# Patient Record
Sex: Male | Born: 1940 | Race: Black or African American | Hispanic: No | Marital: Married | Smoking: Never smoker
Health system: Southern US, Community
[De-identification: ages and names within clinical notes are randomized; demographics above are authoritative.]

## PROBLEM LIST (undated history)

## (undated) DIAGNOSIS — H548 Legal blindness, as defined in USA: Secondary | ICD-10-CM

## (undated) DIAGNOSIS — N400 Enlarged prostate without lower urinary tract symptoms: Secondary | ICD-10-CM

## (undated) DIAGNOSIS — I714 Abdominal aortic aneurysm, without rupture, unspecified: Secondary | ICD-10-CM

## (undated) DIAGNOSIS — Z9889 Other specified postprocedural states: Secondary | ICD-10-CM

## (undated) DIAGNOSIS — R195 Other fecal abnormalities: Secondary | ICD-10-CM

## (undated) DIAGNOSIS — Z87442 Personal history of urinary calculi: Secondary | ICD-10-CM

## (undated) HISTORY — DX: Legal blindness, as defined in USA: H54.8

## (undated) HISTORY — DX: Abdominal aortic aneurysm, without rupture, unspecified: I71.40

## (undated) HISTORY — DX: Other specified postprocedural states: Z98.890

---
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
## 2005-03-27 HISTORY — PX: CATARACT EXTRACTION, BILATERAL: SHX1313

## 2015-10-30 ENCOUNTER — Ambulatory Visit (INDEPENDENT_AMBULATORY_CARE_PROVIDER_SITE_OTHER): Payer: Medicare HMO | Admitting: Urgent Care

## 2015-10-30 VITALS — BP 122/68 | HR 60 | Temp 98.1°F | Resp 16 | Ht 71.0 in | Wt 218.0 lb

## 2015-10-30 DIAGNOSIS — L089 Local infection of the skin and subcutaneous tissue, unspecified: Secondary | ICD-10-CM

## 2015-10-30 DIAGNOSIS — L723 Sebaceous cyst: Secondary | ICD-10-CM | POA: Diagnosis not present

## 2015-10-30 MED ORDER — DOXYCYCLINE HYCLATE 100 MG PO CAPS: 100.0000 mg | ORAL_CAPSULE | Freq: Two times a day (BID) | ORAL | 0 refills | Status: DC

## 2015-10-30 NOTE — Patient Instructions (Addendum)
Sebaceous Cyst Removal, Care After Refer to this sheet in the next few weeks. These instructions provide you with information about caring for yourself after your procedure. Your health care provider may also give you more specific instructions. Your treatment has been planned according to current medical practices, but problems sometimes occur. Call your health care provider if you have any problems or questions after your procedure. WHAT TO EXPECT AFTER THE PROCEDURE After your procedure, it is common to have:  Soreness in the area where your cyst was removed.  Tightness or itching from your skin sutures. HOME CARE INSTRUCTIONS  Take medicines only as directed by your health care provider.  If you were prescribed an antibiotic medicine, finish all of it even if you start to feel better.  Use antibiotic ointment as directed by your health care provider. Follow the instructions carefully.  There are many different ways to close and cover an incision, including stitches (sutures), skin glue, and adhesive strips. Follow your health care provider's instructions about:  Incision care.  Bandage (dressing) changes and removal.  Incision closure removal.  Keep the bandage (dressing) dry until your health care provider says that it can be removed. Take sponge baths only. Ask your health care provider when you can start showering or taking a bath.  After your dressing is off, check your incision every day for signs of infection. Watch for:  Redness, swelling, or pain.  Fluid, blood, or pus.  You can return to your normal activities. Do not do anything that stretches or puts pressure on your incision.  You can return to your normal diet.  Keep all follow-up visits as directed by your health care provider. This is important. SEEK MEDICAL CARE IF:  You have a fever.  Your incision bleeds.  You have redness, swelling, or pain in the incision area.  You have fluid, blood, or pus coming  from your incision.  Your cyst comes back after surgery.   This information is not intended to replace advice given to you by your health care provider. Make sure you discuss any questions you have with your health care provider.   Document Released: 04/03/2014 Document Reviewed: 04/03/2014 Elsevier Interactive Patient Education [employer].     IF you received an x-ray today, you will receive an invoice from [facility]. Please contact [facility] at [phone] with questions or concerns regarding your invoice.   IF you received labwork today, you will receive an invoice from [employer]. Please contact [facility] at [phone] with questions or concerns regarding your invoice.   Our billing staff will not be able to assist you with questions regarding bills from these companies.  You will be contacted with the lab results as soon as they are available. The fastest way to get your results is to activate your My Chart account. Instructions are located on the last page of this paperwork. If you have not heard from [place] regarding the results in 2 weeks, please contact this office.

## 2015-10-30 NOTE — Progress Notes (Signed)
    MRN: [id number] DOB: [birth year]  Subjective:   Bryan Morgan is a 75 y.o. male presenting for chief complaint of Abscess (abscess on back about a month ago. Area is draining)  Reports ~2 year history of mass over his left back. He has been seen before for the same at a different practice. He was advised to monitor the mass. It has since steadily increased in size, became red 4 days ago, started draining. Denies fever, pain, bleeding.  Bryan Morgan currently has no medications in their medication list. Also has No Known Allergies.  Bryan Morgan  has no past medical history on file. Also  has a past surgical history that includes Eye surgery (2007).  Objective:   Vitals: BP 122/68   Pulse 60   Temp 98.1 F (36.7 C) (Oral)   Resp 16   Ht 5\' 11"  (1.803 m)   Wt 218 lb (98.9 kg)   SpO2 99%   BMI 30.40 kg/m   Physical Exam  Constitutional: He is oriented to person, place, and time. He appears well-developed and well-nourished.  Cardiovascular: Normal rate.   Pulmonary/Chest: Effort normal.  Neurological: He is alert and oriented to person, place, and time.  Skin:      PROCEDURE NOTE: sebaceous cyst excision Verbal consent obtained. Local anesthesia with 5cc of 1% lidocaine with epinephrine. Sterile prep and drape. An elliptical incision was made extending ~2.5cm using an 11 blade, copious amount of purulent and sebaceous material was expressed, wound explored with curved hemostats with only remnants of the sac left over. The wound depth was between 3-4cm. Wound was packed with 1/2" packing. Cleansed and dressed.   Assessment and Plan :   1. Infected sebaceous cyst - Incision and drainage performed today, start doxycycline. Wound care reviewed, rtc in 2 days for recheck. Discussed prognosis with patient, verbalized understanding.  Jaynee Eagles, PA-C [facility] [phone] 10/30/2015 10:27 AM

## 2015-11-01 ENCOUNTER — Ambulatory Visit (INDEPENDENT_AMBULATORY_CARE_PROVIDER_SITE_OTHER): Payer: Medicare HMO | Admitting: Urgent Care

## 2015-11-01 VITALS — BP 126/80 | HR 88 | Temp 98.6°F | Resp 18 | Ht 73.0 in | Wt 218.0 lb

## 2015-11-01 DIAGNOSIS — Z5189 Encounter for other specified aftercare: Secondary | ICD-10-CM

## 2015-11-01 DIAGNOSIS — L089 Local infection of the skin and subcutaneous tissue, unspecified: Secondary | ICD-10-CM

## 2015-11-01 DIAGNOSIS — L723 Sebaceous cyst: Secondary | ICD-10-CM

## 2015-11-01 NOTE — Progress Notes (Signed)
    MRN: [id number] DOB: 1940-08-13  Subjective:   Bryan Morgan is a 75 y.o. male presenting for follow up on infected sebaceous cyst.   Patient was last seen 10/30/2015, had sebaceous cyst removal, with packing. He was started on doxycycline and has done well with this medication. His pain has improved, denies additional drainage but they have not changed the dressing at home. Denies fever, swelling, back pain.  Bryan Morgan has a current medication list which includes the following prescription(s): doxycycline. Also has No Known Allergies.  Bryan Morgan  has no past medical history on file. Also  has a past surgical history that includes Eye surgery (2007).  Objective:   Vitals: BP 126/80 (BP Location: Right Arm, Patient Position: Sitting, Cuff Size: Normal)   Pulse 88   Temp 98.6 F (37 C)   Resp 18   Ht 6\' 1"  (1.854 m)   Wt 218 lb (98.9 kg)   SpO2 96%   BMI 28.76 kg/m   Physical Exam  Constitutional: He is oriented to person, place, and time. He appears well-developed and well-nourished.  Cardiovascular: Normal rate.   Pulmonary/Chest: Effort normal.  Neurological: He is alert and oriented to person, place, and time.  Skin:       WOUND CARE: Dressing removed with moderate amount of dressing over dressing. Packing had mixture of purulent material and sebaceous cyst remnants. There was slight drainage that was expressed. Wound was irrigated with ~20cc of sterile water. Repacked with 1/2" packing. Cleansed and dressed.  Assessment and Plan :   1. Encounter for wound care 2. Infected sebaceous cyst - Improved, f/u in 2 days. Patient is to have a family member change dressing for him at home as instructed.  Jaynee Eagles, PA-C [facility] [phone] 11/01/2015 5:49 PM

## 2015-11-01 NOTE — Patient Instructions (Addendum)
Dressing Change A dressing is a material placed over wounds. It keeps the wound clean, dry, and protected from further injury. This provides an environment that favors wound healing.  BEFORE YOU BEGIN  Get your supplies together. Things you may need include:  Saline solution.  Flexible gauze dressing.  Medicated cream.  Tape.  Gloves.  Abdominal dressing pads.  Gauze squares.  Plastic bags.  Take pain medicine 30 minutes before the dressing change if you need it.  Take a shower before you do the first dressing change of the day. Use plastic wrap or a plastic bag to prevent the dressing from getting wet. REMOVING YOUR OLD DRESSING   Wash your hands with soap and water. Dry your hands with a clean towel.  Put on your gloves.  Remove any tape.  Carefully remove the old dressing. If the dressing sticks, you may dampen it with warm water to loosen it, or follow your caregiver's specific directions.  Remove any gauze or packing tape that is in your wound.  Take off your gloves.  Put the gloves, tape, gauze, or any packing tape into a plastic bag. CHANGING YOUR DRESSING  Open the supplies.  Take the cap off the saline solution.  Open the gauze package so that the gauze remains on the inside of the package.  Put on your gloves.  Clean your wound as told by your caregiver.  If you have been told to keep your wound dry, follow those instructions.  Your caregiver may tell you to do one or more of the following:  Pick up the gauze. Pour the saline solution over the gauze. Squeeze out the extra saline solution.  Put medicated cream or other medicine on your wound if you have been told to do so.  Put the solution soaked gauze only in your wound, not on the skin around it.  Pack your wound loosely or as told by your caregiver.  Put dry gauze on your wound.  Put abdominal dressing pads over the dry gauze if your wet gauze soaks through.  Tape the abdominal dressing  pads in place so they will not fall off. Do not wrap the tape completely around the affected part (arm, leg, abdomen).  Wrap the dressing pads with a flexible gauze dressing to secure it in place.  Take off your gloves. Put them in the plastic bag with the old dressing. Tie the bag shut and throw it away.  Keep the dressing clean and dry until your next dressing change.  Wash your hands. SEEK MEDICAL CARE IF:  Your skin around the wound looks red.  Your wound feels more tender or sore.  You see pus in the wound.  Your wound smells bad.  You have a fever.  Your skin around the wound has a rash that itches and burns.  You see black or yellow skin in your wound that was not there before.  You feel nauseous, throw up, and feel very tired.   This information is not intended to replace advice given to you by your health care provider. Make sure you discuss any questions you have with your health care provider.   Document Released: 04/20/2004 Document Revised: 06/05/2011 Document Reviewed: 01/23/2011 Elsevier Interactive Patient Education 2016 [employer].     IF you received an x-ray today, you will receive an invoice from [facility]. Please contact [facility] at [phone] with questions or concerns regarding your invoice.   IF you received labwork today, you will receive  an Pharmacologist from [employer]. Please contact [facility] at [phone] with questions or concerns regarding your invoice.   Our billing staff will not be able to assist you with questions regarding bills from these companies.  You will be contacted with the lab results as soon as they are available. The fastest way to get your results is to activate your My Chart account. Instructions are located on the last page of this paperwork. If you have not heard from [place] regarding the results in 2 weeks, please contact this office.

## 2015-11-03 ENCOUNTER — Ambulatory Visit (INDEPENDENT_AMBULATORY_CARE_PROVIDER_SITE_OTHER): Payer: Medicare HMO | Admitting: Physician Assistant

## 2015-11-03 VITALS — BP 126/80 | HR 81 | Temp 98.2°F | Resp 17 | Ht 73.5 in | Wt 220.0 lb

## 2015-11-03 DIAGNOSIS — Z5189 Encounter for other specified aftercare: Secondary | ICD-10-CM

## 2015-11-03 DIAGNOSIS — L089 Local infection of the skin and subcutaneous tissue, unspecified: Secondary | ICD-10-CM

## 2015-11-03 DIAGNOSIS — L723 Sebaceous cyst: Secondary | ICD-10-CM

## 2015-11-03 DIAGNOSIS — Z4801 Encounter for change or removal of surgical wound dressing: Secondary | ICD-10-CM

## 2015-11-03 NOTE — Progress Notes (Signed)
[facility] [address] [place] [address] [phone]  By signing my name below I, Bryan Morgan, attest that this documentation has been prepared under the direction and in the presence of Ivar Drape PA. Electonically Signed. Bryan Morgan, Scribe 11/03/2015 at 5:28 PM  Date:  11/03/2015   Name:  Bryan Morgan   DOB:  [birth year]   MRN:  [id number]  PCP:  No primary care provider on file.    History of Present Illness:  Bryan Morgan is a 75 y.o. male patient who presents to [facility] for follow up of an infected sebaceous cyst. Pt was initially seen 4 days ago, complaining of an abscess at that time. Had sebaceous cyst removal with packing, started on doxycycline. Last follow up was 2 days ago. Pain was improved at that visit. Wound was repacked.     Pt has been taking doxycycline everyday, twice a day. His wife has been helping him change his bandages, last changed last night. No drainage noted. Pt denies fever and chills.  There are no active problems to display for this patient.   No past medical history on file.  Past Surgical History:  Procedure Laterality Date   EYE SURGERY  2007    Social History  Substance Use Topics   Smoking status: Never Smoker   Smokeless tobacco: Never Used   Alcohol use No    No family history on file.  No Known Allergies  Medication list has been reviewed and updated.  Current Outpatient Prescriptions on File Prior to Visit  Medication Sig Dispense Refill   doxycycline (VIBRAMYCIN) 100 MG capsule Take 1 capsule (100 mg total) by mouth 2 (two) times daily. 20 capsule 0   No current facility-administered medications on file prior to visit.     ROS ROS unremarkable unless otherwise specified.  Physical Examination: BP 126/80 (BP Location: Right Arm, Patient Position: Sitting, Cuff Size: Normal)    Pulse 81    Temp 98.2 F (36.8 C) (Oral)    Resp 17    Ht 6' 1.5" (1.867 m)    Wt 220 lb (99.8 kg)     SpO2 96%    BMI 28.63 kg/m  Ideal Body Weight: @FLOWAMB [id number]  Physical Exam  Constitutional: He is oriented to person, place, and time. He appears well-developed and well-nourished. No distress.  HENT:  Head: Normocephalic and atraumatic.  Eyes: Conjunctivae and EOM are normal. Pupils are equal, round, and reactive to light.  Cardiovascular: Normal rate.   Pulmonary/Chest: Effort normal. No respiratory distress.  Neurological: He is alert and oriented to person, place, and time.  Skin: Skin is warm and dry. He is not diaphoretic.  Incision site with mild localized erythema.  Wound without necrosis.  Sanguinous fluid only.  Depth of wound is a little less than 1.5 cm.    Psychiatric: He has a normal mood and affect. His behavior is normal.   Wound irrigated with normal saline.  1/2 packing gently and dressings applied.  Assessment and Plan: Bryan Morgan is a 75 y.o. male who is here today.  He will continue the doxy.  Advised to return in 3 days for follow up.  Encounter for wound care  Infected sebaceous cyst    Ivar Drape, PA-C [facility] 11/03/2015 5:28 PM

## 2015-11-03 NOTE — Patient Instructions (Addendum)
Continue the doxycycline.   Change the dressing daily.  Return in 3 days for wound care.  IF you received an x-ray today, you will receive an invoice from [facility]. Please contact [facility] at [phone] with questions or concerns regarding your invoice.   IF you received labwork today, you will receive an invoice from [employer]. Please contact [facility] at [phone] with questions or concerns regarding your invoice.   Our billing staff will not be able to assist you with questions regarding bills from these companies.  You will be contacted with the lab results as soon as they are available. The fastest way to get your results is to activate your My Chart account. Instructions are located on the last page of this paperwork. If you have not heard from [place] regarding the results in 2 weeks, please contact this office.

## 2015-11-06 ENCOUNTER — Ambulatory Visit (INDEPENDENT_AMBULATORY_CARE_PROVIDER_SITE_OTHER): Payer: Medicare HMO | Admitting: Physician Assistant

## 2015-11-06 VITALS — BP 110/60 | HR 63 | Temp 97.5°F | Resp 18 | Ht 73.0 in | Wt 219.0 lb

## 2015-11-06 DIAGNOSIS — Z5189 Encounter for other specified aftercare: Secondary | ICD-10-CM

## 2015-11-06 DIAGNOSIS — Z4800 Encounter for change or removal of nonsurgical wound dressing: Secondary | ICD-10-CM

## 2015-11-06 NOTE — Progress Notes (Signed)
   11/06/2015 8:24 AM   DOB: 1940-08-26 / MRN: [id number]  SUBJECTIVE:  Mr. Arritt is a well appearing 75 y.o. male here today for wound care. He denies exquisite tenderness at the site of the wound, nausea, emesis, fever and chills.  He has been compliant with medical therapy and recommendations thus far.   He has No Known Allergies.   He  has no past medical history on file.    He  reports that he has never smoked. He has never used smokeless tobacco. He reports that he does not drink alcohol or use drugs. He  reports that he does not engage in sexual activity. The patient  has a past surgical history that includes Eye surgery (2007).  His family history is not on file.  ROS  Per HPI  Problem list and medications reviewed and updated by myself where necessary, and exist elsewhere in the encounter.   OBJECTIVE:  BP 110/60 (BP Location: Left Arm, Patient Position: Sitting, Cuff Size: Large)   Pulse 63   Temp 97.5 F (36.4 C) (Oral)   Resp 18   Ht 6\' 1"  (1.854 m)   Wt 219 lb (99.3 kg)   SpO2 98%   BMI 28.89 kg/m  CrCl cannot be calculated (No order found.).  Physical Exam  Constitutional: Vital signs are normal.      No results found for this or any previous visit (from the past 48 hour(s)).  ASSESSMENT AND PLAN  Gavyn was seen today for wound check.  Diagnoses and all orders for this visit:  Encounter for wound care: Advised he pull packing in two days, finish ABx and RTC PRN.  He is amenable to this plan.       The patient was advised to call or return to clinic if he does not see an improvement in symptoms or to seek the care of the closest emergency department if he worsens with the above plan.   Philis Fendt, MHS, PA-C [facility] 11/06/2015 8:24 AM

## 2015-11-06 NOTE — Patient Instructions (Signed)
     IF you received an x-ray today, you will receive an invoice from [facility]. Please contact  Radiology at [phone] with questions or concerns regarding your invoice.   IF you received labwork today, you will receive an invoice from [facility]. Please contact [facility] at [phone] with questions or concerns regarding your invoice.   Our billing staff will not be able to assist you with questions regarding bills from these companies.  You will be contacted with the lab results as soon as they are available. The fastest way to get your results is to activate your My Chart account. Instructions are located on the last page of this paperwork. If you have not heard from us regarding the results in 2 weeks, please contact this office.      

## 2016-01-01 ENCOUNTER — Ambulatory Visit (INDEPENDENT_AMBULATORY_CARE_PROVIDER_SITE_OTHER): Payer: Medicare HMO | Admitting: Physician Assistant

## 2016-01-01 VITALS — BP 128/80 | HR 74 | Temp 98.3°F | Resp 17 | Ht 73.0 in | Wt 218.0 lb

## 2016-01-01 DIAGNOSIS — S21202D Unspecified open wound of left back wall of thorax without penetration into thoracic cavity, subsequent encounter: Secondary | ICD-10-CM

## 2016-01-01 DIAGNOSIS — L929 Granulomatous disorder of the skin and subcutaneous tissue, unspecified: Secondary | ICD-10-CM

## 2016-01-01 MED ORDER — DOXYCYCLINE HYCLATE 100 MG PO CAPS: 100.0000 mg | ORAL_CAPSULE | Freq: Two times a day (BID) | ORAL | 0 refills | Status: DC

## 2016-01-01 NOTE — Patient Instructions (Addendum)
  Follow up in one week.  Take antibiotics as prescribed.  If you develop fever, chills, or any concerning symptoms, follow up sooner.    IF you received an x-ray today, you will receive an invoice from [facility]. Please contact [facility] at [phone] with questions or concerns regarding your invoice.   IF you received labwork today, you will receive an invoice from [employer]. Please contact [facility] at [phone] with questions or concerns regarding your invoice.   Our billing staff will not be able to assist you with questions regarding bills from these companies.  You will be contacted with the lab results as soon as they are available. The fastest way to get your results is to activate your My Chart account. Instructions are located on the last page of this paperwork. If you have not heard from [place] regarding the results in 2 weeks, please contact this office.

## 2016-01-01 NOTE — Progress Notes (Signed)
    MRN: [id number] DOB: May 07, 1940  Subjective:   Bryan Morgan is a 75 y.o. male presenting for follow up on wound. Initially seen on 10/30/15 for infected sebaceous cyst I&D. He then followed up for three visits for wound repacking. Notes that two days after his last visit for packing removal on 11/06/15, the wound cavity started bleeding and having pus drainage and has slowly kept draining since this visit.  He denies fever, chills, diaphoresis, or pain.   Bryan Morgan currently has no medications in their medication list. Also has No Known Allergies.  Bryan Morgan  has no past medical history on file. Also  has a past surgical history that includes Eye surgery (2007).  Objective:   Vitals: BP 128/80 (BP Location: Right Arm, Patient Position: Sitting, Cuff Size: Normal)   Pulse 74   Temp 98.3 F (36.8 C) (Oral)   Resp 17   Ht 6\' 1"  (1.854 m)   Wt 218 lb (98.9 kg)   SpO2 98%   BMI 28.76 kg/m   Physical Exam  Constitutional: He is oriented to person, place, and time. He appears well-developed and well-nourished.  HENT:  Head: Normocephalic and atraumatic.  Eyes: Conjunctivae are normal.  Neck: Normal range of motion.  Pulmonary/Chest: Effort normal.  Neurological: He is alert and oriented to person, place, and time.  Skin: Skin is warm and dry.     Psychiatric: He has a normal mood and affect.  Vitals reviewed.   Wound Care:  Consent obtained from patient. Area cleansed with alcohol pad. 1.5cc of 1% lidocaine without epinephrine used for local anesthesia. Granulation tissue cauterized. Curette used to debride remaining tissue above epidermis.Area cauterized once more. Wound edges were disrupted using curette to allow for proper healing. Area cleansed and dressed. Patient tolerated well.    No results found for this or any previous visit (from the past 24 hour(s)).  Assessment and Plan :  1. Granulation tissue -Debrided and cauterized  2. Wound of left side of back, subsequent  encounter - doxycycline (VIBRAMYCIN) 100 MG capsule; Take 1 capsule (100 mg total) by mouth 2 (two) times daily.  Dispense: 20 capsule; Refill: 0 - WOUND CULTURE -Pt to return in one week for reevaluation -Wound care instructions given  A total of 25 minutes was spent in the room with the patient, greater than 50% of which was spent in wound care.    Tenna Delaine, PA-C  [facility] 01/01/2016 8:07 AM

## 2016-01-03 LAB — WOUND CULTURE
Gram Stain: NONE SEEN
ORGANISM ID, BACTERIA: NO GROWTH

## 2016-01-05 NOTE — Progress Notes (Signed)
Message left

## 2016-01-08 ENCOUNTER — Ambulatory Visit (INDEPENDENT_AMBULATORY_CARE_PROVIDER_SITE_OTHER): Payer: Medicare HMO | Admitting: Physician Assistant

## 2016-01-08 DIAGNOSIS — Z5189 Encounter for other specified aftercare: Secondary | ICD-10-CM

## 2016-01-08 NOTE — Progress Notes (Signed)
Patient ID: Bryan Morgan, male   DOB: 1940-06-19, 75 y.o.   MRN: [id number] [facility] [address], [place] [address] [phone]  By signing my name below, I, Bryan Morgan, attest that this documentation has been prepared under the direction and in the presence of Bryan Drape, PA-C Electronically Signed: Ladene Artist, ED Scribe 01/08/2016 at 8:10 AM.   Date:  01/08/2016   Name:  BREYDON Morgan   DOB:  08-19-1940   MRN:  [id number]  PCP:  No primary care provider on file.   History of Present Illness:  Bryan Morgan is a 75 y.o. male patient who [facility] to [facility] for a wound check. Pt was seen on 10/30/15 for an infected sebaceous cyst on the left back. It was repacked for 7 days. At the last visit 1 week ago, pt stated that for 1.5 month he encountered  beleding and pus-drainage. Wound was found to have granulation tissue and it was debrided and cauterized. He returns for f/u. Today, pt states that he has been cleaning the area with soap and water. He denies pain at this time.   There are no active problems to display for this patient.   No past medical history on file.  Past Surgical History:  Procedure Laterality Date   EYE SURGERY  2007    Social History  Substance Use Topics   Smoking status: Never Smoker   Smokeless tobacco: Never Used   Alcohol use No    No family history on file.  No Known Allergies  Medication list has been reviewed and updated.  Current Outpatient Prescriptions on File Prior to Visit  Medication Sig Dispense Refill   doxycycline (VIBRAMYCIN) 100 MG capsule Take 1 capsule (100 mg total) by mouth 2 (two) times daily. 20 capsule 0   No current facility-administered medications on file prior to visit.     Review of Systems  Skin:       +wound to L back    Physical Examination: There were no vitals taken for this visit. Ideal Body Weight: @FLOWAMB [id number]  Physical Exam   Constitutional: He is oriented to person, place, and time. He appears well-developed and well-nourished. No distress.  HENT:  Head: Normocephalic and atraumatic.  Eyes: Conjunctivae and EOM are normal. Pupils are equal, round, and reactive to light.  Cardiovascular: Normal rate.   Pulmonary/Chest: Effort normal. No respiratory distress.  Neurological: He is alert and oriented to person, place, and time.  Skin: Skin is warm and dry. He is not diaphoretic.  Dried non-erythematous area without exudate. No tenderness.   Psychiatric: He has a normal mood and affect. His behavior is normal.    Assessment and Plan: [name] is a 75 y.o. male who is here today for wound care.  This is healing well.  Advised to clean daily and he can remove bandaging once it fully scabs over. rtc as needed.  Encounter for wound care  Bryan Drape, PA-C [facility] 10/18/20174:37 PM I personally performed the services described in this documentation, which was scribed in my presence. The recorded information has been reviewed and is accurate.

## 2016-01-12 ENCOUNTER — Encounter: Payer: Self-pay | Admitting: Physician Assistant

## 2018-07-31 DIAGNOSIS — E663 Overweight: Secondary | ICD-10-CM | POA: Insufficient documentation

## 2018-08-01 NOTE — Progress Notes (Deleted)
ENCOUNTER TO ESTABLISH CARE AND MEDICARE ANNUAL WELLNESS VISIT  Assessment:   Diagnoses and all orders for this visit:  Encounter to establish care with new doctor  Encounter for Medicare annual wellness exam  Overweight (BMI 25.0-29.9)     Over 30 minutes of exam, counseling, chart review, and critical decision making was performed  Future Appointments  Date Time Provider [place]  08/05/2018  9:30 AM Liane Comber, NP [facility] None     Plan:   During the course of the visit the patient was educated and counseled about appropriate screening and preventive services including:    Pneumococcal vaccine   Influenza vaccine  Prevnar 13  Td vaccine  Screening electrocardiogram  Colorectal cancer screening  Diabetes screening  Glaucoma screening  Nutrition counseling    Subjective:  Bryan Morgan is a 78 y.o. male who presents for to establish care and for Medicare Annual Wellness Visit.  CPE with Dr. Jerilynn Mages 6 months? ***  BMI is There is no height or weight on file to calculate BMI., he {HAS HAS GLO:75643} been working on diet and exercise. Wt Readings from Last 3 Encounters:  01/01/16 218 lb (98.9 kg)  11/06/15 219 lb (99.3 kg)  11/03/15 220 lb (99.8 kg)   His blood pressure {HAS HAS NOT:18834} been controlled at home, today their BP is   He {DOES_DOES PIR:51884} workout. He denies chest pain, shortness of breath, dizziness.   He {ACTION; IS/IS ZYS:06301601} on cholesterol medication and denies myalgias. His cholesterol {ACTION; IS/IS NOT:21021397} at goal. The cholesterol last visit was:  No results found for: CHOL, HDL, LDLCALC, LDLDIRECT, TRIG, CHOLHDL He {Has/has not:18111} been working on diet and exercise for ***prediabetes, and denies {Symptoms; diabetes w/o none:19199}. Last A1C in the office was: No results found for: HGBA1C Last GFR No results found for: GFRNONAA  No results found for: GFRAA Patient is on Vitamin D supplement.   No  results found for: VD25OH    Medication Review:       Current Outpatient Medications (Other):  .  doxycycline (VIBRAMYCIN) 100 MG capsule, Take 1 capsule (100 mg total) by mouth 2 (two) times daily.  Allergies: No Known Allergies  Current Problems (verified) has Overweight (BMI 25.0-29.9) on their problem list.  Screening Tests  There is no immunization history on file for this patient.  Preventative care: Last colonoscopy: ***  Prior vaccinations: TD or Tdap: ***  Influenza: ***  Pneumococcal: *** Prevnar13:  Shingles/Zostavax: ***  Names of Other Physician/Practitioners you currently use: 1. [facility] here for primary care 2. ***, eye doctor, last visit  3. ***, dentist, last visit  Patient Care Team: Unk Pinto, MD as PCP - General (Internal Medicine)  Surgical: He  has a past surgical history that includes Eye surgery (2007). Family His family history is not on file. Social history  He reports that he has never smoked. He has never used smokeless tobacco. He reports that he does not drink alcohol or use drugs.  MEDICARE WELLNESS OBJECTIVES: Physical activity:   Cardiac risk factors:   Depression/mood screen:   Depression screen [place] 2/9 01/01/2016  Decreased Interest 0  Down, Depressed, Hopeless 0  PHQ - 2 Score 0    ADLs:  No flowsheet data found.   Cognitive Testing  Alert? Yes  Normal Appearance?Yes  Oriented to person? Yes  Place? Yes   Time? Yes  Recall of three objects?  Yes  Can perform simple calculations? Yes  Displays appropriate  judgment?Yes  Can read the correct time from a watch face?Yes  EOL planning:     Objective:   There were no vitals filed for this visit. There is no height or weight on file to calculate BMI.  General appearance: alert, no distress, WD/WN, male HEENT: normocephalic, sclerae anicteric, TMs pearly, nares patent, no discharge or erythema, pharynx normal Oral  cavity: MMM, no lesions Neck: supple, no lymphadenopathy, no thyromegaly, no masses Heart: RRR, normal S1, S2, no murmurs Lungs: CTA bilaterally, no wheezes, rhonchi, or rales Abdomen: +bs, soft, non tender, non distended, no masses, no hepatomegaly, no splenomegaly Musculoskeletal: nontender, no swelling, no obvious deformity Extremities: no edema, no cyanosis, no clubbing Pulses: 2+ symmetric, upper and lower extremities, normal cap refill Neurological: alert, oriented x 3, CN2-12 intact, strength normal upper extremities and lower extremities, sensation normal throughout, DTRs 2+ throughout, no cerebellar signs, gait normal Psychiatric: normal affect, behavior normal, pleasant   Medicare Attestation I have personally reviewed: The patient's medical and social history Their use of alcohol, tobacco or illicit drugs Their current medications and supplements The patient's functional ability including ADLs,fall risks, home safety risks, cognitive, and hearing and visual impairment Diet and physical activities Evidence for depression or mood disorders  The patient's weight, height, BMI, and visual acuity have been recorded in the chart.  I have made referrals, counseling, and provided education to the patient based on review of the above and I have provided the patient with a written personalized care plan for preventive services.     Izora Ribas, NP   08/01/2018

## 2018-08-05 ENCOUNTER — Ambulatory Visit: Payer: Medicare HMO | Admitting: Adult Health

## 2018-08-05 ENCOUNTER — Other Ambulatory Visit: Payer: Self-pay

## 2018-08-05 ENCOUNTER — Encounter: Payer: Self-pay | Admitting: Adult Health

## 2018-08-05 VITALS — BP 118/76 | HR 80 | Temp 97.7°F | Ht 73.0 in | Wt 224.8 lb

## 2018-08-05 DIAGNOSIS — R7309 Other abnormal glucose: Secondary | ICD-10-CM | POA: Diagnosis not present

## 2018-08-05 DIAGNOSIS — E559 Vitamin D deficiency, unspecified: Secondary | ICD-10-CM

## 2018-08-05 DIAGNOSIS — R972 Elevated prostate specific antigen [PSA]: Secondary | ICD-10-CM | POA: Insufficient documentation

## 2018-08-05 DIAGNOSIS — E785 Hyperlipidemia, unspecified: Secondary | ICD-10-CM | POA: Diagnosis not present

## 2018-08-05 DIAGNOSIS — Z1211 Encounter for screening for malignant neoplasm of colon: Secondary | ICD-10-CM

## 2018-08-05 DIAGNOSIS — R6889 Other general symptoms and signs: Secondary | ICD-10-CM | POA: Diagnosis not present

## 2018-08-05 DIAGNOSIS — Z79899 Other long term (current) drug therapy: Secondary | ICD-10-CM

## 2018-08-05 DIAGNOSIS — L989 Disorder of the skin and subcutaneous tissue, unspecified: Secondary | ICD-10-CM

## 2018-08-05 DIAGNOSIS — Z0001 Encounter for general adult medical examination with abnormal findings: Secondary | ICD-10-CM

## 2018-08-05 DIAGNOSIS — E663 Overweight: Secondary | ICD-10-CM

## 2018-08-05 DIAGNOSIS — Z7689 Persons encountering health services in other specified circumstances: Secondary | ICD-10-CM

## 2018-08-05 DIAGNOSIS — N400 Enlarged prostate without lower urinary tract symptoms: Secondary | ICD-10-CM | POA: Diagnosis not present

## 2018-08-05 DIAGNOSIS — K219 Gastro-esophageal reflux disease without esophagitis: Secondary | ICD-10-CM

## 2018-08-05 DIAGNOSIS — Z0000 Encounter for general adult medical examination without abnormal findings: Secondary | ICD-10-CM

## 2018-08-05 DIAGNOSIS — Z87898 Personal history of other specified conditions: Secondary | ICD-10-CM

## 2018-08-05 NOTE — Progress Notes (Addendum)
ENCOUNTER TO ESTABLISH CARE AND MEDICARE ANNUAL WELLNESS VISIT  Assessment:   [name] was seen today for establish care and medicare wellness.  Diagnoses and all orders for this visit:  Encounter for Medicare annual wellness exam Due annually   Encounter to establish care with new doctor  Benign prostatic hyperplasia without lower urinary tract symptoms Mild intermittent sx; monitor   History of elevated PSA Biopsy neg; monitored by urology x 8 years and released Monitor PSA at CPE with DRE  Medication management -     CBC with Differential/Platelet -     COMPLETE METABOLIC PANEL WITH GFR -     Magnesium -     Urinalysis, Routine w reflex microscopic  Hyperlipidemia, unspecified hyperlipidemia type -     Lipid panel -     TSH  Other abnormal glucose -     Hemoglobin A1c  Vitamin D deficiency -     VITAMIN D 25 Hydroxy (Vit-D Deficiency, Fractures)  Screening for colon cancer -     Cologuard  Overweight (BMI 25.0-29.9) Long discussion about weight loss, diet, and exercise Recommended diet heavy in fruits and veggies and low in animal meats, cheeses, and dairy products, appropriate calorie intake Patient will work on portions  Discussed appropriate weight for height and initial goal (<210lb) Follow up at next visit  Skin lesion of hand Concerning for squamous cell; Dr. Melford Aase reviewed and will schedule for removal in the next few weeks  GERD Well managed on current medications; PRN tums after problematic meals, 2-3 weekly Discussed diet, avoiding triggers and other lifestyle changes   Over 30 minutes of exam, counseling, chart review, and critical decision making was performed  No future appointments.   Plan:   During the course of the visit the patient was educated and counseled about appropriate screening and preventive services including:    Pneumococcal vaccine   Influenza vaccine  Prevnar 13  Td vaccine  Screening  electrocardiogram  Colorectal cancer screening  Diabetes screening  Glaucoma screening  Nutrition counseling    Subjective:  Bryan Morgan is a 78 y.o. male who presents for to establish care and for Medicare Annual Wellness Visit.   His wife comes to our office Bryan Morgan) - he is preveiously not well established with PCP, has gone to urgent care as needed.   He is married with 4 grown children, 4th is adopted. 3 grandchildren, 1 great grand.   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   He does have hx of PSA elevations and was followed by urology, underwent biopsy which was negative, was followed for 8 years without changes and has been released.   He has skin lesion to L hand, approx 1 cm, unresolved for 2-3 months. He denies any derm hx; he has albino coloring; per wife he has had some spots "burned."   he has a diagnosis of GERD which is currently managed by PRN tums with spicy foods.  he reports symptoms is currently well controlled, and denies breakthrough reflux, burning in chest, hoarseness or cough.    BMI is Body mass index is 29.66 kg/m., he bowls once a week, he reports he walks a lot in his neighborhood. He rides the bus and walks a lot for transportation due to vision, generally very active at work.   Wt  Readings from Last 3 Encounters:  08/05/18 224 lb 12.8 oz (102 kg)  01/01/16 218 lb (98.9 kg)  11/06/15 219 lb (99.3 kg)   He denies hx of hypertension, today their BP is BP: 118/76 He does workout. He denies chest pain, shortness of breath, dizziness.   He has never had cholesterol checked.   He reports has been screened for diabetes; reported.   No results found for: VD25OH    Medication Review:       Current Outpatient Medications (Other):  .  doxycycline  (VIBRAMYCIN) 100 MG capsule, Take 1 capsule (100 mg total) by mouth 2 (two) times daily.  Allergies: No Known Allergies  Current Problems (verified) has Overweight (BMI 25.0-29.9); Benign prostatic hyperplasia without lower urinary tract symptoms; History of elevated PSA; and Skin lesion of hand on their problem list.  Screening Tests  There is no immunization history on file for this patient.  Preventative care: Last colonoscopy: never,   Prior vaccinations: TD or Tdap: never, declines today  Influenza: declines  Pneumococcal: declines Prevnar13: declines Shingles/Zostavax: declines  Names of Other Physician/Practitioners you currently use: 1. [facility] here for primary care 2. Dr. Marland Kitchen [place] at [facility], eye doctor, last visit 2017, has glasses, overdue for follow up 3. [facility], dentist, has partials and bridges, last visit 2019, goes q23m  Patient Care Team: Unk Pinto, MD as PCP - General (Internal Medicine)  Surgical: He  has a past surgical history that includes Cataract extraction, bilateral (2007). Family His family history includes COPD in his brother; Diabetes in his sister and sister. Social history  He reports that he has never smoked. He has never used smokeless tobacco. He reports that he does not drink alcohol or use drugs.  MEDICARE WELLNESS OBJECTIVES: Physical activity: Current Exercise Habits: The patient has a physically strenuous job, but has no regular exercise apart from work.(walks everywhere), Exercise limited by: None identified Cardiac risk factors: Cardiac Risk Factors include: advanced age (>23men, >42 women);male gender Depression/mood screen:   Depression screen [facility] 2/9 08/05/2018  Decreased Interest 0  Down, Depressed, Hopeless 0  PHQ - 2 Score 0    ADLs:  In your present state of health, do you have any difficulty performing the following activities: 08/05/2018  Hearing? Y   Comment has bilateral hearing aids, forgot to wear today, hearing is fair without  Vision? Y  Comment legally blind, very poor vision but can track activity, good mobility  Difficulty concentrating or making decisions? N  Walking or climbing stairs? N  Dressing or bathing? N  Doing errands, shopping? N  Comment can utilize public transportation, wife can drive if needed  Conservation officer, nature and eating ? N  Using the Toilet? N  In the past six months, have you accidently leaked urine? N  Do you have problems with loss of bowel control? N  Managing your Medications? N  Managing your Finances? N  Housekeeping or managing your Housekeeping? N  Some recent data might be hidden     Cognitive Testing  Alert? Yes  Normal Appearance?Yes  Oriented to person? Yes  Place? Yes   Time? Yes  Recall of three objects?  Yes  Can perform simple calculations? Yes  Displays appropriate judgment?Yes  Can read the correct time from a watch face?Yes  EOL planning: Does Patient Have a Medical Advance Directive?: Yes Type of Advance Directive: Healthcare Power of Attorney, Living will Does patient want to make changes to medical  advance directive?: No - Patient declined Copy of [place] in Chart?: No - copy requested   Objective:   Today's Vitals   08/05/18 0922  BP: 118/76  Pulse: 80  Temp: 97.7 F (36.5 C)  SpO2: 97%  Weight: 224 lb 12.8 oz (102 kg)  Height: 6\' 1"  (1.854 m)   Body mass index is 29.66 kg/m.  General appearance: alert, no distress, WD/WN, male HEENT: normocephalic, sclerae anicteric, TMs pearly, nares patent, no discharge or erythema, pharynx normal, severely poor vision, R pupil is non-reactive/torn, horizontal nystagmus with attempts to focus Oral cavity: MMM, no lesions Neck: supple, no lymphadenopathy, no thyromegaly, no masses Heart: RRR, normal S1, S2, no murmurs Lungs: CTA bilaterally, no wheezes, rhonchi, or rales Abdomen: +bs, soft, non tender,  non distended, no masses, no hepatomegaly, no splenomegaly Musculoskeletal: nontender, no swelling, no obvious deformity Extremities: no edema, no cyanosis, no clubbing Pulses: 2+ symmetric, upper and lower extremities, normal cap refill Neurological: alert, oriented x 3, CN2-12 intact (excepting vision testing deferred), strength normal upper extremities and lower extremities, sensation normal throughout, DTRs 2+ throughout, no cerebellar signs, gait normal (slow steady) Psychiatric: normal affect, behavior normal, pleasant  Skin: ~1cm lesion to left hand, raised,   Medicare Attestation I have personally reviewed: The patient's medical and social history Their use of alcohol, tobacco or illicit drugs Their current medications and supplements The patient's functional ability including ADLs,fall risks, home safety risks, cognitive, and hearing and visual impairment Diet and physical activities Evidence for depression or mood disorders  The patient's weight, height, BMI, and visual acuity have been recorded in the chart.  I have made referrals, counseling, and provided education to the patient based on review of the above and I have provided the patient with a written personalized care plan for preventive services.     Izora Ribas, NP   08/05/2018

## 2018-08-05 NOTE — Patient Instructions (Signed)
  [name] , Thank you for taking time to come for your Medicare Wellness Visit. I appreciate your ongoing commitment to your health goals. Please review the following plan we discussed and let me know if I can assist you in the future.   These are the goals we discussed: Goals    . Weight (lb) < 210 lb (95.3 kg)       This is a list of the screening recommended for you and due dates:  Health Maintenance  Topic Date Due  . Tetanus Vaccine  08/05/2019*  . Pneumonia vaccines (1 of 2 - PCV13) 08/05/2019*  . Flu Shot  10/26/2018  *Topic was postponed. The date shown is not the original due date.    Drink 1/2 your body weight in fluid ounces of water daily; drink a tall glass of water 30 min before meals  Don't eat until you're stuffed- listen to your stomach and eat until you are 80% full   Try eating off of a salad plate; wait 10 min after finishing before going back for seconds  Start by eating the vegetables on your plate; aim for 50% of your meals to be fruits or vegetables  Then eat your protein - lean meats (grass fed if possible), fish, beans, nuts in moderation  Eat your carbs/starch last ONLY if you still are hungry. If you can, stop before finishing it all  Avoid sugar and flour - the closer it looks to it's original form in nature, typically the better it is for you  Splurge in moderation - "assign" days when you get to splurge and have the "bad stuff" - I like to follow a 80% - 20% plan- "good" choices 80 % of the time, "bad" choices in moderation 20% of the time  Simple equation is: Calories out > calories in = weight loss - even if you eat the bad stuff, if you limit portions, you will still lose weight        When it comes to diets, agreement about the perfect plan isn't easy to find, even among the experts. Experts at the [employer] developed an idea known as the Healthy Eating Plate. Just imagine a plate divided into logical, healthy  portions.  The emphasis is on diet quality:  Load up on vegetables and fruits - one-half of your plate: Aim for color and variety, and remember that potatoes don't count.  Go for whole grains - one-quarter of your plate: Whole wheat, barley, wheat berries, quinoa, oats, brown rice, and foods made with them. If you want pasta, go with whole wheat pasta.  Protein power - one-quarter of your plate: Fish, chicken, beans, and nuts are all healthy, versatile protein sources. Limit red meat.  The diet, however, does go beyond the plate, offering a few other suggestions.  Use healthy plant oils, such as olive, canola, soy, corn, sunflower and peanut. Check the labels, and avoid partially hydrogenated oil, which have unhealthy trans fats.  If you're thirsty, drink water. Coffee and tea are good in moderation, but skip sugary drinks and limit milk and dairy products to one or two daily servings.  The type of carbohydrate in the diet is more important than the amount. Some sources of carbohydrates, such as vegetables, fruits, whole grains, and beans-are healthier than others.  Finally, stay active.

## 2018-08-06 ENCOUNTER — Encounter: Payer: Self-pay | Admitting: Adult Health

## 2018-08-06 DIAGNOSIS — E119 Type 2 diabetes mellitus without complications: Secondary | ICD-10-CM | POA: Insufficient documentation

## 2018-08-06 DIAGNOSIS — E785 Hyperlipidemia, unspecified: Secondary | ICD-10-CM | POA: Insufficient documentation

## 2018-08-06 DIAGNOSIS — N183 Chronic kidney disease, stage 3 (moderate): Secondary | ICD-10-CM

## 2018-08-06 DIAGNOSIS — E559 Vitamin D deficiency, unspecified: Secondary | ICD-10-CM | POA: Insufficient documentation

## 2018-08-06 DIAGNOSIS — E1169 Type 2 diabetes mellitus with other specified complication: Secondary | ICD-10-CM | POA: Insufficient documentation

## 2018-08-06 DIAGNOSIS — R7309 Other abnormal glucose: Secondary | ICD-10-CM

## 2018-08-06 LAB — TSH: TSH: 4.65 mIU/L — ABNORMAL HIGH (ref 0.40–4.50)

## 2018-08-06 LAB — COMPLETE METABOLIC PANEL WITH GFR
AG Ratio: 1.6 (calc) (ref 1.0–2.5)
ALT: 16 U/L (ref 9–46)
AST: 17 U/L (ref 10–35)
Albumin: 4.5 g/dL (ref 3.6–5.1)
Alkaline phosphatase (APISO): 50 U/L (ref 35–144)
BUN/Creatinine Ratio: 15 (calc) (ref 6–22)
BUN: 18 mg/dL (ref 7–25)
CO2: 32 mmol/L (ref 20–32)
Calcium: 10.2 mg/dL (ref 8.6–10.3)
Chloride: 101 mmol/L (ref 98–110)
Creat: 1.19 mg/dL — ABNORMAL HIGH (ref 0.70–1.18)
GFR, Est African American: 67 mL/min/{1.73_m2} (ref >=60)
GFR, Est Non African American: 58 mL/min/{1.73_m2} — ABNORMAL LOW (ref >=60)
Globulin: 2.8 g/dL (calc) (ref 1.9–3.7)
Glucose, Bld: 87 mg/dL (ref 65–99)
Potassium: 4.4 mmol/L (ref 3.5–5.3)
Sodium: 141 mmol/L (ref 135–146)
Total Bilirubin: 0.5 mg/dL (ref 0.2–1.2)
Total Protein: 7.3 g/dL (ref 6.1–8.1)

## 2018-08-06 LAB — HEMOGLOBIN A1C
Hgb A1c MFr Bld: 6.6 % of total Hgb — ABNORMAL HIGH (ref <5.7)
Mean Plasma Glucose: 143 (calc)
eAG (mmol/L): 7.9 (calc)

## 2018-08-06 LAB — CBC WITH DIFFERENTIAL/PLATELET
Absolute Monocytes: 456 cells/uL (ref 200–950)
Basophils Absolute: 19 cells/uL (ref 0–200)
Basophils Relative: 0.4 %
Eosinophils Absolute: 53 cells/uL (ref 15–500)
Eosinophils Relative: 1.1 %
HCT: 42.1 % (ref 38.5–50.0)
Hemoglobin: 14.2 g/dL (ref 13.2–17.1)
Lymphs Abs: 1526 cells/uL (ref 850–3900)
MCH: 29.6 pg (ref 27.0–33.0)
MCHC: 33.7 g/dL (ref 32.0–36.0)
MCV: 87.9 fL (ref 80.0–100.0)
MPV: 11.2 fL (ref 7.5–12.5)
Monocytes Relative: 9.5 %
Neutro Abs: 2746 cells/uL (ref 1500–7800)
Neutrophils Relative %: 57.2 %
Platelets: 233 10*3/uL (ref 140–400)
RBC: 4.79 10*6/uL (ref 4.20–5.80)
RDW: 13.7 % (ref 11.0–15.0)
Total Lymphocyte: 31.8 %
WBC: 4.8 10*3/uL (ref 3.8–10.8)

## 2018-08-06 LAB — LIPID PANEL
Cholesterol: 189 mg/dL (ref <200)
HDL: 33 mg/dL — ABNORMAL LOW (ref >=40)
LDL Cholesterol (Calc): 121 mg/dL (calc) — ABNORMAL HIGH
Non-HDL Cholesterol (Calc): 156 mg/dL (calc) — ABNORMAL HIGH (ref <130)
Total CHOL/HDL Ratio: 5.7 (calc) — ABNORMAL HIGH (ref <5.0)
Triglycerides: 231 mg/dL — ABNORMAL HIGH (ref <150)

## 2018-08-06 LAB — URINALYSIS, ROUTINE W REFLEX MICROSCOPIC
Bilirubin Urine: NEGATIVE
Glucose, UA: NEGATIVE
Hgb urine dipstick: NEGATIVE
Ketones, ur: NEGATIVE
Leukocytes,Ua: NEGATIVE
Nitrite: NEGATIVE
Protein, ur: NEGATIVE
Specific Gravity, Urine: 1.019 (ref 1.001–1.03)
pH: 6.5 (ref 5.0–8.0)

## 2018-08-06 LAB — MAGNESIUM: Magnesium: 1.9 mg/dL (ref 1.5–2.5)

## 2018-08-06 LAB — VITAMIN D 25 HYDROXY (VIT D DEFICIENCY, FRACTURES): Vit D, 25-Hydroxy: 13 ng/mL — ABNORMAL LOW (ref 30–100)

## 2018-08-08 DIAGNOSIS — Z1211 Encounter for screening for malignant neoplasm of colon: Secondary | ICD-10-CM | POA: Diagnosis not present

## 2018-08-10 LAB — COLOGUARD: Cologuard: POSITIVE — AB

## 2018-08-11 NOTE — Patient Instructions (Signed)

## 2018-08-11 NOTE — Progress Notes (Signed)
   Subjective:    Patient ID: Bryan Morgan, male    DOB: [birth year], 78 y.o.   MRN: [id number]  HPI     Patient is a very nice 78 yo  MBM recently established with this practice 1 week ago and who is presenting today  for evaluation of a skin lesion  on the dorsum of the left hand. 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 Above diagnoses discussed with patient and importance of better diet & weight loss.   Medication Sig  . doxycycline  100 MG capsule Take 1 capsule  2 times daily.   No Known Allergies   Past Medical History:  Diagnosis Date  . History of needle biopsy of prostate with negative result   . Legal blindness    Past Surgical History:  Procedure Laterality Date  . CATARACT EXTRACTION, BILATERAL  2007   Review of Systems    10 point systems review negative except as above.    Objective:   Physical Exam  BP 134/76   Pulse 86   Temp 97.6 F (36.4 C)   Ht 6\' 1"  (1.854 m)   Wt 226 lb (102.5 kg)   SpO2 96%   BMI 29.82 kg/m   HEENT - WNL. Neck - supple.  Chest - Clear equal BS. Cor - Nl HS. RRR w/o sig MGR.  MS- FROM w/o deformities.  Gait Nl. Neuro -  Nl w/o focal abnormalities. Skin - there is a 12 mm x 12 mm raised crusty lesion with slightly umbilicate margins dorsum Lt hand proximal to the thenar web space.   Procedure (CPT [phone])         After informed consent and aseptic prep with alcohol, the area was anesthetized with 2 ml of Marcaine 0.5%. Then with a #10 scalpel, the lesion was excised full thickness in an elliptical fashion. Then the wound edges were approximated with # 3 vertical sutures with Proline 3-0 and further aligned and everted with # 4 sutures of Nylon 4-0. Antibiotic ung and 2" x 3" Tegaderm applied . Post op wound care advised.    Assessment &  Plan:   1. 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 Hyperlipidemia, [id number]  3. Hypothyroidism  4. Vitamin D deficiency  5. Neoplasm of uncertain behavior of skin  - Dermatology pathology

## 2018-08-12 ENCOUNTER — Other Ambulatory Visit: Payer: Self-pay

## 2018-08-12 ENCOUNTER — Ambulatory Visit (INDEPENDENT_AMBULATORY_CARE_PROVIDER_SITE_OTHER): Payer: Medicare HMO | Admitting: Internal Medicine

## 2018-08-12 ENCOUNTER — Encounter: Payer: Self-pay | Admitting: Internal Medicine

## 2018-08-12 VITALS — BP 134/76 | HR 86 | Temp 97.6°F | Ht 73.0 in | Wt 226.0 lb

## 2018-08-12 DIAGNOSIS — N183 Chronic kidney disease, stage 3 unspecified: Secondary | ICD-10-CM

## 2018-08-12 DIAGNOSIS — E782 Mixed hyperlipidemia: Secondary | ICD-10-CM | POA: Diagnosis not present

## 2018-08-12 DIAGNOSIS — E0822 Diabetes mellitus due to underlying condition with diabetic chronic kidney disease: Secondary | ICD-10-CM | POA: Diagnosis not present

## 2018-08-12 DIAGNOSIS — L570 Actinic keratosis: Secondary | ICD-10-CM | POA: Diagnosis not present

## 2018-08-12 DIAGNOSIS — E1122 Type 2 diabetes mellitus with diabetic chronic kidney disease: Secondary | ICD-10-CM | POA: Insufficient documentation

## 2018-08-12 DIAGNOSIS — E559 Vitamin D deficiency, unspecified: Secondary | ICD-10-CM

## 2018-08-12 DIAGNOSIS — E039 Hypothyroidism, unspecified: Secondary | ICD-10-CM

## 2018-08-12 DIAGNOSIS — D485 Neoplasm of uncertain behavior of skin: Secondary | ICD-10-CM

## 2018-08-12 DIAGNOSIS — N182 Chronic kidney disease, stage 2 (mild): Secondary | ICD-10-CM | POA: Insufficient documentation

## 2018-08-12 DIAGNOSIS — L989 Disorder of the skin and subcutaneous tissue, unspecified: Secondary | ICD-10-CM

## 2018-08-18 LAB — COLOGUARD: Cologuard: POSITIVE — AB

## 2018-08-20 ENCOUNTER — Encounter: Payer: Self-pay | Admitting: Adult Health

## 2018-08-20 ENCOUNTER — Other Ambulatory Visit: Payer: Self-pay | Admitting: Adult Health

## 2018-08-20 DIAGNOSIS — R195 Other fecal abnormalities: Secondary | ICD-10-CM

## 2018-08-20 HISTORY — DX: Other fecal abnormalities: R19.5

## 2018-08-21 NOTE — Progress Notes (Signed)
    Patient returns s/p excision of s lesion of his dorsal Left hand. 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    All sutures removed and wound reinforced with steri-strips and 2" x 3" tegaderm  Patient instructed to monitor site for reoccurance.

## 2018-08-22 ENCOUNTER — Ambulatory Visit: Payer: Medicare HMO | Admitting: Internal Medicine

## 2018-08-22 ENCOUNTER — Other Ambulatory Visit: Payer: Self-pay

## 2018-08-22 VITALS — BP 124/76 | HR 76 | Temp 97.9°F | Resp 16 | Ht 73.0 in | Wt 224.0 lb

## 2018-08-22 DIAGNOSIS — L570 Actinic keratosis: Secondary | ICD-10-CM

## 2018-08-23 ENCOUNTER — Encounter: Payer: Self-pay | Admitting: Internal Medicine

## 2018-09-06 ENCOUNTER — Encounter: Payer: Self-pay | Admitting: *Deleted

## 2018-09-09 ENCOUNTER — Telehealth: Payer: Self-pay | Admitting: *Deleted

## 2018-09-09 ENCOUNTER — Ambulatory Visit (INDEPENDENT_AMBULATORY_CARE_PROVIDER_SITE_OTHER): Payer: Medicare HMO | Admitting: Internal Medicine

## 2018-09-09 ENCOUNTER — Encounter: Payer: Self-pay | Admitting: Internal Medicine

## 2018-09-09 ENCOUNTER — Other Ambulatory Visit: Payer: Self-pay

## 2018-09-09 VITALS — Ht 74.0 in | Wt 224.0 lb

## 2018-09-09 DIAGNOSIS — R195 Other fecal abnormalities: Secondary | ICD-10-CM

## 2018-09-09 MED ORDER — SUPREP BOWEL PREP KIT 17.5-3.13-1.6 GM/177ML PO SOLN: 1.0000 | ORAL | 0 refills | Status: DC

## 2018-09-09 MED ORDER — NA SULFATE-K SULFATE-MG SULF 17.5-3.13-1.6 GM/177ML PO SOLN: 1.0000 | Freq: Once | ORAL | 0 refills | Status: AC

## 2018-09-09 NOTE — Progress Notes (Signed)
Patient ID: Bryan Morgan, male   DOB: 1940/07/10, 78 y.o.   MRN: [id number]  This service was provided via telemedicine.  Telephone encounter The patient was located at home The provider was located in provider's GI office. The patient did consent to this telephone visit and is aware of possible charges through their insurance for this visit.   The persons participating in this telemedicine service were the patient and I. Time spent on call: 12 minutes   HPI: Bryan Morgan is a 78 year old male with little past medical history who is seen in consult at the request of Liane Comber, NP to discuss a positive Cologuard.  He is seen virtually today in the setting of COVID-19.  This is his first visit with me.  He reports that he had his annual physical performed in May and Cologuard was recommended.  Cologuard was performed and positive in late May 2020.  He has never had a colonoscopy.  He denies family history of colon cancer  He reports no specific GI complaint.  He has regular bowel movements twice a day.  No change in bowel habits.  No diarrhea or constipation.  No visible blood in his stool or melena.  No abdominal pain.  No unexpected weight loss.  No upper GI or hepatobiliary complaint.    Past Medical History:  Diagnosis Date  . History of needle biopsy of prostate with negative result   . Legal blindness     Past Surgical History:  Procedure Laterality Date  . CATARACT EXTRACTION, BILATERAL  2007    Outpatient Medications Prior to Visit  Medication Sig Dispense Refill  . cholecalciferol (VITAMIN D3) 25 MCG (1000 UT) tablet Take 1,000 Units by mouth daily.     No facility-administered medications prior to visit.     No Known Allergies  Family History  Problem Relation Age of Onset  . Diabetes Sister   . COPD Brother   . Diabetes Sister   . Colon cancer Neg Hx   . Esophageal cancer Neg Hx   . Stomach cancer Neg Hx   . Pancreatic cancer Neg Hx   . Liver  disease Neg Hx     Social History   Tobacco Use  . Smoking status: Never Smoker  . Smokeless tobacco: Never Used  Substance Use Topics  . Alcohol use: No    Comment: rare, 1 drink twice a year  . Drug use: No    ROS: As per history of present illness, otherwise negative  Ht 6\' 2"  (1.88 m)   Wt 224 lb (101.6 kg)   BMI 28.76 kg/m  No physical exam, virtual visit  RELEVANT LABS AND IMAGING: CBC    Component Value Date/Time   WBC 4.8 08/05/2018 1033   RBC 4.79 08/05/2018 1033   HGB 14.2 08/05/2018 1033   HCT 42.1 08/05/2018 1033   PLT 233 08/05/2018 1033   MCV 87.9 08/05/2018 1033   MCH 29.6 08/05/2018 1033   MCHC 33.7 08/05/2018 1033   RDW 13.7 08/05/2018 1033   LYMPHSABS 1,526 08/05/2018 1033   EOSABS 53 08/05/2018 1033   BASOSABS 19 08/05/2018 1033    CMP     Component Value Date/Time   NA 141 08/05/2018 1033   K 4.4 08/05/2018 1033   CL 101 08/05/2018 1033   CO2 32 08/05/2018 1033   GLUCOSE 87 08/05/2018 1033   BUN 18 08/05/2018 1033   CREATININE 1.19 (H) 08/05/2018 1033   CALCIUM 10.2 08/05/2018 1033   PROT  7.3 08/05/2018 1033   AST 17 08/05/2018 1033   ALT 16 08/05/2018 1033   BILITOT 0.5 08/05/2018 1033   GFRNONAA 58 (L) 08/05/2018 1033   GFRAA 67 08/05/2018 1033    ASSESSMENT/PLAN: 78 year old male with little past medical history who is seen in consult at the request of Liane Comber, NP to discuss a positive Cologuard.   1.  Positive Cologuard --colonoscopy is recommended to evaluate his positive Cologuard test.  We discussed how Cologuard works and we also discussed colonoscopy including preparation, risks, benefits and alternatives.  After discussion he is agreeable and wishes to proceed.  He would like to proceed as soon as possible and so he was offered a procedure for this Thursday, 09/12/2018 at 2 PM. --Proceed to outpatient colonoscopy in the [place] on 09/12/2018 at 2 PM --Patient will work on transportation but remind him that he will need a  care partner with him to drive him home    KD:PTELMRA, Caryl Pina, [address] [place] [address],  [place] [phone]

## 2018-09-09 NOTE — Telephone Encounter (Signed)
I have spoken to patient regarding his upcoming colonoscopy appointment on 09/12/18. I have advised him of time/date/location and prep for procedure as well as his need for carepartner 18 or older that can bring him, stay around for procedure and take him home. I have mailed instructions to his home address and offered to place instructions at front desk since instructions may not make it to his home in time for actual procedure due to closeness in date. Patient indicates that he will not be able to come her to pick up instructions but has written the very detailed instructions that I have given him down. He verbalizes understanding of all instructions given.

## 2018-09-09 NOTE — Progress Notes (Signed)
Patient ID: Bryan Morgan, male   DOB: [birth year], 78 y.o.   MRN: [id number]

## 2018-09-09 NOTE — Telephone Encounter (Signed)
Error

## 2018-09-09 NOTE — Patient Instructions (Signed)
-  You have been scheduled for a colonoscopy. Please follow written instructions given to you at your visit today.  Please pick up your prep supplies at the pharmacy within the next 1-3 days. If you use inhalers (even only as needed), please bring them with you on the day of your procedure.   If you are age 79 or older, your body mass index should be between 23-30. Your Body mass index is 28.76 kg/m. If this is out of the aforementioned range listed, please consider follow up with your Primary Care Provider.  If you are age 66 or younger, your body mass index should be between 19-25. Your Body mass index is 28.76 kg/m. If this is out of the aformentioned range listed, please consider follow up with your Primary Care Provider.

## 2018-09-09 NOTE — Addendum Note (Signed)
Addended by: Larina Bras on: 09/09/2018 06:01 PM   Modules accepted: Orders

## 2018-09-09 NOTE — Addendum Note (Signed)
Addended by: Lanny Hurst A on: 09/09/2018 10:19 AM   Modules accepted: Orders

## 2018-09-11 ENCOUNTER — Telehealth: Payer: Self-pay | Admitting: Internal Medicine

## 2018-09-11 NOTE — Telephone Encounter (Signed)

## 2018-09-12 ENCOUNTER — Ambulatory Visit (AMBULATORY_SURGERY_CENTER): Payer: Medicare HMO | Admitting: Internal Medicine

## 2018-09-12 ENCOUNTER — Other Ambulatory Visit: Payer: Self-pay

## 2018-09-12 ENCOUNTER — Encounter: Payer: Self-pay | Admitting: Internal Medicine

## 2018-09-12 VITALS — BP 152/97 | HR 78 | Temp 97.8°F | Resp 13 | Ht 72.0 in | Wt 224.0 lb

## 2018-09-12 DIAGNOSIS — K5730 Diverticulosis of large intestine without perforation or abscess without bleeding: Secondary | ICD-10-CM | POA: Diagnosis not present

## 2018-09-12 DIAGNOSIS — K635 Polyp of colon: Secondary | ICD-10-CM

## 2018-09-12 DIAGNOSIS — K648 Other hemorrhoids: Secondary | ICD-10-CM

## 2018-09-12 DIAGNOSIS — D123 Benign neoplasm of transverse colon: Secondary | ICD-10-CM | POA: Diagnosis not present

## 2018-09-12 DIAGNOSIS — D125 Benign neoplasm of sigmoid colon: Secondary | ICD-10-CM | POA: Diagnosis not present

## 2018-09-12 DIAGNOSIS — R195 Other fecal abnormalities: Secondary | ICD-10-CM | POA: Diagnosis not present

## 2018-09-12 MED ORDER — SODIUM CHLORIDE 0.9 % IV SOLN: 500.0000 mL | Freq: Once | INTRAVENOUS | Status: DC

## 2018-09-12 NOTE — Progress Notes (Signed)
Called to room to assist during endoscopic procedure.  Patient ID and intended procedure confirmed with present staff. Received instructions for my participation in the procedure from the performing physician.  

## 2018-09-12 NOTE — Op Note (Signed)
[facility] Patient Name: Bryan Morgan Procedure Date: 09/12/2018 2:24 PM MRN: [id number] Endoscopist: Jerene Bears , MD Age: 78 Referring MD:  Date of Birth: 02-Apr-1940 Gender: Male Account #: [id number] Procedure:                Colonoscopy Indications:              Positive Cologuard test Medicines:                Monitored Anesthesia Care Procedure:                Pre-Anesthesia Assessment:                           - Prior to the procedure, a History and Physical                            was performed, and patient medications and                            allergies were reviewed. The patient's tolerance of                            previous anesthesia was also reviewed. The risks                            and benefits of the procedure and the sedation                            options and risks were discussed with the patient.                            All questions were answered, and informed consent                            was obtained. Prior Anticoagulants: The patient has                            taken no previous anticoagulant or antiplatelet                            agents. ASA Grade Assessment: II - A patient with                            mild systemic disease. After reviewing the risks                            and benefits, the patient was deemed in                            satisfactory condition to undergo the procedure.                           After obtaining informed consent, the colonoscope  was passed under direct vision. Throughout the                            procedure, the patient's blood pressure, pulse, and                            oxygen saturations were monitored continuously. The                            Colonoscope was introduced through the anus and                            advanced to the the cecum, identified by                            appendiceal orifice and ileocecal valve. The                            colonoscopy was performed without difficulty. The                            patient tolerated the procedure well. The quality                            of the bowel preparation was good. The ileocecal                            valve, appendiceal orifice, and rectum were                            photographed. Scope In: 2:35:02 PM Scope Out: 2:55:57 PM Scope Withdrawal Time: 0 hours 16 minutes 36 seconds  Total Procedure Duration: 0 hours 20 minutes 55 seconds  Findings:                 The digital rectal exam was normal.                           A single small angioectasia with typical                            arborization was found in the cecum.                           Three pedunculated and sessile polyps were found in                            the transverse colon. The polyps were 4 to 8 mm in                            size. These polyps were removed with a cold snare.                            Resection and retrieval were complete.  A 25 mm polyp was found in the proximal sigmoid                            colon. The polyp was pedunculated. The polyp was                            removed with a hot snare. Resection and retrieval                            were complete.                           A 5 mm polyp was found in the sigmoid colon. The                            polyp was sessile. The polyp was removed with a                            cold snare. Resection and retrieval were complete.                           Multiple small and large-mouthed diverticula were                            found in the sigmoid colon and descending colon.                           Internal hemorrhoids were found during                            retroflexion. The hemorrhoids were medium-sized. Complications:            No immediate complications. Estimated Blood Loss:     Estimated blood loss was minimal. Impression:               - A  single colonic angioectasia.                           - Three 4 to 8 mm polyps in the transverse colon,                            removed with a cold snare. Resected and retrieved.                           - One 25 mm polyp in the proximal sigmoid colon,                            removed with a hot snare. Resected and retrieved.                           - One 5 mm polyp in the sigmoid colon, removed with  a cold snare. Resected and retrieved.                           - Severe diverticulosis in the sigmoid colon and in                            the descending colon.                           - Internal hemorrhoids. Recommendation:           - Patient has a contact number available for                            emergencies. The signs and symptoms of potential                            delayed complications were discussed with the                            patient. Return to normal activities tomorrow.                            Written discharge instructions were provided to the                            patient.                           - Resume previous diet.                           - No aspirin, ibuprofen, naproxen, or other                            non-steroidal anti-inflammatory drugs for 2 weeks                            after polyp removal.                           - Continue present medications.                           - Await pathology results.                           - Repeat colonoscopy is recommended for                            surveillance. The colonoscopy date will be                            determined after pathology results from today's                            exam become available for review. Lajuan Lines Draylon Mercadel,  MD 09/12/2018 3:06:18 PM This report has been signed electronically.

## 2018-09-12 NOTE — Progress Notes (Signed)
PT taken to PACU. Monitors in place. VSS. Report given to RN. 

## 2018-09-12 NOTE — Patient Instructions (Signed)
Impression/Recommendations:  Polyp handout given to patient. Diverticulosis handout given to patient. Hemorrhoid handout given to patient.  Resume previous diet. No aspirin, ibuprofen, naproxen, or other NSAID drugs for 2 weeks.  Tylenol only until September 26, 2018.  Await pathology results.  Repeat colonoscopy recommended for surveillance. Date to be determined after pathology results reviewed.  YOU HAD AN ENDOSCOPIC PROCEDURE TODAY AT [facility]:   Refer to the procedure report that was given to you for any specific questions about what was found during the examination.  If the procedure report does not answer your questions, please call your gastroenterologist to clarify.  If you requested that your care partner not be given the details of your procedure findings, then the procedure report has been included in a sealed envelope for you to review at your convenience later.  YOU SHOULD EXPECT: Some feelings of bloating in the abdomen. Passage of more gas than usual.  Walking can help get rid of the air that was put into your GI tract during the procedure and reduce the bloating. If you had a lower endoscopy (such as a colonoscopy or flexible sigmoidoscopy) you may notice spotting of blood in your stool or on the toilet paper. If you underwent a bowel prep for your procedure, you may not have a normal bowel movement for a few days.  Please Note:  You might notice some irritation and congestion in your nose or some drainage.  This is from the oxygen used during your procedure.  There is no need for concern and it should clear up in a day or so.  SYMPTOMS TO REPORT IMMEDIATELY:   Following lower endoscopy (colonoscopy or flexible sigmoidoscopy):  Excessive amounts of blood in the stool  Significant tenderness or worsening of abdominal pains  Swelling of the abdomen that is new, acute  Fever of 100F or higher  For urgent or emergent issues, a gastroenterologist can be reached  at any hour by calling [phone].   DIET:  We do recommend a small meal at first, but then you may proceed to your regular diet.  Drink plenty of fluids but you should avoid alcoholic beverages for 24 hours.  ACTIVITY:  You should plan to take it easy for the rest of today and you should NOT DRIVE or use heavy machinery until tomorrow (because of the sedation medicines used during the test).    FOLLOW UP: Our staff will call the number listed on your records 48-72 hours following your procedure to check on you and address any questions or concerns that you may have regarding the information given to you following your procedure. If we do not reach you, we will leave a message.  We will attempt to reach you two times.  During this call, we will ask if you have developed any symptoms of COVID 19. If you develop any symptoms (ie: fever, flu-like symptoms, shortness of breath, cough etc.) before then, please call [phone].  If you test positive for Covid 19 in the 2 weeks post procedure, please call and report this information to [place].    If any biopsies were taken you will be contacted by phone or by letter within the next 1-3 weeks.  Please call us at [phone] if you have not heard about the biopsies in 3 weeks.    SIGNATURES/CONFIDENTIALITY: You and/or your care partner have signed paperwork which will be entered into your electronic medical record.  These signatures attest to the fact that that  the information above on your After Visit Summary has been reviewed and is understood.  Full responsibility of the confidentiality of this discharge information lies with you and/or your care-partner.

## 2018-09-16 ENCOUNTER — Telehealth: Payer: Self-pay

## 2018-09-16 NOTE — Telephone Encounter (Signed)
  Follow up Call-  Call back number 09/12/2018  Post procedure Call Back phone  # [phone]  Permission to leave phone message Yes  Some recent data might be hidden     Patient questions:  Do you have a fever, pain , or abdominal swelling? No. Pain Score  0 *  Have you tolerated food without any problems? Yes.    Have you been able to return to your normal activities? Yes.    Do you have any questions about your discharge instructions: Diet   No. Medications  No. Follow up visit  No.  Do you have questions or concerns about your Care? No.  Actions: * If pain score is 4 or above: No action needed, pain <4.  1. Have you developed a fever since your procedure? no  2.   Have you had an respiratory symptoms (SOB or cough) since your procedure? no  3.   Have you tested positive for COVID 19 since your procedure no  4.   Have you had any family members/close contacts diagnosed with the COVID 19 since your procedure?  no   If yes to any of these questions please route to Joylene John, RN and Alphonsa Gin, Therapist, sports.

## 2018-09-17 ENCOUNTER — Encounter: Payer: Self-pay | Admitting: Internal Medicine

## 2018-11-04 ENCOUNTER — Ambulatory Visit (INDEPENDENT_AMBULATORY_CARE_PROVIDER_SITE_OTHER): Payer: Medicare HMO | Admitting: Adult Health

## 2018-11-04 ENCOUNTER — Other Ambulatory Visit: Payer: Self-pay

## 2018-11-04 ENCOUNTER — Encounter: Payer: Self-pay | Admitting: Adult Health

## 2018-11-04 VITALS — BP 128/72 | HR 89 | Temp 97.5°F | Wt 225.0 lb

## 2018-11-04 DIAGNOSIS — Z860101 Personal history of adenomatous and serrated colon polyps: Secondary | ICD-10-CM | POA: Insufficient documentation

## 2018-11-04 DIAGNOSIS — Z79899 Other long term (current) drug therapy: Secondary | ICD-10-CM | POA: Diagnosis not present

## 2018-11-04 DIAGNOSIS — E785 Hyperlipidemia, unspecified: Secondary | ICD-10-CM

## 2018-11-04 DIAGNOSIS — E663 Overweight: Secondary | ICD-10-CM | POA: Diagnosis not present

## 2018-11-04 DIAGNOSIS — N183 Chronic kidney disease, stage 3 unspecified: Secondary | ICD-10-CM

## 2018-11-04 DIAGNOSIS — K635 Polyp of colon: Secondary | ICD-10-CM | POA: Insufficient documentation

## 2018-11-04 DIAGNOSIS — Z86010 Personal history of colonic polyps: Secondary | ICD-10-CM | POA: Insufficient documentation

## 2018-11-04 DIAGNOSIS — E119 Type 2 diabetes mellitus without complications: Secondary | ICD-10-CM | POA: Diagnosis not present

## 2018-11-04 DIAGNOSIS — E559 Vitamin D deficiency, unspecified: Secondary | ICD-10-CM | POA: Diagnosis not present

## 2018-11-04 NOTE — Progress Notes (Signed)
3 Month follow up for Chronic Conditions Management Assessment:    Hyperlipidemia, unspecified hyperlipidemia type Currently lifestyle manged Discussed LDL goal <70; recommended statin which patient declines firmly at this time; he prefers to work on lifestyle further with goal to reverse diabetes Continue low cholesterol diet and exercise.  Check lipid panel.  -     Lipid panel -     TSH  Diet controlled T2DM Currently diet controlled; he would prefer to avoid medications Weight loss and lifestyle emphasized; carb reduction reviewed; information provided on AVS Discussed disease and risks Encouraged weight loss -     Hemoglobin A1c  Vitamin D deficiency He has initiated supplement; continue monitoring for goal of 60-100 Defer vitamin D to next visit CPE  Overweight (BMI 25.0-29.9) Long discussion about weight loss, diet, and exercise Recommended diet heavy in fruits and veggies and low in animal meats, cheeses, and dairy products, appropriate calorie intake Patient will work on portions  Discussed appropriate weight for height and initial goal (<210lb) Follow up at next visit  GERD Well managed on current medications; PRN tums after problematic meals, 2-3 weekly Discussed diet, avoiding triggers and other lifestyle changes  Medication management -     CBC with Differential/Platelet -     COMPLETE METABOLIC PANEL WITH GFR -     Magnesium  Over 30 minutes of exam, counseling, chart review, and critical decision making was performed  Future Appointments  Date Time Provider [place]  02/06/2019 11:00 AM Unk Pinto, MD [facility] None     Subjective:  Bryan Morgan is a 78 y.o. male who presents for 3 month follow up.   He is new to our office. His wife comes to our office [name]) - he is preveiously not well established with PCP, has gone to urgent care as needed.   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   He has skin lesion to L hand that was excise by Dr. Melford Aase, resulted aktinic keratosis with HPV; has done well since excision and appears fully healed.   he has a diagnosis of GERD which is currently managed by PRN tums with spicy foods.  he reports symptoms is currently well controlled, and denies breakthrough reflux, burning in chest, hoarseness or cough.    BMI is Body mass index is 30.52 kg/m., he bowls once a week, he reports he walks a lot in his neighborhood. He rides the bus and walks a lot for transportation due to vision, generally very active at work.   Wt Readings from Last 3 Encounters:  11/04/18 225 lb (102.1 kg)  09/12/18 224 lb (101.6 kg)  09/09/18 224 lb (101.6 kg)   He denies hx of hypertension, doesn't check BPs at home, today their BP is BP: 128/72 He does workout, walks extensively. He denies chest pain, shortness of breath, dizziness.    He is not on cholesterol medication and denies myalgias. His cholesterol is not at goal. He states today that he would not initiate medication for cholesterol. The cholesterol last visit was:   Lab Results  Component Value Date   CHOL 189 08/05/2018   HDL 33 (L) 08/05/2018   LDLCALC 121 (H) 08/05/2018   TRIG 231 (H) 08/05/2018   CHOLHDL 5.7 (H) 08/05/2018    He has been working on diet and exercise for lifestyle controlled borderline diabetes, and denies increased appetite, nausea, paresthesia of the feet,  polydipsia, polyuria, vomiting and weight loss. Last A1C in the office was:   Lab Results  Component Value Date   HGBA1C 6.6 (H) 08/05/2018   Lab Results  Component Value Date   GFRAA 67 08/05/2018   He was noted to have vitamin D deficiency at last visit and has initiated daily supplement of 1000 IU daily;  Lab Results  Component Value Date   VD25OH 13 (L) 08/05/2018      Medication Review:            Current Outpatient  Medications (Other):  .  cholecalciferol (VITAMIN D3) 25 MCG (1000 UT) tablet, Take 1,000 Units by mouth daily.  Current Facility-Administered Medications (Other):  .  0.9 %  sodium chloride infusion  Allergies: No Known Allergies  Current Problems (verified) has Overweight (BMI 25.0-29.9); Benign prostatic hyperplasia without lower urinary tract symptoms; History of elevated PSA; Skin lesion of hand; Gastroesophageal reflux disease without esophagitis; CKD (chronic kidney disease) stage 3, GFR 30-59 ml/min ([place]); Hyperlipidemia; Diet-controlled diabetes mellitus ([place]); Vitamin D deficiency; Diabetes mellitus due to underlying condition with stage 3 chronic kidney disease, without long-term current use of insulin ([place]); and Colon polyps on their problem list.   Patient Care Team: Unk Pinto, MD as PCP - General (Internal Medicine)  Surgical: He  has a past surgical history that includes Cataract extraction, bilateral (2007). Family His family history includes COPD in his brother; Diabetes in his sister and sister. Social history  He reports that he has never smoked. He has never used smokeless tobacco. He reports that he does not drink alcohol or use drugs.    Objective:   Today's Vitals   11/04/18 1447  BP: 128/72  Pulse: 89  Temp: (!) 97.5 F (36.4 C)  SpO2: 95%  Weight: 225 lb (102.1 kg)   Body mass index is 30.52 kg/m.  General appearance: alert, no distress, WD/WN, male HEENT: normocephalic, sclerae anicteric, TMs pearly, nares patent, no discharge or erythema, pharynx normal, severely poor vision, R pupil is non-reactive/torn, horizontal nystagmus with attempts to focus Oral cavity: MMM, no lesions Neck: supple, no lymphadenopathy, no thyromegaly, no masses Heart: RRR, normal S1, S2, no murmurs Lungs: CTA bilaterally, no wheezes, rhonchi, or rales Abdomen: +bs, soft, non tender, non distended, no masses, no hepatomegaly, no splenomegaly Musculoskeletal:  nontender, no swelling, no obvious deformity Extremities: no edema, no cyanosis, no clubbing Pulses: 2+ symmetric, upper and lower extremities, normal cap refill Neurological: alert, oriented x 3, CN2-12 intact (excepting vision testing deferred), strength normal upper extremities and lower extremities, sensation normal throughout, DTRs 2+ throughout, no cerebellar signs, gait normal (slow steady) Psychiatric: normal affect, behavior normal, pleasant  Skin: left hand lesion excision site fully healed without residual erythema, rash, texture; no other rash, notable lesions; albino coloring   Izora Ribas, NP   11/04/2018

## 2018-11-04 NOTE — Progress Notes (Deleted)
FOLLOW UP  Assessment and Plan:    Cholesterol Currently above goal; *** Continue low cholesterol diet and exercise.  Check lipid panel.   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  Perform daily foot/skin check, notify office of any concerning changes.  Check A1C  BMI *** Long discussion about weight loss, diet, and exercise Recommended diet heavy in fruits and veggies and low in animal meats, cheeses, and dairy products, appropriate calorie intake Discussed ideal weight for height (below ***) and initial weight goal (***) Patient will work on *** Will follow up in 3 months  Vitamin D Def Below goal at last visit; *** continue supplementation to maintain goal of 70-100 Defer Vit D level   Continue diet and meds as discussed. Further disposition pending results of labs. Discussed med's effects and SE's.   Over 30 minutes of exam, counseling, chart review, and critical decision making was performed.   Future Appointments  Date Time Provider [place]  11/06/2018  8:45 AM Liane Comber, NP [facility] None  02/06/2019 11:00 AM Unk Pinto, MD [facility] None    ----------------------------------------------------------------------------------------------------------------------  HPI 78 y.o. male  presents for 3 month follow up on hypertension, cholesterol, diabetes, weight and vitamin D deficiency.   He is new to our office this year. His wife comes to our office Bryan Morgan) - he is preveiously not well established with PCP, has gone to urgent care as needed.   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   He had skin lesion to L hand, approx 1 cm, unresolved for 2-3 months- biopsy by Dr. Melford Aase reviewed hyperplastic actinic keratosis with HPV involvement ***   He underwent   he has a  diagnosis of GERD which is currently managed by PRN tums with spicy foods.  he reports symptoms is currently well controlled, and denies breakthrough reflux, burning in chest, hoarseness or cough.    BMI is There is no height or weight on file to calculate BMI., he {HAS HAS WNU:27253} been working on diet and exercise. Wt Readings from Last 3 Encounters:  09/12/18 224 lb (101.6 kg)  09/09/18 224 lb (101.6 kg)  09/06/18 224 lb (101.6 kg)   His blood pressure {HAS HAS NOT:18834} been controlled at home, today their BP is    He {DOES_DOES GUY:40347} workout. He denies chest pain, shortness of breath, dizziness.   He is not on cholesterol medication. His cholesterol is not at goal. The cholesterol last visit was:   Lab Results  Component Value Date   CHOL 189 08/05/2018   HDL 33 (L) 08/05/2018   LDLCALC 121 (H) 08/05/2018   TRIG 231 (H) 08/05/2018   CHOLHDL 5.7 (H) 08/05/2018    He {Has/has not:18111} been working on diet and exercise for diet controlled T2DM, and denies {Symptoms; diabetes w/o none:19199}. Last A1C in the office was:  Lab Results  Component Value Date   HGBA1C 6.6 (H) 08/05/2018   Patient is *** on Vitamin D supplement.   Lab Results  Component Value Date   VD25OH 13 (L) 08/05/2018        Current Medications:  Current Outpatient Medications on File Prior to Visit  Medication Sig  . cholecalciferol (VITAMIN D3) 25 MCG (1000 UT) tablet Take 1,000 Units by mouth daily.   Current Facility-Administered Medications on File Prior to Visit  Medication  . 0.9 %  sodium chloride  infusion     Allergies: No Known Allergies   Medical History:  Past Medical History:  Diagnosis Date  . History of needle biopsy of prostate with negative result   . Legal blindness    Family history- Reviewed and unchanged Social history- Reviewed and unchanged   Review of Systems:  Review of Systems  Constitutional: Negative for malaise/fatigue and weight loss.  HENT: Negative  for hearing loss and tinnitus.   Eyes: Negative for blurred vision and double vision.  Respiratory: Negative for cough, shortness of breath and wheezing.   Cardiovascular: Negative for chest pain, palpitations, orthopnea, claudication and leg swelling.  Gastrointestinal: Negative for abdominal pain, blood in stool, constipation, diarrhea, heartburn, melena, nausea and vomiting.  Genitourinary: Negative.   Musculoskeletal: Negative for joint pain and myalgias.  Skin: Negative for rash.  Neurological: Negative for dizziness, tingling, sensory change, weakness and headaches.  Endo/Heme/Allergies: Negative for polydipsia.  Psychiatric/Behavioral: Negative.   All other systems reviewed and are negative.     Physical Exam: There were no vitals taken for this visit. Wt Readings from Last 3 Encounters:  09/12/18 224 lb (101.6 kg)  09/09/18 224 lb (101.6 kg)  09/06/18 224 lb (101.6 kg)   General appearance: alert, no distress, WD/WN, male HEENT: normocephalic, sclerae anicteric, TMs pearly, nares patent, no discharge or erythema, pharynx normal, severely poor vision, R pupil is non-reactive/torn, horizontal nystagmus with attempts to focus Oral cavity: MMM, no lesions Neck: supple, no lymphadenopathy, no thyromegaly, no masses Heart: RRR, normal S1, S2, no murmurs Lungs: CTA bilaterally, no wheezes, rhonchi, or rales Abdomen: +bs, soft, non tender, non distended, no masses, no hepatomegaly, no splenomegaly Musculoskeletal: nontender, no swelling, no obvious deformity Extremities: no edema, no cyanosis, no clubbing Pulses: 2+ symmetric, upper and lower extremities, normal cap refill Neurological: alert, oriented x 3, CN2-12 intact (excepting vision testing deferred), strength normal upper extremities and lower extremities, sensation normal throughout, DTRs 2+ throughout, no cerebellar signs, gait normal (slow steady) Psychiatric: normal affect, behavior normal, pleasant  Skin: ~1cm lesion to  left hand, raised, ***   Bryan Ribas, NP 8:23 AM [name]

## 2018-11-04 NOTE — Patient Instructions (Addendum)
Goals    . LDL CALC < 70    . Weight (lb) < 210 lb (95.3 kg)       Limit or no sugar, flour, potatoes, rice, corn (particularly white varieties)   Preventing Type 2 Diabetes Mellitus Type 2 diabetes (type 2 diabetes mellitus) is a long-term (chronic) disease that affects blood sugar (glucose) levels. Normally, a hormone called insulin allows glucose to enter cells in the body. The cells use glucose for energy. In type 2 diabetes, one or both of these problems may be present:  The body does not make enough insulin.  The body does not respond properly to insulin that it makes (insulin resistance). Insulin resistance or lack of insulin causes excess glucose to build up in the blood instead of going into cells. As a result, high blood glucose (hyperglycemia) develops, which can cause many complications. Being overweight or obese and having an inactive (sedentary) lifestyle can increase your risk for diabetes. Type 2 diabetes can be delayed or prevented by making certain nutrition and lifestyle changes. What nutrition changes can be made?   Eat healthy meals and snacks regularly. Keep a healthy snack with you for when you get hungry between meals, such as fruit or a handful of nuts.  Eat lean meats and proteins that are low in saturated fats, such as chicken, fish, egg whites, and beans. Avoid processed meats.  Eat plenty of fruits and vegetables and plenty of grains that have not been processed (whole grains). It is recommended that you eat: ? 1?2 cups of fruit every day. ? 2?3 cups of vegetables every day. ? 6?8 oz of whole grains every day, such as oats, whole wheat, bulgur, brown rice, quinoa, and millet.  Eat low-fat dairy products, such as milk, yogurt, and cheese.  Eat foods that contain healthy fats, such as nuts, avocado, olive oil, and canola oil.  Drink water throughout the day. Avoid drinks that contain added sugar, such as soda or sweet tea.  Follow instructions from your  health care provider about specific eating or drinking restrictions.  Control how much food you eat at a time (portion size). ? Check food labels to find out the serving sizes of foods. ? Use a kitchen scale to weigh amounts of foods.  Saute or steam food instead of frying it. Cook with water or broth instead of oils or butter.  Limit your intake of: ? Salt (sodium). Have no more than 1 tsp (2,400 mg) of sodium a day. If you have heart disease or high blood pressure, have less than ? tsp (1,500 mg) of sodium a day. ? Saturated fat. This is fat that is solid at room temperature, such as butter or fat on meat. What lifestyle changes can be made? Activity   Do moderate-intensity physical activity for at least 30 minutes on at least 5 days of the week, or as much as told by your health care provider.  Ask your health care provider what activities are safe for you. A mix of physical activities may be best, such as walking, swimming, cycling, and strength training.  Try to add physical activity into your day. For example: ? Park in spots that are farther away than usual, so that you walk more. For example, park in a far corner of the parking lot when you go to the office or the grocery store. ? Take a walk during your lunch break. ? Use stairs instead of elevators or escalators. Weight Loss  Lose weight as  directed. Your health care provider can determine how much weight loss is best for you and can help you lose weight safely.  If you are overweight or obese, you may be instructed to lose at least 5?7 % of your body weight. Alcohol and Tobacco   Limit alcohol intake to no more than 1 drink a day for nonpregnant women and 2 drinks a day for men. One drink equals 12 oz of beer, 5 oz of wine, or 1 oz of hard liquor.  Do not use any tobacco products, such as cigarettes, chewing tobacco, and e-cigarettes. If you need help quitting, ask your health care provider. Work With [facility]  Provider  Have your blood glucose tested regularly, as told by your health care provider.  Discuss your risk factors and how you can reduce your risk for diabetes.  Get screening tests as told by your health care provider. You may have screening tests regularly, especially if you have certain risk factors for type 2 diabetes.  Make an appointment with a diet and nutrition specialist (registered dietitian). A registered dietitian can help you make a healthy eating plan and can help you understand portion sizes and food labels. Why are these changes important?  It is possible to prevent or delay type 2 diabetes and related health problems by making lifestyle and nutrition changes.  It can be difficult to recognize signs of type 2 diabetes. The best way to avoid possible damage to your body is to take actions to prevent the disease before you develop symptoms. What can happen if changes are not made?  Your blood glucose levels may keep increasing. Having high blood glucose for a long time is dangerous. Too much glucose in your blood can damage your blood vessels, heart, kidneys, nerves, and eyes.  You may develop prediabetes or type 2 diabetes. Type 2 diabetes can lead to many chronic health problems and complications, such as: ? Heart disease. ? Stroke. ? Blindness. ? Kidney disease. ? Depression. ? Poor circulation in the feet and legs, which could lead to surgical removal (amputation) in severe cases. Where to find support  Ask your health care provider to recommend a registered dietitian, diabetes educator, or weight loss program.  Look for local or online weight loss groups.  Join a gym, fitness club, or outdoor activity group, such as a walking club. Where to find more information To learn more about diabetes and diabetes prevention, visit:  American Diabetes Association (ADA): www.diabetes.[employer] of Diabetes and Digestive and Kidney Diseases:  FindSpin.nl To learn more about healthy eating, visit:  The U.S. Department of Agriculture Scientist, research (physical sciences)), Choose My Plate: http://wiley-williams.com/  Office of Disease Prevention and Health Promotion (ODPHP), Dietary Guidelines: SurferLive.at Summary  You can reduce your risk for type 2 diabetes by increasing your physical activity, eating healthy foods, and losing weight as directed.  Talk with your health care provider about your risk for type 2 diabetes. Ask about any blood tests or screening tests that you need to have. This information is not intended to replace advice given to you by your health care provider. Make sure you discuss any questions you have with your health care provider. Document Released: 07/05/2015 Document Revised: 07/05/2018 Document Reviewed: 05/04/2015 Elsevier Patient Education  2020 [employer].

## 2018-11-05 LAB — CBC WITH DIFFERENTIAL/PLATELET
Absolute Monocytes: 477 cells/uL (ref 200–950)
Basophils Absolute: 21 cells/uL (ref 0–200)
Basophils Relative: 0.4 %
Eosinophils Absolute: 48 cells/uL (ref 15–500)
Eosinophils Relative: 0.9 %
HCT: 40.5 % (ref 38.5–50.0)
Hemoglobin: 13.8 g/dL (ref 13.2–17.1)
Lymphs Abs: 1670 cells/uL (ref 850–3900)
MCH: 30.2 pg (ref 27.0–33.0)
MCHC: 34.1 g/dL (ref 32.0–36.0)
MCV: 88.6 fL (ref 80.0–100.0)
MPV: 11.1 fL (ref 7.5–12.5)
Monocytes Relative: 9 %
Neutro Abs: 3085 cells/uL (ref 1500–7800)
Neutrophils Relative %: 58.2 %
Platelets: 232 10*3/uL (ref 140–400)
RBC: 4.57 10*6/uL (ref 4.20–5.80)
RDW: 14 % (ref 11.0–15.0)
Total Lymphocyte: 31.5 %
WBC: 5.3 10*3/uL (ref 3.8–10.8)

## 2018-11-05 LAB — COMPLETE METABOLIC PANEL WITH GFR
AG Ratio: 1.3 (calc) (ref 1.0–2.5)
ALT: 17 U/L (ref 9–46)
AST: 19 U/L (ref 10–35)
Albumin: 4.2 g/dL (ref 3.6–5.1)
Alkaline phosphatase (APISO): 41 U/L (ref 35–144)
BUN/Creatinine Ratio: 15 (calc) (ref 6–22)
BUN: 20 mg/dL (ref 7–25)
CO2: 30 mmol/L (ref 20–32)
Calcium: 10.4 mg/dL — ABNORMAL HIGH (ref 8.6–10.3)
Chloride: 103 mmol/L (ref 98–110)
Creat: 1.33 mg/dL — ABNORMAL HIGH (ref 0.70–1.18)
GFR, Est African American: 59 mL/min/{1.73_m2} — ABNORMAL LOW (ref >=60)
GFR, Est Non African American: 51 mL/min/{1.73_m2} — ABNORMAL LOW (ref >=60)
Globulin: 3.2 g/dL (calc) (ref 1.9–3.7)
Glucose, Bld: 81 mg/dL (ref 65–99)
Potassium: 4.2 mmol/L (ref 3.5–5.3)
Sodium: 140 mmol/L (ref 135–146)
Total Bilirubin: 0.5 mg/dL (ref 0.2–1.2)
Total Protein: 7.4 g/dL (ref 6.1–8.1)

## 2018-11-05 LAB — MAGNESIUM: Magnesium: 2 mg/dL (ref 1.5–2.5)

## 2018-11-05 LAB — LIPID PANEL
Cholesterol: 192 mg/dL (ref <200)
HDL: 33 mg/dL — ABNORMAL LOW (ref >=40)
LDL Cholesterol (Calc): 126 mg/dL (calc) — ABNORMAL HIGH
Non-HDL Cholesterol (Calc): 159 mg/dL (calc) — ABNORMAL HIGH (ref <130)
Total CHOL/HDL Ratio: 5.8 (calc) — ABNORMAL HIGH (ref <5.0)
Triglycerides: 210 mg/dL — ABNORMAL HIGH (ref <150)

## 2018-11-05 LAB — HEMOGLOBIN A1C
Hgb A1c MFr Bld: 6.6 % of total Hgb — ABNORMAL HIGH (ref <5.7)
Mean Plasma Glucose: 143 (calc)
eAG (mmol/L): 7.9 (calc)

## 2018-11-05 LAB — TSH: TSH: 4.12 mIU/L (ref 0.40–4.50)

## 2018-11-06 ENCOUNTER — Ambulatory Visit: Payer: Medicare HMO | Admitting: Adult Health

## 2019-02-05 ENCOUNTER — Encounter: Payer: Self-pay | Admitting: Internal Medicine

## 2019-02-05 NOTE — Progress Notes (Signed)
Annual  Screening/Preventative Visit  & Comprehensive Evaluation & Examination     This very nice 78 y.o. 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  He has severe near-sightedness and is able toget around independently. Patient presents for a Screening /Preventative Visit & comprehensive evaluation and management of multiple medical co-morbidities.  Patient his being  followed for HTN, HLD, T2_NIDDM,  Hypothyroidism  and Vitamin D Deficiency. Patient has hx/o GERD controlled w/diet.      Patient has labile HTN being monitored  since established in this practice since May 2020.  Patient had labs in May finding CKD3 (GFR 67) attributed to either his labile HTN or T2_DM.  Patient's BP has been controlled and today's BP is at goal - 132/78. Patient denies any cardiac symptoms as chest pain, palpitations, shortness of breath, dizziness or ankle swelling.  BP Readings from Last 3 Encounters:  02/06/19 132/78  11/04/18 128/72  09/12/18 (!) 152/97      Patient's hyperlipidemia is not controlled with diet and medications. Current  lipids are not at goal:  Lab Results  Component Value Date   CHOL 173 02/06/2019   HDL 32 (L) 02/06/2019   LDLCALC 113 (H) 02/06/2019   TRIG 160 (H) 02/06/2019   CHOLHDL 5.4 (H) 02/06/2019      Patient was recently dx'd with T2_NIDDM (A1c 6.6%  / May 2020) and patient desires to control with diet. Patient denies reactive hypoglycemic symptoms, visual blurring, diabetic polys or paresthesias. Last A1c was still not at goal:  Lab Results  Component Value Date   HGBA1C 6.5 (H) 02/06/2019          In May, his labs revealed mild hypothyroidism (sl elevated TSH 4.65 ) and was back to Normal (4.11) in Aug 2020.        Finally, patient has history of Vitamin D Deficiency ("30" / May 2020) and is supplementing low dose .    Current Outpatient Medications on File Prior to Visit  Medication Sig  . cholecalciferol (VITAMIN D3)  25 MCG (1000 UT) tablet Take 1,000 Units by mouth daily.   No current facility-administered medications on file prior to visit.    No Known Allergies   Past Medical History:  Diagnosis Date  . History of needle biopsy of prostate with negative result   . Legal blindness   . Positive colorectal cancer screening using Cologuard test 08/20/2018   08/18/2018 - GI referral placed   Health Maintenance  Topic Date Due  . OPHTHALMOLOGY EXAM  03/01/1951  . INFLUENZA VACCINE  10/26/2018  . TETANUS/TDAP  08/05/2019 (Originally 03/02/1960)  . PNA vac Low Risk Adult (1 of 2 - PCV13) 08/05/2019 (Originally 03/01/2005)  . HEMOGLOBIN A1C  08/06/2019  . FOOT EXAM  02/05/2020  . URINE MICROALBUMIN  02/06/2020  . COLONOSCOPY  09/11/2021   After (+) Cologard in May , patient had a Colonoscopy  - 09/12/2018 - Dr Hilarie Fredrickson - (+) Adenomatous Polyps - Recc 3 yr f/u  - due June 2023  Past Surgical History:  Procedure Laterality Date  . CATARACT EXTRACTION, BILATERAL  2007   Family History  Problem Relation Age of Onset  . Diabetes Sister   . COPD Brother   . Diabetes Sister   . Colon cancer Neg Hx   . Esophageal cancer Neg Hx   . Stomach cancer Neg Hx   . Pancreatic cancer Neg Hx   . Liver disease Neg Hx  Social History   Socioeconomic History  . Marital status: Married    Spouse name: Rodena Piety  . Number of children: 4  Occupational History  . 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 Smoking status: Never Smoker  . Smokeless tobacco: Never Used  Substance and Sexual Activity  . Alcohol use: No    Comment: rare, 1 drink twice a year  . Drug use: No  . Sexual activity: Yes    Partners: Female    ROS Constitutional: Denies fever, chills, weight loss/gain, headaches, insomnia,  night sweats or change in appetite. Does c/o fatigue. Eyes: Denies redness, blurred vision, diplopia, discharge, itchy or watery eyes.  ENT: Denies discharge,  congestion, post nasal drip, epistaxis, sore throat, earache, hearing loss, dental pain, Tinnitus, Vertigo, Sinus pain or snoring.  Cardio: Denies chest pain, palpitations, irregular heartbeat, syncope, dyspnea, diaphoresis, orthopnea, PND, claudication or edema Respiratory: denies cough, dyspnea, DOE, pleurisy, hoarseness, laryngitis or wheezing.  Gastrointestinal: Denies dysphagia, heartburn, reflux, water brash, pain, cramps, nausea, vomiting, bloating, diarrhea, constipation, hematemesis, melena, hematochezia, jaundice or hemorrhoids Genitourinary: Denies dysuria, frequency, urgency, nocturia, hesitancy, discharge, hematuria or flank pain Musculoskeletal: Denies arthralgia, myalgia, stiffness, Jt. Swelling, pain, limp or strain/sprain. Denies Falls. Skin: Denies puritis, rash, hives, warts, acne, eczema or change in skin lesion Neuro: No weakness, tremor, incoordination, spasms, paresthesia or pain Psychiatric: Denies confusion, memory loss or sensory loss. Denies Depression. Endocrine: Denies change in weight, skin, hair change, nocturia, and paresthesia, diabetic polys, visual blurring or hyper / hypo glycemic episodes.  Heme/Lymph: No excessive bleeding, bruising or enlarged lymph nodes.  Physical Exam  BP 132/78   Pulse (!) 52   Temp (!) 97 F (36.1 C)   Resp 16   Ht 6\' 2"  (1.88 m)   Wt 225 lb 6.4 oz (102.2 kg)   BMI 28.94 kg/m   General Appearance: Well nourished and well groomed and in no apparent distress.  Eyes: PERRLA, EOMs, conjunctiva no swelling or erythema, normal fundi and vessels. Sinuses: No frontal/maxillary tenderness ENT/Mouth: EACs patent / TMs  nl. Nares clear without erythema, swelling, mucoid exudates. Oral hygiene is good. No erythema, swelling, or exudate. Tongue normal, non-obstructing. Tonsils not swollen or erythematous. Hearing normal.  Neck: Supple, thyroid not palpable. No bruits, nodes or JVD. Respiratory: Respiratory effort normal.  BS equal and  clear bilateral without rales, rhonci, wheezing or stridor. Cardio: Heart sounds are normal with regular rate and rhythm and no murmurs, rubs or gallops. Peripheral pulses are normal and equal bilaterally without edema. No aortic or femoral bruits. Chest: symmetric with normal excursions and percussion.  Abdomen: Soft, with Nl bowel sounds. Nontender, no guarding, rebound, hernias, masses, or organomegaly.  Lymphatics: Non tender without lymphadenopathy.  Musculoskeletal: Full ROM all peripheral extremities, joint stability, 5/5 strength, and normal gait. Skin: Warm and dry without rashes, lesions, cyanosis, clubbing or  ecchymosis.  Neuro: Cranial nerves intact, reflexes equal bilaterally. Normal muscle tone, no cerebellar symptoms. Sensation intact.  Pysch: Alert and oriented X 3 with normal affect, insight and judgment appropriate.   Assessment and Plan  1. Annual Preventative/Screening Exam    2. Labile hypertension  - EKG 12-Lead - [place], retroperitnl abd,  ltd - Urinalysis, Routine w reflex microscopic - Microalbumin / Creatinine Urine Ratio - CBC with Diff - COMPLETE METABOLIC PANEL WITH GFR - Magnesium - TSH  3. Hyperlipidemia associated with type 2 diabetes mellitus ([place])  - EKG 12-Lead - [place],  retroperitnl abd,  ltd - Lipid Profile - TSH  4. Type 2 diabetes mellitus with stage 3a chronic kidney disease,  without long-term current use of insulin ([facility])  - EKG 12-Lead - [place], retroperitnl abd,  ltd - HM DIABETES FOOT EXAM - LOW EXTREMITY NEUR EXAM DOCUM - Hemoglobin A1c ([facility]) - Insulin, random  5. Vitamin D deficiency  - Vitamin D (25 hydroxy)  6. Hypothyroidism  - TSH  7. History of elevated PSA  - PSA  8. BPH with obstruction/lower urinary tract symptoms  - PSA  9. Prostate cancer screening  - PSA  10. Screening for ischemic heart disease  - EKG 12-Lead  11. FH: hypertension  - EKG 12-Lead  12. Screening for AAA (aortic abdominal aneurysm)   - [place], retroperitnl abd,  ltd  13. Medication management  - Urinalysis, Routine w reflex microscopic - Microalbumin / Creatinine Urine Ratio - CBC with Diff - COMPLETE METABOLIC PANEL WITH GFR - Magnesium - Lipid Profile - TSH - Hemoglobin A1c ([facility]) - Insulin, random - Vitamin D (25 hydroxy)         Patient was counseled in prudent diet, weight control to achieve/maintain BMI less than 25, BP monitoring, regular exercise and medications as discussed.  Discussed med effects and SE's. Routine screening labs and tests as requested with regular follow-up as recommended. Over 40 minutes of exam, counseling, chart review and high complex critical decision making was performed   Kirtland Bouchard, MD

## 2019-02-06 ENCOUNTER — Encounter: Payer: Self-pay | Admitting: Internal Medicine

## 2019-02-06 ENCOUNTER — Ambulatory Visit (INDEPENDENT_AMBULATORY_CARE_PROVIDER_SITE_OTHER): Payer: Medicare HMO | Admitting: Internal Medicine

## 2019-02-06 ENCOUNTER — Other Ambulatory Visit: Payer: Self-pay

## 2019-02-06 VITALS — BP 132/78 | HR 52 | Temp 97.0°F | Resp 16 | Ht 74.0 in | Wt 225.4 lb

## 2019-02-06 DIAGNOSIS — Z136 Encounter for screening for cardiovascular disorders: Secondary | ICD-10-CM | POA: Diagnosis not present

## 2019-02-06 DIAGNOSIS — N1831 Chronic kidney disease, stage 3a: Secondary | ICD-10-CM

## 2019-02-06 DIAGNOSIS — Z0001 Encounter for general adult medical examination with abnormal findings: Secondary | ICD-10-CM

## 2019-02-06 DIAGNOSIS — E1169 Type 2 diabetes mellitus with other specified complication: Secondary | ICD-10-CM

## 2019-02-06 DIAGNOSIS — E559 Vitamin D deficiency, unspecified: Secondary | ICD-10-CM

## 2019-02-06 DIAGNOSIS — R0989 Other specified symptoms and signs involving the circulatory and respiratory systems: Secondary | ICD-10-CM | POA: Diagnosis not present

## 2019-02-06 DIAGNOSIS — E039 Hypothyroidism, unspecified: Secondary | ICD-10-CM

## 2019-02-06 DIAGNOSIS — Z87898 Personal history of other specified conditions: Secondary | ICD-10-CM

## 2019-02-06 DIAGNOSIS — E1122 Type 2 diabetes mellitus with diabetic chronic kidney disease: Secondary | ICD-10-CM

## 2019-02-06 DIAGNOSIS — Z0000 Encounter for general adult medical examination without abnormal findings: Secondary | ICD-10-CM

## 2019-02-06 DIAGNOSIS — Z79899 Other long term (current) drug therapy: Secondary | ICD-10-CM | POA: Diagnosis not present

## 2019-02-06 DIAGNOSIS — Z8249 Family history of ischemic heart disease and other diseases of the circulatory system: Secondary | ICD-10-CM

## 2019-02-06 DIAGNOSIS — E785 Hyperlipidemia, unspecified: Secondary | ICD-10-CM | POA: Diagnosis not present

## 2019-02-06 DIAGNOSIS — N138 Other obstructive and reflux uropathy: Secondary | ICD-10-CM

## 2019-02-06 DIAGNOSIS — E0822 Diabetes mellitus due to underlying condition with diabetic chronic kidney disease: Secondary | ICD-10-CM

## 2019-02-06 DIAGNOSIS — N401 Enlarged prostate with lower urinary tract symptoms: Secondary | ICD-10-CM

## 2019-02-06 DIAGNOSIS — E1121 Type 2 diabetes mellitus with diabetic nephropathy: Secondary | ICD-10-CM | POA: Diagnosis not present

## 2019-02-06 DIAGNOSIS — Z125 Encounter for screening for malignant neoplasm of prostate: Secondary | ICD-10-CM

## 2019-02-06 NOTE — Patient Instructions (Signed)

## 2019-02-07 ENCOUNTER — Other Ambulatory Visit: Payer: Self-pay | Admitting: Internal Medicine

## 2019-02-07 DIAGNOSIS — R972 Elevated prostate specific antigen [PSA]: Secondary | ICD-10-CM

## 2019-02-07 LAB — URINALYSIS, ROUTINE W REFLEX MICROSCOPIC
Bilirubin Urine: NEGATIVE
Glucose, UA: NEGATIVE
Hgb urine dipstick: NEGATIVE
Ketones, ur: NEGATIVE
Leukocytes,Ua: NEGATIVE
Nitrite: NEGATIVE
Protein, ur: NEGATIVE
Specific Gravity, Urine: 1.018 (ref 1.001–1.03)
pH: 7 (ref 5.0–8.0)

## 2019-02-07 LAB — TSH: TSH: 3.28 mIU/L (ref 0.40–4.50)

## 2019-02-07 LAB — INSULIN, RANDOM: Insulin: 19.5 u[IU]/mL

## 2019-02-07 LAB — CBC WITH DIFFERENTIAL/PLATELET
Absolute Monocytes: 318 cells/uL (ref 200–950)
Basophils Absolute: 11 cells/uL (ref 0–200)
Basophils Relative: 0.3 %
Eosinophils Absolute: 70 cells/uL (ref 15–500)
Eosinophils Relative: 1.9 %
HCT: 40.4 % (ref 38.5–50.0)
Hemoglobin: 13.4 g/dL (ref 13.2–17.1)
Lymphs Abs: 1439 cells/uL (ref 850–3900)
MCH: 29.3 pg (ref 27.0–33.0)
MCHC: 33.2 g/dL (ref 32.0–36.0)
MCV: 88.2 fL (ref 80.0–100.0)
MPV: 10.8 fL (ref 7.5–12.5)
Monocytes Relative: 8.6 %
Neutro Abs: 1861 cells/uL (ref 1500–7800)
Neutrophils Relative %: 50.3 %
Platelets: 252 10*3/uL (ref 140–400)
RBC: 4.58 10*6/uL (ref 4.20–5.80)
RDW: 13.8 % (ref 11.0–15.0)
Total Lymphocyte: 38.9 %
WBC: 3.7 10*3/uL — ABNORMAL LOW (ref 3.8–10.8)

## 2019-02-07 LAB — COMPLETE METABOLIC PANEL WITH GFR
AG Ratio: 1.4 (calc) (ref 1.0–2.5)
ALT: 16 U/L (ref 9–46)
AST: 15 U/L (ref 10–35)
Albumin: 4.2 g/dL (ref 3.6–5.1)
Alkaline phosphatase (APISO): 44 U/L (ref 35–144)
BUN/Creatinine Ratio: 12 (calc) (ref 6–22)
BUN: 16 mg/dL (ref 7–25)
CO2: 29 mmol/L (ref 20–32)
Calcium: 10 mg/dL (ref 8.6–10.3)
Chloride: 103 mmol/L (ref 98–110)
Creat: 1.29 mg/dL — ABNORMAL HIGH (ref 0.70–1.18)
GFR, Est African American: 61 mL/min/{1.73_m2} (ref >=60)
GFR, Est Non African American: 53 mL/min/{1.73_m2} — ABNORMAL LOW (ref >=60)
Globulin: 3 g/dL (calc) (ref 1.9–3.7)
Glucose, Bld: 116 mg/dL — ABNORMAL HIGH (ref 65–99)
Potassium: 4.5 mmol/L (ref 3.5–5.3)
Sodium: 140 mmol/L (ref 135–146)
Total Bilirubin: 0.4 mg/dL (ref 0.2–1.2)
Total Protein: 7.2 g/dL (ref 6.1–8.1)

## 2019-02-07 LAB — LIPID PANEL
Cholesterol: 173 mg/dL (ref <200)
HDL: 32 mg/dL — ABNORMAL LOW (ref >=40)
LDL Cholesterol (Calc): 113 mg/dL (calc) — ABNORMAL HIGH
Non-HDL Cholesterol (Calc): 141 mg/dL (calc) — ABNORMAL HIGH (ref <130)
Total CHOL/HDL Ratio: 5.4 (calc) — ABNORMAL HIGH (ref <5.0)
Triglycerides: 160 mg/dL — ABNORMAL HIGH (ref <150)

## 2019-02-07 LAB — HEMOGLOBIN A1C
Hgb A1c MFr Bld: 6.5 % of total Hgb — ABNORMAL HIGH (ref <5.7)
Mean Plasma Glucose: 140 (calc)
eAG (mmol/L): 7.7 (calc)

## 2019-02-07 LAB — MICROALBUMIN / CREATININE URINE RATIO
Creatinine, Urine: 133 mg/dL (ref 20–320)
Microalb Creat Ratio: 5 mcg/mg creat (ref <30)
Microalb, Ur: 0.7 mg/dL

## 2019-02-07 LAB — PSA: PSA: 12.2 ng/mL — ABNORMAL HIGH (ref <=4.0)

## 2019-02-07 LAB — MAGNESIUM: Magnesium: 1.9 mg/dL (ref 1.5–2.5)

## 2019-02-07 LAB — VITAMIN D 25 HYDROXY (VIT D DEFICIENCY, FRACTURES): Vit D, 25-Hydroxy: 42 ng/mL (ref 30–100)

## 2019-02-13 ENCOUNTER — Other Ambulatory Visit: Payer: Self-pay

## 2019-02-13 ENCOUNTER — Other Ambulatory Visit: Payer: Medicare HMO

## 2019-02-13 DIAGNOSIS — R972 Elevated prostate specific antigen [PSA]: Secondary | ICD-10-CM | POA: Diagnosis not present

## 2019-02-14 LAB — PSA, TOTAL AND FREE
PSA, Free: 2.2 ng/mL
PSA, Total: 10.4 ng/mL — ABNORMAL HIGH (ref <=4.0)

## 2019-05-16 DIAGNOSIS — Z5320 Procedure and treatment not carried out because of patient's decision for unspecified reasons: Secondary | ICD-10-CM | POA: Insufficient documentation

## 2019-05-16 NOTE — Progress Notes (Signed)
3 Month follow up for Chronic Conditions Management Assessment:    Hyperlipidemia, associated with T2DM ([place]) Currently lifestyle manged Discussed LDL goal <70; recommended statin which patient declines firmly at this time; he prefers to work on lifestyle further with goal to reverse diabetes Continue low cholesterol diet and exercise.  Check lipid panel.  -     Lipid panel -     TSH  Diet controlled T2DM ([facility]) Currently diet controlled; he would prefer to avoid medications Weight loss and lifestyle emphasized; carb reduction reviewed; information provided on AVS Discussed disease and risks Encouraged weight loss -     Hemoglobin A1c  CKD II associated with T2DM (H[facility]) Increase fluids, avoid NSAIDS, monitor sugars, will monitor  Vitamin D deficiency He has initiated supplement; continue monitoring for goal of 60-100 He has not increased dose since last check; advised increase to 2000 IU  Defer vitamin D   Overweight (BMI 25.0-29.9) Long discussion about weight loss, diet, and exercise Recommended diet heavy in fruits and veggies and low in animal meats, cheeses, and dairy products, appropriate calorie intake Patient will work on portions  Discussed appropriate weight for height and initial goal (<210lb) Follow up at next visit  GERD Well managed on current medications; PRN tums after problematic meals, 2-3 weekly Discussed diet, avoiding triggers and other lifestyle changes  Medication management -     CBC with Differential/Platelet -     COMPLETE METABOLIC PANEL WITH GFR -     Magnesium  Over 30 minutes of exam, counseling, chart review, and critical decision making was performed  Future Appointments  Date Time Provider [place]  08/18/2019 11:30 AM Unk Pinto, MD [facility] None  03/08/2020 11:00 AM Unk Pinto, MD [facility] None     Subjective:  Bryan Morgan is a 79 y.o. male who presents for 3 month follow up.   He is new to our  office. His wife comes to our office Bryan Morgan) - he is preveiously not well established with PCP, has gone to urgent care as needed.   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   he has a diagnosis of GERD which is currently managed by PRN tums with spicy foods.  he reports symptoms is currently well controlled, and denies breakthrough reflux, burning in chest, hoarseness or cough.    BMI is Body mass index is 29.02 kg/m., he bowls once a week, he reports he walks a lot in his neighborhood. He rides the bus and walks a lot for transportation due to vision, generally very active at work.   Wt Readings from Last 3 Encounters:  05/19/19 226 lb (102.5 kg)  02/06/19 225 lb 6.4 oz (102.2 kg)  11/04/18 225 lb (102.1 kg)   He denies hx of hypertension, doesn't check BPs at home, today their BP is BP: 116/68 He does workout, walks extensively. He denies chest pain, shortness of breath, dizziness.    He is not on cholesterol medication and denies myalgias. His cholesterol is not at goal. He states today that he would not initiate medication for cholesterol. The cholesterol last visit was:   Lab Results  Component Value Date   CHOL 173 02/06/2019   HDL 32 (L) 02/06/2019   LDLCALC 113 (H) 02/06/2019   TRIG 160 (H) 02/06/2019   CHOLHDL 5.4 (H) 02/06/2019    He has been working on diet and exercise for lifestyle controlled borderline  diabetes, and denies increased appetite, nausea, paresthesia of the feet, polydipsia, polyuria, vomiting and weight loss. Last A1C in the office was:   Lab Results  Component Value Date   HGBA1C 6.5 (H) 02/06/2019   He has CKD II associated with T2DM monitored at this office:  Lab Results  Component Value Date   GFRAA 61 02/06/2019   He has vitamin D deficiency has initiated daily supplement taking 1000 IU daily;  Lab Results  Component Value Date    VD25OH 42 02/06/2019      Medication Review:       Current Outpatient Medications (Other):  .  cholecalciferol (VITAMIN D3) 25 MCG (1000 UT) tablet, Take 1,000 Units by mouth daily.  Allergies: No Known Allergies  Current Problems (verified) has Overweight (BMI 25.0-29.9); Benign prostatic hyperplasia without lower urinary tract symptoms; History of elevated PSA; Gastroesophageal reflux disease without esophagitis; Hyperlipidemia due to type 2 diabetes mellitus ([place]); Diet-controlled diabetes mellitus ([place]); Vitamin D deficiency; CKD stage 2 due to type 2 diabetes mellitus ([place]); Colon polyps; and Medication therapy management recommendation refused by patient on their problem list.   Patient Care Team: Unk Pinto, MD as PCP - General (Internal Medicine)  Surgical: He  has a past surgical history that includes Cataract extraction, bilateral (2007). Family His family history includes COPD in his brother; Diabetes in his sister and sister. Social history  He reports that he has never smoked. He has never used smokeless tobacco. He reports that he does not drink alcohol or use drugs.    Objective:   Today's Vitals   05/19/19 0922  BP: 116/68  Pulse: 65  Temp: (!) 97.5 F (36.4 C)  SpO2: 97%  Weight: 226 lb (102.5 kg)   Body mass index is 29.02 kg/m.  General appearance: alert, no distress, WD/WN, male HEENT: normocephalic, sclerae anicteric, TMs pearly, nares patent, no discharge or erythema, pharynx normal, severely poor vision, R pupil is non-reactive/torn, horizontal nystagmus with attempts to focus Oral cavity: MMM, no lesions Neck: supple, no lymphadenopathy, no thyromegaly, no masses Heart: RRR, normal S1, S2, no murmurs Lungs: CTA bilaterally, no wheezes, rhonchi, or rales Abdomen: +bs, soft, non tender, non distended, no masses, no hepatomegaly, no splenomegaly Musculoskeletal: nontender, no swelling, no obvious deformity Extremities: no edema, no  cyanosis, no clubbing Pulses: 2+ symmetric, upper and lower extremities, normal cap refill Neurological: alert, oriented x 3, CN2-12 intact (excepting vision testing deferred), strength normal upper extremities and lower extremities, sensation normal throughout, DTRs 2+ throughout, no cerebellar signs, gait normal (slow steady) Psychiatric: normal affect, behavior normal, pleasant  Skin: left hand lesion excision site fully healed without residual erythema, rash, texture; no other rash, notable lesions; albino coloring   Izora Ribas, NP   05/19/2019

## 2019-05-19 ENCOUNTER — Other Ambulatory Visit: Payer: Self-pay

## 2019-05-19 ENCOUNTER — Ambulatory Visit (INDEPENDENT_AMBULATORY_CARE_PROVIDER_SITE_OTHER): Payer: Medicare HMO | Admitting: Adult Health

## 2019-05-19 ENCOUNTER — Encounter: Payer: Self-pay | Admitting: Adult Health

## 2019-05-19 VITALS — BP 116/68 | HR 65 | Temp 97.5°F | Wt 226.0 lb

## 2019-05-19 DIAGNOSIS — E1169 Type 2 diabetes mellitus with other specified complication: Secondary | ICD-10-CM | POA: Diagnosis not present

## 2019-05-19 DIAGNOSIS — E119 Type 2 diabetes mellitus without complications: Secondary | ICD-10-CM

## 2019-05-19 DIAGNOSIS — E559 Vitamin D deficiency, unspecified: Secondary | ICD-10-CM | POA: Diagnosis not present

## 2019-05-19 DIAGNOSIS — N182 Chronic kidney disease, stage 2 (mild): Secondary | ICD-10-CM

## 2019-05-19 DIAGNOSIS — Z5320 Procedure and treatment not carried out because of patient's decision for unspecified reasons: Secondary | ICD-10-CM | POA: Diagnosis not present

## 2019-05-19 DIAGNOSIS — E663 Overweight: Secondary | ICD-10-CM

## 2019-05-19 DIAGNOSIS — E1122 Type 2 diabetes mellitus with diabetic chronic kidney disease: Secondary | ICD-10-CM

## 2019-05-19 DIAGNOSIS — E785 Hyperlipidemia, unspecified: Secondary | ICD-10-CM | POA: Diagnosis not present

## 2019-05-19 DIAGNOSIS — Z79899 Other long term (current) drug therapy: Secondary | ICD-10-CM

## 2019-05-19 NOTE — Patient Instructions (Addendum)
Goals    . DIET - INCREASE WATER INTAKE     65-80+ fluid ounces of fluids     . LDL CALC < 70    . Weight (lb) < 210 lb (95.3 kg)       Please monitor fluid intake for a few days - calculate how much you are getting in, increase to make sure you are getting in 65+ fluid ounces  Minimum of 4 bottles of water daily  Please increase vitamin D supplement - take 2 caps daily (2000 IU)  Try to cut back on portions for starches and sugars      Bad carbs also include fruit juice, alcohol, and sweet tea. These are empty calories that do not signal to your brain that you are full.   Please remember the good carbs are still carbs which convert into sugar. So please measure them out no more than 1/2-1 cup of rice, oatmeal, pasta, and beans  Veggies are however free foods! Pile them on.   Not all fruit is created equal. Please see the list below, the fruit at the bottom is higher in sugars than the fruit at the top. Please avoid all dried fruits.

## 2019-05-20 LAB — COMPLETE METABOLIC PANEL WITH GFR
AG Ratio: 1.4 (calc) (ref 1.0–2.5)
ALT: 15 U/L (ref 9–46)
AST: 16 U/L (ref 10–35)
Albumin: 4.2 g/dL (ref 3.6–5.1)
Alkaline phosphatase (APISO): 47 U/L (ref 35–144)
BUN/Creatinine Ratio: 12 (calc) (ref 6–22)
BUN: 16 mg/dL (ref 7–25)
CO2: 31 mmol/L (ref 20–32)
Calcium: 10.2 mg/dL (ref 8.6–10.3)
Chloride: 101 mmol/L (ref 98–110)
Creat: 1.35 mg/dL — ABNORMAL HIGH (ref 0.70–1.18)
GFR, Est African American: 57 mL/min/{1.73_m2} — ABNORMAL LOW (ref >=60)
GFR, Est Non African American: 50 mL/min/{1.73_m2} — ABNORMAL LOW (ref >=60)
Globulin: 3 g/dL (calc) (ref 1.9–3.7)
Glucose, Bld: 108 mg/dL — ABNORMAL HIGH (ref 65–99)
Potassium: 4.2 mmol/L (ref 3.5–5.3)
Sodium: 139 mmol/L (ref 135–146)
Total Bilirubin: 0.5 mg/dL (ref 0.2–1.2)
Total Protein: 7.2 g/dL (ref 6.1–8.1)

## 2019-05-20 LAB — CBC WITH DIFFERENTIAL/PLATELET
Absolute Monocytes: 319 cells/uL (ref 200–950)
Basophils Absolute: 21 cells/uL (ref 0–200)
Basophils Relative: 0.5 %
Eosinophils Absolute: 42 cells/uL (ref 15–500)
Eosinophils Relative: 1 %
HCT: 42.6 % (ref 38.5–50.0)
Hemoglobin: 14 g/dL (ref 13.2–17.1)
Lymphs Abs: 1399 cells/uL (ref 850–3900)
MCH: 29.8 pg (ref 27.0–33.0)
MCHC: 32.9 g/dL (ref 32.0–36.0)
MCV: 90.6 fL (ref 80.0–100.0)
MPV: 11.2 fL (ref 7.5–12.5)
Monocytes Relative: 7.6 %
Neutro Abs: 2419 cells/uL (ref 1500–7800)
Neutrophils Relative %: 57.6 %
Platelets: 234 10*3/uL (ref 140–400)
RBC: 4.7 10*6/uL (ref 4.20–5.80)
RDW: 13.5 % (ref 11.0–15.0)
Total Lymphocyte: 33.3 %
WBC: 4.2 10*3/uL (ref 3.8–10.8)

## 2019-05-20 LAB — LIPID PANEL
Cholesterol: 186 mg/dL (ref <200)
HDL: 33 mg/dL — ABNORMAL LOW (ref >=40)
LDL Cholesterol (Calc): 123 mg/dL (calc) — ABNORMAL HIGH
Non-HDL Cholesterol (Calc): 153 mg/dL (calc) — ABNORMAL HIGH (ref <130)
Total CHOL/HDL Ratio: 5.6 (calc) — ABNORMAL HIGH (ref <5.0)
Triglycerides: 189 mg/dL — ABNORMAL HIGH (ref <150)

## 2019-05-20 LAB — HEMOGLOBIN A1C
Hgb A1c MFr Bld: 6.5 % of total Hgb — ABNORMAL HIGH (ref <5.7)
Mean Plasma Glucose: 140 (calc)
eAG (mmol/L): 7.7 (calc)

## 2019-05-20 LAB — MAGNESIUM: Magnesium: 2 mg/dL (ref 1.5–2.5)

## 2019-05-20 LAB — TSH: TSH: 3.38 mIU/L (ref 0.40–4.50)

## 2019-08-18 ENCOUNTER — Ambulatory Visit: Payer: Medicare HMO | Admitting: Internal Medicine

## 2019-09-04 ENCOUNTER — Ambulatory Visit: Payer: Medicare HMO | Admitting: Adult Health

## 2019-09-18 NOTE — Progress Notes (Signed)
3 MONTH OV AND MEDICARE ANNUAL WELLNESS VISIT  Assessment:   Shaheen was seen today for diabetes management and medicare wellness.  Diagnoses and all orders for this visit:  Encounter for Medicare annual wellness exam Due annually  Declines all vaccines and medications Working on lifestyle  Requested urology reports Requested covid 19 vaccine record Hep C screening completed today   Hyperlipidemia, associated with T2DM ([place]) Currently lifestyle manged Discussed LDL goal <70; recommended statin which patient declines firmly at this time; he prefers to work on lifestyle further with goal to reverse diabetes Continue low cholesterol diet and exercise.  Check lipid panel.  -     Lipid panel -     TSH  Diet controlled T2DM ([facility]) Currently diet controlled; he would prefer to avoid medications Weight loss and lifestyle emphasized; carb reduction reviewed; information provided on AVS Discussed disease and risks Encouraged weight loss -     Hemoglobin A1c  CKD II associated with T2DM ([facility]) Increase fluids, avoid NSAIDS, monitor sugars, will monitor  Vitamin D deficiency He has initiated supplement; continue monitoring for goal of 60-100 He has not increased dose since last check;  Defer vitamin D   Overweight (BMI 25.0-29.9) Long discussion about weight loss, diet, and exercise Recommended diet heavy in fruits and veggies and low in animal meats, cheeses, and dairy products, appropriate calorie intake Patient will work on portions - try eating off of a salad plate Discussed appropriate weight for height and initial goal (<210lb) Follow up at next visit  GERD Well managed on current medications; PRN tums after problematic meals, 2-3 weekly Discussed diet, avoiding triggers and other lifestyle changes  Medication management -     CBC with Differential/Platelet -     COMPLETE METABOLIC PANEL WITH GFR -     Magnesium  Benign prostatic hyperplasia without lower urinary tract  symptoms Mild intermittent sx; monitor, declines medications  History of elevated PSA Biopsy neg; monitored by urology x 8 years and released - records requested today  Monitor PSA closely, recheck today   History of adenomatous colon polyps Repeat colonoscopy in 2023   Over 30 minutes of exam, counseling, chart review, and critical decision making was performed  Future Appointments  Date Time Provider [place]  09/19/2019  9:30 AM Liane Comber, NP [facility] None  03/08/2020 11:00 AM Unk Pinto, MD [facility] None     Plan:   During the course of the visit the patient was educated and counseled about appropriate screening and preventive services including:    Pneumococcal vaccine   Influenza vaccine  Prevnar 13  Td vaccine  Screening electrocardiogram  Colorectal cancer screening  Diabetes screening  Glaucoma screening  Nutrition counseling    Subjective:  Bryan Morgan is a 79 y.o. male who presents for 3 month follow up and for Medicare Annual Wellness Visit.   He is new to our office. His wife comes to our office [name]) - he is preveiously not well established with PCP, has gone to urgent care as needed.   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   he has a diagnosis of GERD which is currently managed by PRN tums with spicy foods.  he reports symptoms is currently well controlled, and denies breakthrough reflux, burning in chest, hoarseness or cough.    BMI is Body  mass index is 29.15 kg/m., he bowls once a week, he reports he walks a lot in his neighborhood. He rides the bus and walks a lot for transportation due to vision, generally very active at work, walks at least 60+ min daily.  Wt Readings from Last 3 Encounters:  09/19/19 227 lb (103  kg)  05/19/19 226 lb (102.5 kg)  02/06/19 225 lb 6.4 oz (102.2 kg)   His blood pressure has been controlled at home, today their BP is BP: 130/80  He does workout. He denies chest pain, shortness of breath, dizziness.   He is not on cholesterol medication (adamantly declines medications). His cholesterol is not at goal. The cholesterol last visit was:   Lab Results  Component Value Date   CHOL 186 05/19/2019   HDL 33 (L) 05/19/2019   LDLCALC 123 (H) 05/19/2019   TRIG 189 (H) 05/19/2019   CHOLHDL 5.6 (H) 05/19/2019    He has been working on diet and exercise for T2 diabetes, and denies increased appetite, nausea, paresthesia of the feet, polydipsia, polyuria and vomiting. Last A1C in the office was:  Lab Results  Component Value Date   HGBA1C 6.5 (H) 05/19/2019   He has CKD III associated with T2DM monitored at this office:  Lab Results  Component Value Date   GFRAA 57 (L) 05/19/2019   Patient is on Vitamin D supplement.   Lab Results  Component Value Date   VD25OH 42 02/06/2019     BPH - endorses frequency intermittently, reports no nocturia; He does have hx of PSA elevations and was followed by urology, underwent biopsy which was negative, was followed for 8 years without changes and has been released.  Lab Results  Component Value Date   PSA 12.2 (H) 02/06/2019      Medication Review:       Current Outpatient Medications (Other):  .  cholecalciferol (VITAMIN D3) 25 MCG (1000 UT) tablet, Take 1,000 Units by mouth daily.  Allergies: No Known Allergies  Current Problems (verified) has Overweight (BMI 25.0-29.9); Benign prostatic hyperplasia without lower urinary tract symptoms; Elevated PSA; Gastroesophageal reflux disease without esophagitis; Hyperlipidemia due to type 2 diabetes mellitus ([place]); Diet-controlled diabetes mellitus ([place]); Vitamin D deficiency; CKD (chronic kidney disease) stage 3, GFR 30-59 ml/min; History of adenomatous polyp of colon; and  Medication therapy management recommendation refused by patient on their problem list.  Screening Tests  There is no immunization history on file for this patient.  Preventative care: Last colonoscopy: 08/2018 after positive cologuard, precancerous polyps per Dr. Hilarie Fredrickson, 3 year follow up   Prior vaccinations: TD or Tdap: never, declines today  Influenza: declines  Pneumococcal: declines Prevnar13: declines Shingles/Zostavax: declines Covid 19: J&J, 2021 - need card for documentation   Names of Other Physician/Practitioners you currently use: 1. [facility] here for primary care 2. Dr. Marland Kitchen In [place] at [facility], eye doctor, last visit 2017, has glasses, overdue for follow up  3. [facility], dentist, has partials and bridges, last visit 2021, goes q86m  Patient Care Team: Unk Pinto, MD as PCP - General (Internal Medicine)  Surgical: He  has a past surgical history that includes Cataract extraction, bilateral (2007). Family His family history includes COPD in his brother; Diabetes in his sister and sister. Social history  He reports that he has never smoked. He has never used smokeless tobacco. He reports that he does not drink alcohol and does not use drugs.  MEDICARE WELLNESS OBJECTIVES: Physical  activity:   Cardiac risk factors:   Depression/mood screen:   Depression screen PHQ 2/9 02/05/2019  Decreased Interest -  Down, Depressed, Hopeless 1  PHQ - 2 Score 1    ADLs:  In your present state of health, do you have any difficulty performing the following activities: 02/05/2019  Hearing? N  Vision? Y  Comment legally blind  Walking or climbing stairs? Y  Dressing or bathing? N  Doing errands, shopping? Y  Comment requires transport  Some recent data might be hidden     Cognitive Testing  Alert? Yes  Normal Appearance?Yes  Oriented to person? Yes  Place? Yes   Time? Yes  Recall of three objects?  Yes  Can perform  simple calculations? Yes  Displays appropriate judgment?Yes  Can read the correct time from a watch face? N/a - very poor vision   EOL planning: Does Patient Have a Medical Advance Directive?: Yes Type of Advance Directive: Healthcare Power of Attorney, Living will Does patient want to make changes to medical advance directive?: No - Patient declined Copy of [place] in Chart?: No - copy requested   Objective:   Today's Vitals   09/19/19 0841  BP: 130/80  Pulse: 64  Temp: 97.9 F (36.6 C)  SpO2: 99%  Weight: 227 lb (103 kg)   Body mass index is 29.15 kg/m.  General appearance: alert, no distress, WD/WN, male HEENT: normocephalic, sclerae anicteric, TMs pearly, nares patent, no discharge or erythema, pharynx normal, severely poor vision, R pupil is non-reactive/torn, horizontal nystagmus with attempts to focus Oral cavity: MMM, no lesions Neck: supple, no lymphadenopathy, no thyromegaly, no masses Heart: RRR, normal S1, S2, no murmurs Lungs: CTA bilaterally, no wheezes, rhonchi, or rales Abdomen: +bs, soft, non tender, non distended, no masses, no hepatomegaly, no splenomegaly Musculoskeletal: nontender, no swelling, no obvious deformity Extremities: no edema, no cyanosis, no clubbing Pulses: 2+ symmetric, upper and lower extremities, normal cap refill Neurological: alert, oriented x 3, CN2-12 intact (excepting vision testing deferred), strength normal upper extremities and lower extremities, sensation normal throughout, DTRs 2+ throughout, no cerebellar signs, gait normal (slow steady)  Psychiatric: normal affect, behavior normal, pleasant  Skin: albino coloring without rashes or concerning lesions  Medicare Attestation I have personally reviewed: The patient's medical and social history Their use of alcohol, tobacco or illicit drugs Their current medications and supplements The patient's functional ability including ADLs,fall risks, home safety risks,  cognitive, and hearing and visual impairment Diet and physical activities Evidence for depression or mood disorders  The patient's weight, height, BMI, and visual acuity have been recorded in the chart.  I have made referrals, counseling, and provided education to the patient based on review of the above and I have provided the patient with a written personalized care plan for preventive services.     Izora Ribas, NP   09/19/2019

## 2019-09-19 ENCOUNTER — Other Ambulatory Visit: Payer: Self-pay

## 2019-09-19 ENCOUNTER — Ambulatory Visit (INDEPENDENT_AMBULATORY_CARE_PROVIDER_SITE_OTHER): Payer: Medicare HMO | Admitting: Adult Health

## 2019-09-19 ENCOUNTER — Encounter: Payer: Self-pay | Admitting: Adult Health

## 2019-09-19 VITALS — BP 130/80 | HR 64 | Temp 97.9°F | Wt 227.0 lb

## 2019-09-19 DIAGNOSIS — E1169 Type 2 diabetes mellitus with other specified complication: Secondary | ICD-10-CM

## 2019-09-19 DIAGNOSIS — K219 Gastro-esophageal reflux disease without esophagitis: Secondary | ICD-10-CM | POA: Diagnosis not present

## 2019-09-19 DIAGNOSIS — R6889 Other general symptoms and signs: Secondary | ICD-10-CM

## 2019-09-19 DIAGNOSIS — E559 Vitamin D deficiency, unspecified: Secondary | ICD-10-CM

## 2019-09-19 DIAGNOSIS — N1831 Chronic kidney disease, stage 3a: Secondary | ICD-10-CM | POA: Diagnosis not present

## 2019-09-19 DIAGNOSIS — E785 Hyperlipidemia, unspecified: Secondary | ICD-10-CM | POA: Diagnosis not present

## 2019-09-19 DIAGNOSIS — E663 Overweight: Secondary | ICD-10-CM | POA: Diagnosis not present

## 2019-09-19 DIAGNOSIS — Z86010 Personal history of colonic polyps: Secondary | ICD-10-CM | POA: Diagnosis not present

## 2019-09-19 DIAGNOSIS — N400 Enlarged prostate without lower urinary tract symptoms: Secondary | ICD-10-CM

## 2019-09-19 DIAGNOSIS — R972 Elevated prostate specific antigen [PSA]: Secondary | ICD-10-CM | POA: Diagnosis not present

## 2019-09-19 DIAGNOSIS — Z0001 Encounter for general adult medical examination with abnormal findings: Secondary | ICD-10-CM | POA: Diagnosis not present

## 2019-09-19 DIAGNOSIS — E119 Type 2 diabetes mellitus without complications: Secondary | ICD-10-CM

## 2019-09-19 DIAGNOSIS — Z1159 Encounter for screening for other viral diseases: Secondary | ICD-10-CM | POA: Diagnosis not present

## 2019-09-19 DIAGNOSIS — Z5320 Procedure and treatment not carried out because of patient's decision for unspecified reasons: Secondary | ICD-10-CM

## 2019-09-19 DIAGNOSIS — Z0000 Encounter for general adult medical examination without abnormal findings: Secondary | ICD-10-CM

## 2019-09-19 DIAGNOSIS — Z860101 Personal history of adenomatous and serrated colon polyps: Secondary | ICD-10-CM

## 2019-09-19 NOTE — Patient Instructions (Addendum)
Mr. Lento , Thank you for taking time to come for your Medicare Wellness Visit. I appreciate your ongoing commitment to your health goals. Please review the following plan we discussed and let me know if I can assist you in the future.   These are the goals we discussed: Goals    . DIET - INCREASE WATER INTAKE     65-80+ fluid ounces of fluids     . LDL CALC < 70    . Weight (lb) < 210 lb (95.3 kg)       This is a list of the screening recommended for you and due dates:  Health Maintenance  Topic Date Due  .  Hepatitis C: One time screening is recommended by Center for Disease Control  (CDC) for  adults born from [birth year] through 1966.   Never done  . Eye exam for diabetics  Never done  . COVID-19 Vaccine (1) Never done  . Tetanus Vaccine  Never done  . Pneumonia vaccines (1 of 2 - PCV13) Never done  . Flu Shot  10/26/2019  . Hemoglobin A1C  11/16/2019  . Complete foot exam   02/05/2020  . Urine Protein Check  02/06/2020  . Colon Cancer Screening  09/11/2021      Please work on weight loss- reduced portions - try eating off of a salad plate instead of a dinner   Please call the office with covid 19 vaccine information - need date and lot #  OVERDUE for eye exam - needs diabetic eye - if doc says don't need any further, please let us know      High-Fiber Diet Fiber, also called dietary fiber, is a type of carbohydrate that is found in fruits, vegetables, whole grains, and beans. A high-fiber diet can have many health benefits. Your health care provider may recommend a high-fiber diet to help:  Prevent constipation. Fiber can make your bowel movements more regular.  Lower your cholesterol.  Relieve the following conditions: ? Swelling of veins in the anus (hemorrhoids). ? Swelling and irritation (inflammation) of specific areas of the digestive tract (uncomplicated diverticulosis). ? A problem of the large intestine (colon) that sometimes causes pain and diarrhea  (irritable bowel syndrome, IBS).  Prevent overeating as part of a weight-loss plan.  Prevent heart disease, type 2 diabetes, and certain cancers. What is my plan? The recommended daily fiber intake in grams (g) includes:  38 g for men age 38 or younger.  30 g for men over age 52.  42 g for women age 53 or younger.  21 g for women over age 67. You can get the recommended daily intake of dietary fiber by:  Eating a variety of fruits, vegetables, grains, and beans.  Taking a fiber supplement, if it is not possible to get enough fiber through your diet. What do I need to know about a high-fiber diet?  It is better to get fiber through food sources rather than from fiber supplements. There is not a lot of research about how effective supplements are.  Always check the fiber content on the nutrition facts label of any prepackaged food. Look for foods that contain 5 g of fiber or more per serving.  Talk with a diet and nutrition specialist (dietitian) if you have questions about specific foods that are recommended or not recommended for your medical condition, especially if those foods are not listed below.  Gradually increase how much fiber you consume. If you increase your intake of dietary  fiber too quickly, you may have bloating, cramping, or gas.  Drink plenty of water. Water helps you to digest fiber. What are tips for following this plan?  Eat a wide variety of high-fiber foods.  Make sure that half of the grains that you eat each day are whole grains.  Eat breads and cereals that are made with whole-grain flour instead of refined flour or white flour.  Eat brown rice, bulgur wheat, or millet instead of white rice.  Start the day with a breakfast that is high in fiber, such as a cereal that contains 5 g of fiber or more per serving.  Use beans in place of meat in soups, salads, and pasta dishes.  Eat high-fiber snacks, such as berries, raw vegetables, nuts, and  popcorn.  Choose whole fruits and vegetables instead of processed forms like juice or sauce. What foods can I eat?  Fruits Berries. Pears. Apples. Oranges. Avocado. Prunes and raisins. Dried figs. Vegetables Sweet potatoes. Spinach. Kale. Artichokes. Cabbage. Broccoli. Cauliflower. Green peas. Carrots. Squash. Grains Whole-grain breads. Multigrain cereal. Oats and oatmeal. Brown rice. Barley. Bulgur wheat. [place]. Quinoa. Bran muffins. Popcorn. Rye wafer crackers. Meats and other proteins Navy, kidney, and pinto beans. Soybeans. Split peas. Lentils. Nuts and seeds. Dairy Fiber-fortified yogurt. Beverages Fiber-fortified soy milk. Fiber-fortified orange juice. Other foods Fiber bars. The items listed above may not be a complete list of recommended foods and beverages. Contact a dietitian for more options. What foods are not recommended? Fruits Fruit juice. Cooked, strained fruit. Vegetables Fried potatoes. Canned vegetables. Well-cooked vegetables. Grains White bread. Pasta made with refined flour. White rice. Meats and other proteins Fatty cuts of meat. Fried chicken or fried fish. Dairy Milk. Yogurt. Cream cheese. Sour cream. Fats and oils Butters. Beverages Soft drinks. Other foods Cakes and pastries. The items listed above may not be a complete list of foods and beverages to avoid. Contact a dietitian for more information. Summary  Fiber is a type of carbohydrate. It is found in fruits, vegetables, whole grains, and beans.  There are many health benefits of eating a high-fiber diet, such as preventing constipation, lowering blood cholesterol, helping with weight loss, and reducing your risk of heart disease, diabetes, and certain cancers.  Gradually increase your intake of fiber. Increasing too fast can result in cramping, bloating, and gas. Drink plenty of water while you increase your fiber.  The best sources of fiber include whole fruits and vegetables, whole  grains, nuts, seeds, and beans. This information is not intended to replace advice given to you by your health care provider. Make sure you discuss any questions you have with your health care provider. Document Revised: 01/15/2017 Document Reviewed: 01/15/2017 Elsevier Patient Education  2020 [employer].

## 2019-09-22 LAB — HEMOGLOBIN A1C
Hgb A1c MFr Bld: 6.4 % of total Hgb — ABNORMAL HIGH (ref <5.7)
Mean Plasma Glucose: 137 (calc)
eAG (mmol/L): 7.6 (calc)

## 2019-09-22 LAB — CBC WITH DIFFERENTIAL/PLATELET
Absolute Monocytes: 372 cells/uL (ref 200–950)
Basophils Absolute: 19 cells/uL (ref 0–200)
Basophils Relative: 0.5 %
Eosinophils Absolute: 61 cells/uL (ref 15–500)
Eosinophils Relative: 1.6 %
HCT: 41.3 % (ref 38.5–50.0)
Hemoglobin: 13.6 g/dL (ref 13.2–17.1)
Lymphs Abs: 1383 cells/uL (ref 850–3900)
MCH: 29.4 pg (ref 27.0–33.0)
MCHC: 32.9 g/dL (ref 32.0–36.0)
MCV: 89.4 fL (ref 80.0–100.0)
MPV: 10.5 fL (ref 7.5–12.5)
Monocytes Relative: 9.8 %
Neutro Abs: 1965 cells/uL (ref 1500–7800)
Neutrophils Relative %: 51.7 %
Platelets: 231 10*3/uL (ref 140–400)
RBC: 4.62 10*6/uL (ref 4.20–5.80)
RDW: 13.6 % (ref 11.0–15.0)
Total Lymphocyte: 36.4 %
WBC: 3.8 10*3/uL (ref 3.8–10.8)

## 2019-09-22 LAB — COMPLETE METABOLIC PANEL WITH GFR
AG Ratio: 1.5 (calc) (ref 1.0–2.5)
ALT: 15 U/L (ref 9–46)
AST: 16 U/L (ref 10–35)
Albumin: 4.3 g/dL (ref 3.6–5.1)
Alkaline phosphatase (APISO): 43 U/L (ref 35–144)
BUN/Creatinine Ratio: 11 (calc) (ref 6–22)
BUN: 14 mg/dL (ref 7–25)
CO2: 31 mmol/L (ref 20–32)
Calcium: 10.4 mg/dL — ABNORMAL HIGH (ref 8.6–10.3)
Chloride: 100 mmol/L (ref 98–110)
Creat: 1.23 mg/dL — ABNORMAL HIGH (ref 0.70–1.18)
GFR, Est African American: 64 mL/min/{1.73_m2} (ref >=60)
GFR, Est Non African American: 55 mL/min/{1.73_m2} — ABNORMAL LOW (ref >=60)
Globulin: 2.9 g/dL (calc) (ref 1.9–3.7)
Glucose, Bld: 112 mg/dL — ABNORMAL HIGH (ref 65–99)
Potassium: 4.1 mmol/L (ref 3.5–5.3)
Sodium: 139 mmol/L (ref 135–146)
Total Bilirubin: 0.6 mg/dL (ref 0.2–1.2)
Total Protein: 7.2 g/dL (ref 6.1–8.1)

## 2019-09-22 LAB — LIPID PANEL
Cholesterol: 185 mg/dL (ref <200)
HDL: 37 mg/dL — ABNORMAL LOW (ref >=40)
LDL Cholesterol (Calc): 118 mg/dL (calc) — ABNORMAL HIGH
Non-HDL Cholesterol (Calc): 148 mg/dL (calc) — ABNORMAL HIGH (ref <130)
Total CHOL/HDL Ratio: 5 (calc) — ABNORMAL HIGH (ref <5.0)
Triglycerides: 188 mg/dL — ABNORMAL HIGH (ref <150)

## 2019-09-22 LAB — HEPATITIS C ANTIBODY
Hepatitis C Ab: NONREACTIVE
SIGNAL TO CUT-OFF: 0.01 (ref <1.00)

## 2019-09-22 LAB — PSA: PSA: 9.8 ng/mL — ABNORMAL HIGH (ref <=4.0)

## 2019-09-22 LAB — TSH: TSH: 4.03 mIU/L (ref 0.40–4.50)

## 2019-12-25 NOTE — Progress Notes (Signed)
3 Month follow up  Assessment:    Hyperlipidemia, associated with T2DM ([place]) Currently lifestyle manged Discussed LDL goal <70; recommended statin which patient declines firmly at this time; he prefers to work on lifestyle further with goal to reverse diabetes Continue low cholesterol diet and exercise.  Check lipid panel.  -     Lipid panel -     TSH  Diet controlled T2DM ([facility]) Currently diet controlled; he would prefer to avoid medications Weight loss and lifestyle emphasized; carb reduction reviewed; information provided on AVS Discussed disease and risks Encouraged weight loss -     Hemoglobin A1c  CKD II associated with T2DM ([facility]) Increase fluids, avoid NSAIDS, monitor sugars, will monitor  Vitamin D deficiency He has initiated supplement; continue monitoring for goal of 60-100 He did increased dose since last check; 2000 IU  Defer vitamin D to next visit - CPE  Overweight (BMI 25.0-29.9) Long discussion about weight loss, diet, and exercise Recommended diet heavy in fruits and veggies and low in animal meats, cheeses, and dairy products, appropriate calorie intake Patient will work on portions  Discussed appropriate weight for height and initial goal (<210lb) Follow up at next visit  GERD Well managed on current medications; PRN tums after problematic meals, 2-3 weekly. Discussed diet, avoiding triggers and other lifestyle changes  Medication management -     CBC with Differential/Platelet -     COMPLETE METABOLIC PANEL WITH GFR -     Magnesium  Elevated BP without dx of hypertension Typically well controlled; atypical elevation today improving on recheck Monitor blood pressure at home; call if consistently over 130/80 Continue DASH diet.   Reminder to go to the ER if any CP, SOB, nausea, dizziness, severe HA, changes vision/speech, left arm numbness and tingling and jaw pain.   Over 30 minutes of exam, counseling, chart review, and critical decision making was  performed  Future Appointments  Date Time Provider [place]  04/09/2020 11:00 AM Unk Pinto, MD [facility] None  07/21/2020 10:30 AM Unk Pinto, MD [facility] None  10/01/2020  9:30 AM Liane Comber, NP [facility] None     Subjective:  ARBOR COHEN is a 79 y.o. male who presents for 3 month follow up on lifestyle controlled T2DM with CKD, lipids, weight, vitamin D.   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   he has a diagnosis of GERD which is currently managed by PRN tums with spicy foods ~ 2 times per week.   BMI is Body mass index is 29.02 kg/m., he bowls once a week, he reports he walks a lot in his neighborhood. He rides the bus and walks a lot for transportation due to vision, generally very active at work.  Tries to eat fruit daily, lots of veggie casseroles, admits pasta as well.  Wt Readings from Last 3 Encounters:  12/26/19 226 lb (102.5 kg)  09/19/19 227 lb (103 kg)  05/19/19 226 lb (102.5 kg)   He denies hx of hypertension, doesn't check BPs at home, today their BP is BP: (!) 142/80 He does workout, walks extensively. He denies chest pain, shortness of breath, dizziness.    He is not on cholesterol medication and denies myalgias. His cholesterol is not at goal. He states today that he would not initiate medication for cholesterol. The cholesterol last visit was:   Lab Results  Component Value Date   CHOL 185 09/19/2019  HDL 37 (L) 09/19/2019   LDLCALC 118 (H) 09/19/2019   TRIG 188 (H) 09/19/2019   CHOLHDL 5.0 (H) 09/19/2019    He has been working on diet and exercise for lifestyle controlled borderline diabetes, and denies increased appetite, nausea, paresthesia of the feet, polydipsia, polyuria, vomiting and weight loss. Last A1C in the office was:   Lab Results  Component Value Date   HGBA1C 6.4 (H) 09/19/2019   He has CKD II  associated with T2DM monitored at this office:  Lab Results  Component Value Date   GFRAA 64 09/19/2019   He has vitamin D deficiency has initiated daily supplement taking 2000 IU daily;  Lab Results  Component Value Date   VD25OH 42 02/06/2019      Medication Review:       Current Outpatient Medications (Other):  .  cholecalciferol (VITAMIN D3) 25 MCG (1000 UT) tablet, Take 1,000 Units by mouth daily.  Allergies: No Known Allergies  Current Problems (verified) has Overweight (BMI 25.0-29.9); Benign prostatic hyperplasia without lower urinary tract symptoms; Elevated PSA; Gastroesophageal reflux disease without esophagitis; Hyperlipidemia due to type 2 diabetes mellitus ([place]); Diet-controlled diabetes mellitus ([place]); Vitamin D deficiency; CKD stage 2 due to type 2 diabetes mellitus ([place]); History of adenomatous polyp of colon; and Medication therapy management recommendation refused by patient on their problem list.   Patient Care Team: Unk Pinto, MD as PCP - General (Internal Medicine)  Surgical: He  has a past surgical history that includes Cataract extraction, bilateral (2007). Family His family history includes COPD in his brother; Diabetes in his sister and sister. Social history  He reports that he has never smoked. He has never used smokeless tobacco. He reports that he does not drink alcohol and does not use drugs.  Review of Systems  Constitutional: Negative for malaise/fatigue and weight loss.  HENT: Negative for hearing loss and tinnitus.   Eyes: Negative for blurred vision and double vision.  Respiratory: Negative for cough, shortness of breath and wheezing.   Cardiovascular: Negative for chest pain, palpitations, orthopnea, claudication and leg swelling.  Gastrointestinal: Negative for abdominal pain, blood in stool, constipation, diarrhea, heartburn, melena, nausea and vomiting.  Genitourinary: Negative.   Musculoskeletal: Negative for joint pain and  myalgias.  Skin: Negative for rash.  Neurological: Negative for dizziness, tingling, sensory change, weakness and headaches.  Endo/Heme/Allergies: Negative for polydipsia.  Psychiatric/Behavioral: Negative.   All other systems reviewed and are negative.    Objective:   Today's Vitals   12/26/19 0853  BP: (!) 142/80  Pulse: 75  Temp: (!) 97.3 F (36.3 C)  SpO2: 97%  Weight: 226 lb (102.5 kg)   Body mass index is 29.02 kg/m.  General appearance: alert, no distress, WD/WN, male HEENT: normocephalic, sclerae anicteric, TMs pearly, nares patent, no discharge or erythema, pharynx normal, severely poor vision, R pupil is non-reactive/torn, horizontal nystagmus with attempts to focus Oral cavity: MMM, no lesions Neck: supple, no lymphadenopathy, no thyromegaly, no masses Heart: RRR, normal S1, S2, no murmurs Lungs: CTA bilaterally, no wheezes, rhonchi, or rales Abdomen: +bs, soft, non tender, non distended, no masses, no hepatomegaly, no splenomegaly Musculoskeletal: nontender, no swelling, no obvious deformity Extremities: no edema, no cyanosis, no clubbing Pulses: 2+ symmetric, upper and lower extremities, normal cap refill Neurological: alert, oriented x 3, CN2-12 intact (excepting vision testing deferred), strength normal upper extremities and lower extremities, sensation normal throughout, DTRs 2+ throughout, no cerebellar signs, gait normal (slow steady) Psychiatric: normal affect, behavior normal, pleasant  Skin: left hand lesion excision site fully healed without residual erythema, rash, texture; no other rash, notable lesions; albino coloring   Izora Ribas, NP   12/26/2019

## 2019-12-26 ENCOUNTER — Encounter: Payer: Self-pay | Admitting: Adult Health

## 2019-12-26 ENCOUNTER — Other Ambulatory Visit: Payer: Self-pay

## 2019-12-26 ENCOUNTER — Ambulatory Visit (INDEPENDENT_AMBULATORY_CARE_PROVIDER_SITE_OTHER): Payer: Medicare HMO | Admitting: Adult Health

## 2019-12-26 VITALS — BP 136/82 | HR 75 | Temp 97.3°F | Wt 226.0 lb

## 2019-12-26 DIAGNOSIS — I10 Essential (primary) hypertension: Secondary | ICD-10-CM | POA: Insufficient documentation

## 2019-12-26 DIAGNOSIS — E119 Type 2 diabetes mellitus without complications: Secondary | ICD-10-CM | POA: Diagnosis not present

## 2019-12-26 DIAGNOSIS — N182 Chronic kidney disease, stage 2 (mild): Secondary | ICD-10-CM

## 2019-12-26 DIAGNOSIS — Z79899 Other long term (current) drug therapy: Secondary | ICD-10-CM

## 2019-12-26 DIAGNOSIS — E663 Overweight: Secondary | ICD-10-CM | POA: Diagnosis not present

## 2019-12-26 DIAGNOSIS — E559 Vitamin D deficiency, unspecified: Secondary | ICD-10-CM | POA: Diagnosis not present

## 2019-12-26 DIAGNOSIS — E1169 Type 2 diabetes mellitus with other specified complication: Secondary | ICD-10-CM | POA: Diagnosis not present

## 2019-12-26 DIAGNOSIS — R030 Elevated blood-pressure reading, without diagnosis of hypertension: Secondary | ICD-10-CM | POA: Diagnosis not present

## 2019-12-26 DIAGNOSIS — E785 Hyperlipidemia, unspecified: Secondary | ICD-10-CM | POA: Diagnosis not present

## 2019-12-26 DIAGNOSIS — E1122 Type 2 diabetes mellitus with diabetic chronic kidney disease: Secondary | ICD-10-CM

## 2019-12-26 NOTE — Patient Instructions (Signed)
Goals    . DIET - INCREASE WATER INTAKE     65-80+ fluid ounces of fluids     . LDL CALC < 70    . Weight (lb) < 210 lb (95.3 kg)       Reduce frequency/portions of rice, pasta, potatoes - if having, combine with lean protein, healthy fat, high fiber   Try to increase high fiber, low starch veggies    High-Fiber Diet Fiber, also called dietary fiber, is a type of carbohydrate that is found in fruits, vegetables, whole grains, and beans. A high-fiber diet can have many health benefits. Your health care provider may recommend a high-fiber diet to help:  Prevent constipation. Fiber can make your bowel movements more regular.  Lower your cholesterol.  Prevent overeating as part of a weight-loss plan.  Prevent heart disease, type 2 diabetes, and certain cancers.   What is my plan? The recommended daily fiber intake in grams (g) includes:  38 g for men age 34 or younger.  30 g for men over age 79.  52 g for women age 74 or younger.  21 g for women over age 32. You can get the recommended daily intake of dietary fiber by:  Eating a variety of fruits, vegetables, grains, and beans.  Taking a fiber supplement, if it is not possible to get enough fiber through your diet. What do I need to know about a high-fiber diet?  It is better to get fiber through food sources rather than from fiber supplements. There is not a lot of research about how effective supplements are.  Always check the fiber content on the nutrition facts label of any prepackaged food. Look for foods that contain 5 g of fiber or more per serving.  Talk with a diet and nutrition specialist (dietitian) if you have questions about specific foods that are recommended or not recommended for your medical condition, especially if those foods are not listed below.  Gradually increase how much fiber you consume. If you increase your intake of dietary fiber too quickly, you may have bloating, cramping, or gas.  Drink  plenty of water. Water helps you to digest fiber. What are tips for following this plan?  Eat a wide variety of high-fiber foods.  Make sure that half of the grains that you eat each day are whole grains.  Eat breads and cereals that are made with whole-grain flour instead of refined flour or white flour.  Eat brown rice, bulgur wheat, or millet instead of white rice.  Start the day with a breakfast that is high in fiber, such as a cereal that contains 5 g of fiber or more per serving.  Use beans in place of meat in soups, salads, and pasta dishes.  Eat high-fiber snacks, such as berries, raw vegetables, nuts, and popcorn.  Choose whole fruits and vegetables instead of processed forms like juice or sauce. What foods can I eat?  Fruits Berries. Pears. Apples. Oranges. Avocado. Prunes and raisins. Dried figs. Vegetables Sweet potatoes. Spinach. Kale. Artichokes. Cabbage. Broccoli. Cauliflower. Green peas. Carrots. Squash. Grains Whole-grain breads. Multigrain cereal. Oats and oatmeal. Brown rice. Barley. Bulgur wheat. [place]. Quinoa. Bran muffins. Popcorn. Rye wafer crackers. Meats and other proteins Navy, kidney, and pinto beans. Soybeans. Split peas. Lentils. Nuts and seeds. Dairy Fiber-fortified yogurt. Beverages Fiber-fortified soy milk. Fiber-fortified orange juice. Other foods Fiber bars. The items listed above may not be a complete list of recommended foods and beverages. Contact a dietitian for more  options. What foods are not recommended? Fruits Fruit juice. Cooked, strained fruit. Vegetables Fried potatoes. Canned vegetables. Well-cooked vegetables. Grains White bread. Pasta made with refined flour. White rice. Meats and other proteins Fatty cuts of meat. Fried chicken or fried fish. Dairy Milk. Yogurt. Cream cheese. Sour cream. Fats and oils Butters. Beverages Soft drinks. Other foods Cakes and pastries. The items listed above may not be a complete list of  foods and beverages to avoid. Contact a dietitian for more information. Summary  Fiber is a type of carbohydrate. It is found in fruits, vegetables, whole grains, and beans.  There are many health benefits of eating a high-fiber diet, such as preventing constipation, lowering blood cholesterol, helping with weight loss, and reducing your risk of heart disease, diabetes, and certain cancers.  Gradually increase your intake of fiber. Increasing too fast can result in cramping, bloating, and gas. Drink plenty of water while you increase your fiber.  The best sources of fiber include whole fruits and vegetables, whole grains, nuts, seeds, and beans. This information is not intended to replace advice given to you by your health care provider. Make sure you discuss any questions you have with your health care provider. Document Revised: 01/15/2017 Document Reviewed: 01/15/2017 Elsevier Patient Education  2020 [employer].

## 2019-12-27 LAB — CBC WITH DIFFERENTIAL/PLATELET
Absolute Monocytes: 312 cells/uL (ref 200–950)
Basophils Absolute: 20 cells/uL (ref 0–200)
Basophils Relative: 0.5 %
Eosinophils Absolute: 80 cells/uL (ref 15–500)
Eosinophils Relative: 2 %
HCT: 41.8 % (ref 38.5–50.0)
Hemoglobin: 13.8 g/dL (ref 13.2–17.1)
Lymphs Abs: 1448 cells/uL (ref 850–3900)
MCH: 29.7 pg (ref 27.0–33.0)
MCHC: 33 g/dL (ref 32.0–36.0)
MCV: 90.1 fL (ref 80.0–100.0)
MPV: 10.7 fL (ref 7.5–12.5)
Monocytes Relative: 7.8 %
Neutro Abs: 2140 cells/uL (ref 1500–7800)
Neutrophils Relative %: 53.5 %
Platelets: 229 10*3/uL (ref 140–400)
RBC: 4.64 10*6/uL (ref 4.20–5.80)
RDW: 13.4 % (ref 11.0–15.0)
Total Lymphocyte: 36.2 %
WBC: 4 10*3/uL (ref 3.8–10.8)

## 2019-12-27 LAB — LIPID PANEL
Cholesterol: 182 mg/dL (ref <200)
HDL: 33 mg/dL — ABNORMAL LOW (ref >=40)
LDL Cholesterol (Calc): 120 mg/dL (calc) — ABNORMAL HIGH
Non-HDL Cholesterol (Calc): 149 mg/dL (calc) — ABNORMAL HIGH (ref <130)
Total CHOL/HDL Ratio: 5.5 (calc) — ABNORMAL HIGH (ref <5.0)
Triglycerides: 170 mg/dL — ABNORMAL HIGH (ref <150)

## 2019-12-27 LAB — COMPLETE METABOLIC PANEL WITH GFR
AG Ratio: 1.4 (calc) (ref 1.0–2.5)
ALT: 15 U/L (ref 9–46)
AST: 15 U/L (ref 10–35)
Albumin: 4.2 g/dL (ref 3.6–5.1)
Alkaline phosphatase (APISO): 46 U/L (ref 35–144)
BUN/Creatinine Ratio: 13 (calc) (ref 6–22)
BUN: 16 mg/dL (ref 7–25)
CO2: 32 mmol/L (ref 20–32)
Calcium: 10 mg/dL (ref 8.6–10.3)
Chloride: 102 mmol/L (ref 98–110)
Creat: 1.2 mg/dL — ABNORMAL HIGH (ref 0.70–1.18)
GFR, Est African American: 66 mL/min/{1.73_m2} (ref >=60)
GFR, Est Non African American: 57 mL/min/{1.73_m2} — ABNORMAL LOW (ref >=60)
Globulin: 2.9 g/dL (calc) (ref 1.9–3.7)
Glucose, Bld: 117 mg/dL — ABNORMAL HIGH (ref 65–99)
Potassium: 4.2 mmol/L (ref 3.5–5.3)
Sodium: 140 mmol/L (ref 135–146)
Total Bilirubin: 0.4 mg/dL (ref 0.2–1.2)
Total Protein: 7.1 g/dL (ref 6.1–8.1)

## 2019-12-27 LAB — MAGNESIUM: Magnesium: 1.9 mg/dL (ref 1.5–2.5)

## 2019-12-27 LAB — HEMOGLOBIN A1C
Hgb A1c MFr Bld: 6.5 % of total Hgb — ABNORMAL HIGH (ref <5.7)
Mean Plasma Glucose: 140 (calc)
eAG (mmol/L): 7.7 (calc)

## 2019-12-27 LAB — TSH: TSH: 4.18 mIU/L (ref 0.40–4.50)

## 2020-03-08 ENCOUNTER — Encounter: Payer: Medicare HMO | Admitting: Internal Medicine

## 2020-04-02 ENCOUNTER — Ambulatory Visit: Payer: Medicare HMO | Attending: Internal Medicine

## 2020-04-02 DIAGNOSIS — Z23 Encounter for immunization: Secondary | ICD-10-CM

## 2020-04-02 NOTE — Progress Notes (Signed)
   Covid-19 Vaccination Clinic  Name:  Bryan Morgan    MRN: [id number] DOB: 1940-09-19  04/02/2020  Mr. [name] was observed post Covid-19 immunization for 15 minutes without incident. He was provided with Vaccine Information Sheet and instruction to access the V-Safe system.   Mr. [name] was instructed to call 911 with any severe reactions post vaccine: Marland Kitchen Difficulty breathing  . Swelling of face and throat  . A fast heartbeat  . A bad rash all over body  . Dizziness and weakness   Immunizations Administered    Name Date Dose VIS Date Route   JANSSEN COVID-19 VACCINE 04/02/2020  1:48 PM 0.5 mL 01/14/2020 Intramuscular   Manufacturer: [employer]   Lot: [id number]   Echo: 10175-102-58

## 2020-04-09 ENCOUNTER — Encounter: Payer: Medicare HMO | Admitting: Internal Medicine

## 2020-06-03 ENCOUNTER — Encounter: Payer: Self-pay | Admitting: Internal Medicine

## 2020-06-03 NOTE — Progress Notes (Signed)
Annual  Screening/Preventative Visit  & Comprehensive Evaluation & Examination      This very nice 80 y.o. 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  Patient has been followed for HTN, HLD, diet T2_DM, Hypothyroidism  and Vitamin D Deficiency.  Patient has hx/o GERD controlled on his meds.      Patient has hx/o labile HTN predating at least since May 2020, when he established with this practice. He also has known hx/o CKD2  presumed due to labile HTN and /or his T2_DM. Patient's BP has been controlled at home.  Today's BP is at goal - 128/86. Patient denies any cardiac symptoms as chest pain, palpitations, shortness of breath, dizziness or ankle swelling.      Patient's hyperlipidemia is controlled with diet and medications. Patient denies myalgias or other medication SE's. Last lipids were not at goal:  Lab Results  Component Value Date   CHOL 182 12/26/2019   HDL 33 (L) 12/26/2019   LDLCALC 120 (H) 12/26/2019   TRIG 170 (H) 12/26/2019   CHOLHDL 5.5 (H) 12/26/2019        Patient has hx/o diet controlled T2_NIDDM (A1c 6.6%/May2020) w/CKD2 (GFR 66)  and patient denies reactive hypoglycemic symptoms, visual blurring, diabetic polys or paresthesias. Last A1c was not at goal:  Lab Results  Component Value Date   HGBA1C 6.5 (H) 12/26/2019         Finally, patient has history of Vitamin D Deficiency ("13"/May2020) and last vitamin D was still low:   Lab Results  Component Value Date   VD25OH 42 02/06/2019    Current Outpatient Medications on File Prior to Visit  Medication Sig  . VITAMIN D  1000 u Take 1,000 Units 2 (two) times daily.    No Known Allergies  Past Medical History:  Diagnosis Date  . History of needle biopsy of prostate with negative result   . Legal blindness   . Positive colorectal cancer screening using Cologuard test 08/20/2018    08/18/2018 - GI referral placed    Health Maintenance  Topic Date Due  . OPHTHALMOLOGY EXAM  Never done  . INFLUENZA VACCINE  Never done  . FOOT EXAM  02/05/2020  . URINE MICROALBUMIN  02/06/2020  . TETANUS/TDAP  09/18/2020 (Originally 03/02/1960)  . PNA vac Low Risk Adult (1 of 2 - PCV13) 09/18/2020 (Originally 03/01/2005)  . HEMOGLOBIN A1C  06/25/2020  . COLONOSCOPY 09/11/2021  . COVID-19 Vaccine  Completed  . HPV VACCINES  Aged Out    Immunization History  Administered Date(s) Administered  . Janssen (J&J) SARS-COV-2 Vaccination 08/16/2019, 04/02/2020    08/20/2018  (+) positive Cologard  09/12/2018 - Dr Hilarie Fredrickson - 3 polyps - Recc 3 year f/u due June 2023  Past Surgical History:  Procedure Laterality Date  . CATARACT EXTRACTION, BILATERAL  2007    Family History  Problem Relation Age of Onset  . Diabetes Sister   . COPD Brother   . Diabetes Sister   . Colon cancer Neg Hx   . Esophageal cancer Neg Hx   . Stomach cancer Neg Hx   . Pancreatic cancer Neg Hx   . Liver disease Neg Hx     Social History   Socioeconomic History  . Marital status: Married    Spouse name:   . Number of children: 4  Occupational History  .   Tobacco Use  . Smoking status:  Never Smoker  . Smokeless tobacco: Never Used  Vaping Use  . Vaping Use: Never used  Substance and Sexual Activity  . Alcohol use: No    Comment: rare, 1 drink twice a year  . Drug use: No  . Sexual activity: Yes    Partners: Female     ROS Constitutional: Denies fever, chills, weight loss/gain, headaches, insomnia,  night sweats or change in appetite. Does c/o fatigue. Eyes: Denies redness, blurred vision, diplopia, discharge, itchy or watery eyes.  ENT: Denies discharge, congestion, post nasal drip, epistaxis, sore throat, earache, hearing loss, dental pain, Tinnitus, Vertigo, Sinus pain or snoring.  Cardio: Denies chest pain, palpitations, irregular heartbeat, syncope, dyspnea, diaphoresis, orthopnea, PND,  claudication or edema Respiratory: denies cough, dyspnea, DOE, pleurisy, hoarseness, laryngitis or wheezing.  Gastrointestinal: Denies dysphagia, heartburn, reflux, water brash, pain, cramps, nausea, vomiting, bloating, diarrhea, constipation, hematemesis, melena, hematochezia, jaundice or hemorrhoids Genitourinary: Denies dysuria, frequency, urgency, nocturia, hesitancy, discharge, hematuria or flank pain Musculoskeletal: Denies arthralgia, myalgia, stiffness, Jt. Swelling, pain, limp or strain/sprain. Denies Falls. Skin: Denies puritis, rash, hives, warts, acne, eczema or change in skin lesion Neuro: No weakness, tremor, incoordination, spasms, paresthesia or pain Psychiatric: Denies confusion, memory loss or sensory loss. Denies Depression. Endocrine: Denies change in weight, skin, hair change, nocturia, and paresthesia, diabetic polys, visual blurring or hyper / hypo glycemic episodes.  Heme/Lymph: No excessive bleeding, bruising or enlarged lymph nodes.  Physical Exam  BP 128/86   Temp 98.1 F (36.7 C)   Ht 6\' 1"  (1.854 m)   Wt 223 lb (101.2 kg)   BMI 29.42 kg/m   General Appearance: Well nourished and well groomed and in no apparent distress.  Eyes:  Sl anisocoria w/o tonic light reflex and Rt pupil  elliptical. 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 Vision decreased to forms w/o details.  Sinuses: No frontal/maxillary tenderness ENT/Mouth: EACs patent / TMs  nl. Nares clear without erythema, swelling, mucoid exudates. Oral hygiene is good. No erythema, swelling, or exudate. Tongue normal, non-obstructing. Tonsils not swollen or erythematous. Hearing normal.  Neck: Supple, thyroid not palpable. No bruits, nodes or JVD. Respiratory: Respiratory effort normal.  BS equal and clear bilateral without rales, rhonci, wheezing or stridor. Cardio: Heart sounds are normal with regular rate and rhythm and no murmurs,  rubs or gallops. Peripheral pulses are normal and equal bilaterally without edema. No aortic or femoral bruits. Chest: symmetric with normal excursions and percussion.  Abdomen: Soft, with Nl bowel sounds. Nontender, no guarding, rebound, hernias, masses, or organomegaly.  Lymphatics: Non tender without lymphadenopathy.  Musculoskeletal: Full ROM all peripheral extremities, joint stability, 5/5 strength, and normal gait. Skin: Warm and dry without rashes, lesions, cyanosis, clubbing or  ecchymosis.  Neuro: Cranial nerves intact, reflexes equal bilaterally. Normal muscle tone, no cerebellar symptoms. Sensation intact.  Pysch: Alert and oriented X 3 with normal affect, insight and judgment appropriate.   Assessment and Plan  1. Annual Preventative/Screening Exam    2. Labile hypertension  - EKG 12-Lead - [place], RETROPERITNL ABD,  LTD - Urinalysis, Routine w reflex microscopic - Microalbumin / creatinine urine ratio - CBC with Differential/Platelet - COMPLETE METABOLIC PANEL WITH GFR - Magnesium - TSH  3. Hyperlipidemia associated with type 2 diabetes mellitus ([place])  - EKG 12-Lead - [place], RETROPERITNL ABD,  LTD - Lipid panel - TSH  4. Diabetes mellitus due to underlying condition with stage 2 chronic  kidney disease, without  long-term current use of insulin ([facility])  - EKG 12-Lead - [place], RETROPERITNL ABD,  LTD - HM DIABETES FOOT EXAM - LOW EXTREMITY NEUR EXAM DOCUM - Hemoglobin A1c - Insulin, random  5. Vitamin D deficiency  - VITAMIN D 25 Hydroxy  6. Hypothyroidism  - TSH  7. BPH with obstruction/lower urinary tract symptoms  - PSA  8. Screening for colorectal cancer  - POC Hemoccult Bld/Stl  9. Prostate cancer screening  - PSA  10. Screening for ischemic heart disease  - EKG 12-Lead  11. FH: hypertension  - EKG 12-Lead - [place], RETROPERITNL ABD,  LTD  12. Screening for AAA (aortic abdominal aneurysm)  - [place], RETROPERITNL ABD,  LTD  13. Medication  management  - Urinalysis, Routine w reflex microscopic - Microalbumin / creatinine urine ratio - CBC with Differential/Platelet - COMPLETE METABOLIC PANEL WITH GFR - Magnesium - Lipid panel - TSH - Hemoglobin A1c - Insulin, random - VITAMIN D 25 Hydroxy          Patient was counseled in prudent diet, weight control to achieve/maintain BMI less than 25, BP monitoring, regular exercise and medications as discussed.  Discussed med effects and SE's. Routine screening labs and tests as requested with regular follow-up as recommended. Over 40 minutes of exam, counseling, chart review and high complex critical decision making was performed   Kirtland Bouchard, MD

## 2020-06-03 NOTE — Patient Instructions (Signed)

## 2020-06-04 ENCOUNTER — Other Ambulatory Visit: Payer: Self-pay

## 2020-06-04 ENCOUNTER — Encounter: Payer: Self-pay | Admitting: Internal Medicine

## 2020-06-04 ENCOUNTER — Ambulatory Visit (INDEPENDENT_AMBULATORY_CARE_PROVIDER_SITE_OTHER): Payer: Medicare HMO | Admitting: Internal Medicine

## 2020-06-04 VITALS — BP 128/86 | Temp 98.1°F | Ht 73.0 in | Wt 223.0 lb

## 2020-06-04 DIAGNOSIS — E039 Hypothyroidism, unspecified: Secondary | ICD-10-CM | POA: Diagnosis not present

## 2020-06-04 DIAGNOSIS — E785 Hyperlipidemia, unspecified: Secondary | ICD-10-CM

## 2020-06-04 DIAGNOSIS — Z8249 Family history of ischemic heart disease and other diseases of the circulatory system: Secondary | ICD-10-CM

## 2020-06-04 DIAGNOSIS — N401 Enlarged prostate with lower urinary tract symptoms: Secondary | ICD-10-CM | POA: Diagnosis not present

## 2020-06-04 DIAGNOSIS — Z79899 Other long term (current) drug therapy: Secondary | ICD-10-CM | POA: Diagnosis not present

## 2020-06-04 DIAGNOSIS — E1169 Type 2 diabetes mellitus with other specified complication: Secondary | ICD-10-CM

## 2020-06-04 DIAGNOSIS — N182 Chronic kidney disease, stage 2 (mild): Secondary | ICD-10-CM

## 2020-06-04 DIAGNOSIS — E559 Vitamin D deficiency, unspecified: Secondary | ICD-10-CM | POA: Diagnosis not present

## 2020-06-04 DIAGNOSIS — Z125 Encounter for screening for malignant neoplasm of prostate: Secondary | ICD-10-CM

## 2020-06-04 DIAGNOSIS — R0989 Other specified symptoms and signs involving the circulatory and respiratory systems: Secondary | ICD-10-CM | POA: Diagnosis not present

## 2020-06-04 DIAGNOSIS — Z0001 Encounter for general adult medical examination with abnormal findings: Secondary | ICD-10-CM

## 2020-06-04 DIAGNOSIS — Z1211 Encounter for screening for malignant neoplasm of colon: Secondary | ICD-10-CM

## 2020-06-04 DIAGNOSIS — Z0000 Encounter for general adult medical examination without abnormal findings: Secondary | ICD-10-CM | POA: Diagnosis not present

## 2020-06-04 DIAGNOSIS — N138 Other obstructive and reflux uropathy: Secondary | ICD-10-CM | POA: Diagnosis not present

## 2020-06-04 DIAGNOSIS — R7303 Prediabetes: Secondary | ICD-10-CM

## 2020-06-04 DIAGNOSIS — Z136 Encounter for screening for cardiovascular disorders: Secondary | ICD-10-CM

## 2020-06-04 DIAGNOSIS — E0822 Diabetes mellitus due to underlying condition with diabetic chronic kidney disease: Secondary | ICD-10-CM

## 2020-06-04 HISTORY — DX: Prediabetes: R73.03

## 2020-06-04 NOTE — Progress Notes (Signed)
AAA scan results  Per provider WNL  Diameter is less than 3 cm

## 2020-06-05 NOTE — Progress Notes (Signed)
============================================================ ============================================================  -    PSA still elevated  ~ about the same as the last 10 years  ============================================================ ============================================================  -  Total Chol = 181 -  Excellent   - Very low risk for Heart Attack  / Stroke ========================================================  - But the bad LDL Chol = 117 - elevated  - goal is less than 100  -  Recommend a stricter low cholesterol diet   - Cholesterol only comes from animal sources  - ie. meat, dairy, egg yolks  - Eat all the vegetables you want.  - Avoid meat, especially red meat - Beef AND Pork .  - Avoid cheese & dairy - milk & ice cream.     - Cheese is the most concentrated form of trans-fats which  is the worst thing to clog up our arteries.   - Veggie cheese is OK which can be found in the fresh  produce section at [employer] or [employer] or [employer] ============================================================ ============================================================  -  A1c = 6.4% still elevated in the early Diabetic range                                                  & very close til needing to start Diabetic meds.  -  So . . . .  It is very important that you work harder with diet by  avoiding all foods that are white except chicken,   fish & calliflower.  - Avoid white rice  (brown & wild rice is OK),   - Avoid white potatoes  (sweet potatoes in moderation is OK),   White bread or wheat bread or anything made out of   white flour like bagels, donuts, rolls, buns, biscuits, cakes,  - pastries, cookies, pizza crust, and pasta (made from  white flour & egg whites)   - vegetarian pasta or spinach or wheat pasta is OK.  - Multigrain breads like Arnold's, Pepper[employer] or   multigrain sandwich thins or high fiber breads like    Eureka bread or "Dave's Killer" breads that are  4 to 5 grams fiber per slice !  are best.   ============================================================ ============================================================  -  Vitamin D = 35 - is extremely low   (Ideal or goal is betw 70-100)  - So Please take Vitamin D 5,000 unit capsules                                                              x 2 capsules = 10,000 units /day   ============================================================ ============================================================  -  All Else - CBC - Kidneys - U/A - Electrolytes - Liver - Magnesium & Thyroid    - all  Normal / OK ============================================================ ============================================================

## 2020-06-07 LAB — COMPLETE METABOLIC PANEL WITH GFR
AG Ratio: 1.4 (calc) (ref 1.0–2.5)
ALT: 11 U/L (ref 9–46)
AST: 13 U/L (ref 10–35)
Albumin: 4.2 g/dL (ref 3.6–5.1)
Alkaline phosphatase (APISO): 46 U/L (ref 35–144)
BUN/Creatinine Ratio: 15 (calc) (ref 6–22)
BUN: 17 mg/dL (ref 7–25)
CO2: 28 mmol/L (ref 20–32)
Calcium: 10 mg/dL (ref 8.6–10.3)
Chloride: 104 mmol/L (ref 98–110)
Creat: 1.13 mg/dL — ABNORMAL HIGH (ref 0.70–1.11)
GFR, Est African American: 71 mL/min/{1.73_m2} (ref >=60)
GFR, Est Non African American: 61 mL/min/{1.73_m2} (ref >=60)
Globulin: 3.1 g/dL (calc) (ref 1.9–3.7)
Glucose, Bld: 92 mg/dL (ref 65–99)
Potassium: 4.3 mmol/L (ref 3.5–5.3)
Sodium: 139 mmol/L (ref 135–146)
Total Bilirubin: 0.5 mg/dL (ref 0.2–1.2)
Total Protein: 7.3 g/dL (ref 6.1–8.1)

## 2020-06-07 LAB — CBC WITH DIFFERENTIAL/PLATELET
Absolute Monocytes: 402 cells/uL (ref 200–950)
Basophils Absolute: 21 cells/uL (ref 0–200)
Basophils Relative: 0.5 %
Eosinophils Absolute: 70 cells/uL (ref 15–500)
Eosinophils Relative: 1.7 %
HCT: 41.2 % (ref 38.5–50.0)
Hemoglobin: 13.9 g/dL (ref 13.2–17.1)
Lymphs Abs: 1226 cells/uL (ref 850–3900)
MCH: 29.9 pg (ref 27.0–33.0)
MCHC: 33.7 g/dL (ref 32.0–36.0)
MCV: 88.6 fL (ref 80.0–100.0)
MPV: 10.6 fL (ref 7.5–12.5)
Monocytes Relative: 9.8 %
Neutro Abs: 2382 cells/uL (ref 1500–7800)
Neutrophils Relative %: 58.1 %
Platelets: 245 10*3/uL (ref 140–400)
RBC: 4.65 10*6/uL (ref 4.20–5.80)
RDW: 13.9 % (ref 11.0–15.0)
Total Lymphocyte: 29.9 %
WBC: 4.1 10*3/uL (ref 3.8–10.8)

## 2020-06-07 LAB — MICROALBUMIN / CREATININE URINE RATIO
Creatinine, Urine: 103 mg/dL (ref 20–320)
Microalb Creat Ratio: 29 mcg/mg creat (ref <30)
Microalb, Ur: 3 mg/dL

## 2020-06-07 LAB — URINALYSIS, ROUTINE W REFLEX MICROSCOPIC
Bilirubin Urine: NEGATIVE
Glucose, UA: NEGATIVE
Hgb urine dipstick: NEGATIVE
Ketones, ur: NEGATIVE
Leukocytes,Ua: NEGATIVE
Nitrite: NEGATIVE
Protein, ur: NEGATIVE
Specific Gravity, Urine: 1.014 (ref 1.001–1.03)
pH: 6.5 (ref 5.0–8.0)

## 2020-06-07 LAB — HEMOGLOBIN A1C
Hgb A1c MFr Bld: 6.4 % of total Hgb — ABNORMAL HIGH (ref <5.7)
Mean Plasma Glucose: 137 mg/dL
eAG (mmol/L): 7.6 mmol/L

## 2020-06-07 LAB — TSH: TSH: 2.99 mIU/L (ref 0.40–4.50)

## 2020-06-07 LAB — LIPID PANEL
Cholesterol: 181 mg/dL (ref <200)
HDL: 37 mg/dL — ABNORMAL LOW (ref >=40)
LDL Cholesterol (Calc): 117 mg/dL (calc) — ABNORMAL HIGH
Non-HDL Cholesterol (Calc): 144 mg/dL (calc) — ABNORMAL HIGH (ref <130)
Total CHOL/HDL Ratio: 4.9 (calc) (ref <5.0)
Triglycerides: 158 mg/dL — ABNORMAL HIGH (ref <150)

## 2020-06-07 LAB — MAGNESIUM: Magnesium: 1.9 mg/dL (ref 1.5–2.5)

## 2020-06-07 LAB — PSA: PSA: 11.06 ng/mL — ABNORMAL HIGH (ref <=4.0)

## 2020-06-07 LAB — INSULIN, RANDOM: Insulin: 22.9 u[IU]/mL — ABNORMAL HIGH

## 2020-06-07 LAB — VITAMIN D 25 HYDROXY (VIT D DEFICIENCY, FRACTURES): Vit D, 25-Hydroxy: 35 ng/mL (ref 30–100)

## 2020-07-21 ENCOUNTER — Ambulatory Visit: Payer: Medicare HMO | Admitting: Internal Medicine

## 2020-09-30 NOTE — Progress Notes (Signed)
3 MONTH OV AND MEDICARE ANNUAL WELLNESS VISIT  Assessment:   Bryan Morgan was seen today for diabetes management and medicare wellness.  Diagnoses and all orders for this visit:  Encounter for Medicare annual wellness exam Due annually  Declines all vaccines and medications Working on lifestyle, goals set  Hyperlipidemia, associated with T2DM ([place]) Currently lifestyle manged Discussed LDL goal <70; recommended statin which patient declines firmly at this time; he prefers to work on lifestyle further with goal to reverse diabetes Continue low cholesterol diet and exercise. Suggestions given for reduced saturated fat intake Check lipid panel.  -     Lipid panel -     TSH  Diet controlled T2DM (HCC) Currently diet controlled; he would prefer to avoid medications Weight loss and lifestyle emphasized; carb reduction reviewed; information provided on AVS Discussed disease and risks Encouraged weight loss -     Hemoglobin A1c  CKD II associated with T2DM (HCC) Increase fluids, avoid NSAIDS, monitor sugars, will monitor  Vitamin D deficiency He has initiated supplement; continue monitoring for goal of 60-100 He has not increased dose since last check; strategies discussed to avoid missed doses Defer vitamin D to next OV  Overweight (BMI 25.0-29.9) Long discussion about weight loss, diet, and exercise Recommended diet heavy in fruits and veggies and low in animal meats, cheeses, and dairy products, appropriate calorie intake Patient will work on portions - try eating off of a salad plate Discussed appropriate weight for height and initial goal (<210lb) Follow up at next visit  GERD Well managed on current medications; PRN tums after problematic meals, 2-3 weekly Discussed diet, avoiding triggers and other lifestyle changes  Medication management -     CBC with Differential/Platelet -     COMPLETE METABOLIC PANEL WITH GFR -     Magnesium  Benign prostatic hyperplasia without lower  urinary tract symptoms Mild intermittent sx; monitor, declines medications  History of elevated PSA Biopsy neg; monitored by urology but hasn't followed up since 2016  Monitor PSA closely, have been stable from baseline on review Plan to recheck 6 months from last at next visit If any further trending up will refer back   History of adenomatous colon polyps Repeat colonoscopy in 2023   Over 30 minutes of exam, counseling, chart review, and critical decision making was performed  Future Appointments  Date Time Provider [place]  06/17/2021 10:00 AM Bryan Pinto, MD [facility] None     Plan:   During the course of the visit the patient was educated and counseled about appropriate screening and preventive services including:   Pneumococcal vaccine  Influenza vaccine Prevnar 13 Td vaccine Screening electrocardiogram Colorectal cancer screening Diabetes screening Glaucoma screening Nutrition counseling    Subjective:  Bryan Morgan is a 80 y.o. male who presents for 3 month follow up and for Medicare Annual Wellness Visit.   He is new to our office in recent years. His wife comes to our office Bryan Morgan) - he is preveiously not well established with PCP, had gone to urgent care as needed.   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   he has a diagnosis of GERD which is currently managed by PRN tums with spicy foods.  he reports symptoms is currently well controlled, and denies breakthrough reflux, burning in chest, hoarseness or cough.  BMI is Body mass index is 29.55 kg/m., he bowls once a week, he reports he walks a lot in his neighborhood. He rides the bus and walks a lot for transportation due to vision, generally very active at work, walks at least 60+ min daily.   Wt Readings from Last 3 Encounters:  10/01/20 224 lb (101.6 kg)  06/04/20 223 lb (101.2 kg)  12/26/19 226 lb (102.5 kg)   His blood pressure has been controlled at home, today their BP is BP: 122/80  He does workout. He denies chest pain, shortness of breath, dizziness.   He is not on cholesterol medication (adamantly declines medications). His cholesterol is not at goal. The cholesterol last visit was:   Lab Results  Component Value Date   CHOL 181 06/04/2020   HDL 37 (L) 06/04/2020   LDLCALC 117 (H) 06/04/2020   TRIG 158 (H) 06/04/2020   CHOLHDL 4.9 06/04/2020    He has been working on diet and exercise for T2 diabetes managed by diet/lifestyle, he declines all medication management, and denies increased appetite, nausea, paresthesia of the feet, polydipsia, polyuria and vomiting. Last A1C in the office was:  Lab Results  Component Value Date   HGBA1C 6.4 (H) 06/04/2020   He has CKD II associated with T2DM monitored at this office:  Lab Results  Component Value Date   GFRAA 71 06/04/2020   Patient is on Vitamin D supplement.   Lab Results  Component Value Date   VD25OH 35 06/04/2020     BPH - endorses frequency intermittently, reports no nocturia; He does have hx of PSA elevations and was followed by urology, underwent biopsy which was negative, was followed for 8 years, patient reported was "released" but per last note received from [facility] Dr. Karsten Morgan saw in 2016 and did recommend a 1 year follow up. Discussed at length today, PSAs have been stable for many years. Will plan to recheck q49m and if trending up any further refer back for follow up. Lab Results  Component Value Date   PSA 11.06 (H) 06/04/2020   PSA 9.8 (H) 09/19/2019   PSA 12.2 (H) 02/06/2019      Medication Review:       Current Outpatient Medications (Other):    cholecalciferol (VITAMIN D3) 25 MCG (1000 UT) tablet, Take 3,000 Units by mouth daily.   Magnesium 500 MG TABS, Take 500 mg by  mouth.  Allergies: No Known Allergies  Current Problems (verified) has Overweight (BMI 25.0-29.9); Benign prostatic hyperplasia without lower urinary tract symptoms; Elevated PSA; Gastroesophageal reflux disease without esophagitis; Hyperlipidemia due to type 2 diabetes mellitus ([place]); Diet-controlled diabetes mellitus ([place]); Vitamin D deficiency; CKD stage 2 due to type 2 diabetes mellitus ([place]); History of adenomatous polyp of colon; Medication therapy management recommendation refused by patient; and Elevated BP without diagnosis of hypertension on their problem list.  Screening Tests Immunization History  Administered Date(s) Administered   Janssen (J&J) SARS-COV-2 Vaccination 08/16/2019, 04/02/2020   Preventative care: Last colonoscopy: 08/2018 after positive cologuard, precancerous polyps per Dr. Hilarie Fredrickson, 3 year follow up   Prior vaccinations: TD or Tdap: never, declines today  Influenza: declines  Pneumococcal: declines Prevnar13: declines Shingles/Zostavax: declines Covid 19: J&J, 2021, 2022  Names of Other Physician/Practitioners you currently use: 1. [facility] here for primary care 2. Dr. Marland Kitchen [place] at [facility], eye doctor, last visit 2017, has glasses, no change in vision due to legally blind  3. [facility], dentist, has  partials and bridges, last visit 2022, goes q30m  Patient Care Team: Bryan Pinto, MD as PCP - General (Internal Medicine)  Surgical: He  has a past surgical history that includes Cataract extraction, bilateral (2007). Family His family history includes COPD in his brother; Diabetes in his sister and sister. Social history  He reports that he has never smoked. He has never used smokeless tobacco. He reports that he does not drink alcohol and does not use drugs.  MEDICARE WELLNESS OBJECTIVES: Physical activity: Current Exercise Habits: Home exercise routine, Type of exercise: walking, Time (Minutes):  45, Frequency (Times/Week): 7, Weekly Exercise (Minutes/Week): 315, Intensity: Mild, Exercise limited by: None identified Cardiac risk factors: Cardiac Risk Factors include: advanced age (>82men, >23 women);dyslipidemia;hypertension;male gender;diabetes mellitus Depression/mood screen:   Depression screen [facility] 2/9 10/01/2020  Decreased Interest 0  Down, Depressed, Hopeless 0  PHQ - 2 Score 0    ADLs:  In your present state of health, do you have any difficulty performing the following activities: 10/01/2020  Hearing? N  Vision? Y  Comment sine birth, does well  Difficulty concentrating or making decisions? N  Walking or climbing stairs? N  Dressing or bathing? N  Doing errands, shopping? N  Comment uses the bus without difficulty  Preparing Food and eating ? N  Using the Toilet? N  In the past six months, have you accidently leaked urine? N  Do you have problems with loss of bowel control? N  Managing your Medications? N  Managing your Finances? N  Housekeeping or managing your Housekeeping? N  Some recent data might be hidden      Cognitive Testing  Alert? Yes  Normal Appearance?Yes  Oriented to person? Yes  Place? Yes   Time? Yes  Recall of three objects?  Yes  Can perform simple calculations? Yes  Displays appropriate judgment?Yes  Can read the correct time from a watch face? N/a - very poor vision   EOL planning: Does Patient Have a Medical Advance Directive?: Yes Type of Advance Directive: Healthcare Power of Attorney, Living will Does patient want to make changes to medical advance directive?: No - Patient declined Copy of [place] in Chart?: No - copy requested   Objective:   Today's Vitals   10/01/20 0929  BP: 122/80  Pulse: 73  Temp: (!) 97.3 F (36.3 C)  SpO2: 97%  Weight: 224 lb (101.6 kg)    Body mass index is 29.55 kg/m.  General appearance: alert, no distress, WD/WN, male HEENT: normocephalic, sclerae anicteric, TMs pearly,  nares patent, no discharge or erythema, pharynx normal, severely poor vision, R pupil is non-reactive/torn, horizontal nystagmus with attempts to focus Oral cavity: MMM, no lesions Neck: supple, no lymphadenopathy, no thyromegaly, no masses Heart: RRR, normal S1, S2, no murmurs Lungs: CTA bilaterally, no wheezes, rhonchi, or rales Abdomen: +bs, soft, non tender, non distended, no masses, no hepatomegaly, no splenomegaly Musculoskeletal: nontender, no swelling, no obvious deformity Extremities: no edema, no cyanosis, no clubbing Pulses: 2+ symmetric, upper and lower extremities, normal cap refill Neurological: alert, oriented x 3, CN2-12 intact (excepting vision testing deferred), strength normal upper extremities and lower extremities, sensation normal throughout, DTRs 2+ throughout, no cerebellar signs, gait normal (slow steady)  Psychiatric: normal affect, behavior normal, pleasant  Skin: albino coloring without rashes or concerning lesions  Medicare Attestation I have personally reviewed: The patient's medical and social history Their use of alcohol, tobacco or illicit drugs Their current medications and supplements The patient's functional ability including  ADLs,fall risks, home safety risks, cognitive, and hearing and visual impairment Diet and physical activities Evidence for depression or mood disorders  The patient's weight, height, BMI, and visual acuity have been recorded in the chart.  I have made referrals, counseling, and provided education to the patient based on review of the above and I have provided the patient with a written personalized care plan for preventive services.     Izora Ribas, NP   10/01/2020

## 2020-10-01 ENCOUNTER — Encounter: Payer: Self-pay | Admitting: Adult Health

## 2020-10-01 ENCOUNTER — Ambulatory Visit (INDEPENDENT_AMBULATORY_CARE_PROVIDER_SITE_OTHER): Payer: Medicare HMO | Admitting: Adult Health

## 2020-10-01 ENCOUNTER — Other Ambulatory Visit: Payer: Self-pay

## 2020-10-01 VITALS — BP 122/80 | HR 73 | Temp 97.3°F | Wt 224.0 lb

## 2020-10-01 DIAGNOSIS — E1122 Type 2 diabetes mellitus with diabetic chronic kidney disease: Secondary | ICD-10-CM | POA: Diagnosis not present

## 2020-10-01 DIAGNOSIS — E1169 Type 2 diabetes mellitus with other specified complication: Secondary | ICD-10-CM

## 2020-10-01 DIAGNOSIS — E559 Vitamin D deficiency, unspecified: Secondary | ICD-10-CM

## 2020-10-01 DIAGNOSIS — R030 Elevated blood-pressure reading, without diagnosis of hypertension: Secondary | ICD-10-CM | POA: Diagnosis not present

## 2020-10-01 DIAGNOSIS — E119 Type 2 diabetes mellitus without complications: Secondary | ICD-10-CM | POA: Diagnosis not present

## 2020-10-01 DIAGNOSIS — K219 Gastro-esophageal reflux disease without esophagitis: Secondary | ICD-10-CM

## 2020-10-01 DIAGNOSIS — N182 Chronic kidney disease, stage 2 (mild): Secondary | ICD-10-CM | POA: Diagnosis not present

## 2020-10-01 DIAGNOSIS — N400 Enlarged prostate without lower urinary tract symptoms: Secondary | ICD-10-CM

## 2020-10-01 DIAGNOSIS — Z0000 Encounter for general adult medical examination without abnormal findings: Secondary | ICD-10-CM

## 2020-10-01 DIAGNOSIS — R972 Elevated prostate specific antigen [PSA]: Secondary | ICD-10-CM | POA: Diagnosis not present

## 2020-10-01 DIAGNOSIS — Z5320 Procedure and treatment not carried out because of patient's decision for unspecified reasons: Secondary | ICD-10-CM | POA: Diagnosis not present

## 2020-10-01 DIAGNOSIS — E785 Hyperlipidemia, unspecified: Secondary | ICD-10-CM | POA: Diagnosis not present

## 2020-10-01 DIAGNOSIS — Z86010 Personal history of colonic polyps: Secondary | ICD-10-CM

## 2020-10-01 DIAGNOSIS — E663 Overweight: Secondary | ICD-10-CM | POA: Diagnosis not present

## 2020-10-01 DIAGNOSIS — Z0001 Encounter for general adult medical examination with abnormal findings: Secondary | ICD-10-CM

## 2020-10-01 DIAGNOSIS — R6889 Other general symptoms and signs: Secondary | ICD-10-CM

## 2020-10-01 NOTE — Patient Instructions (Addendum)
Goals      DIET - INCREASE WATER INTAKE     65-80+ fluid ounces of fluids       LDL CALC < 70     Weight (lb) < 210 lb (95.3 kg)          Please bring copies of notarized advanced directive/living will to put in chart (only medical portions needed)   Recommend limiting/avoiding butter - use olive oil or avocado oil instead   If using cheese use thin sliced and/or reduced fat 2% cheese slices for reduced saturated fat  Increase whole grains (brown rice, old fashioned oats) and beans   Need to get weight down 10-15 lb - please work on smaller portions, skip/limit starchy foods - potatoes, pasta, rice, etc

## 2020-10-02 ENCOUNTER — Encounter: Payer: Self-pay | Admitting: Adult Health

## 2020-10-02 DIAGNOSIS — R7989 Other specified abnormal findings of blood chemistry: Secondary | ICD-10-CM | POA: Insufficient documentation

## 2020-10-02 LAB — CBC WITH DIFFERENTIAL/PLATELET
Absolute Monocytes: 410 cells/uL (ref 200–950)
Basophils Absolute: 18 cells/uL (ref 0–200)
Basophils Relative: 0.4 %
Eosinophils Absolute: 72 cells/uL (ref 15–500)
Eosinophils Relative: 1.6 %
HCT: 42.4 % (ref 38.5–50.0)
Hemoglobin: 13.9 g/dL (ref 13.2–17.1)
Lymphs Abs: 1526 cells/uL (ref 850–3900)
MCH: 29.5 pg (ref 27.0–33.0)
MCHC: 32.8 g/dL (ref 32.0–36.0)
MCV: 90 fL (ref 80.0–100.0)
MPV: 10.6 fL (ref 7.5–12.5)
Monocytes Relative: 9.1 %
Neutro Abs: 2475 cells/uL (ref 1500–7800)
Neutrophils Relative %: 55 %
Platelets: 243 10*3/uL (ref 140–400)
RBC: 4.71 10*6/uL (ref 4.20–5.80)
RDW: 13.7 % (ref 11.0–15.0)
Total Lymphocyte: 33.9 %
WBC: 4.5 10*3/uL (ref 3.8–10.8)

## 2020-10-02 LAB — COMPLETE METABOLIC PANEL WITH GFR
AG Ratio: 1.3 (calc) (ref 1.0–2.5)
ALT: 17 U/L (ref 9–46)
AST: 15 U/L (ref 10–35)
Albumin: 4.3 g/dL (ref 3.6–5.1)
Alkaline phosphatase (APISO): 49 U/L (ref 35–144)
BUN/Creatinine Ratio: 13 (calc) (ref 6–22)
BUN: 17 mg/dL (ref 7–25)
CO2: 32 mmol/L (ref 20–32)
Calcium: 10.3 mg/dL (ref 8.6–10.3)
Chloride: 100 mmol/L (ref 98–110)
Creat: 1.27 mg/dL — ABNORMAL HIGH (ref 0.70–1.11)
GFR, Est African American: 61 mL/min/{1.73_m2} (ref >=60)
GFR, Est Non African American: 53 mL/min/{1.73_m2} — ABNORMAL LOW (ref >=60)
Globulin: 3.2 g/dL (calc) (ref 1.9–3.7)
Glucose, Bld: 96 mg/dL (ref 65–99)
Potassium: 4.6 mmol/L (ref 3.5–5.3)
Sodium: 138 mmol/L (ref 135–146)
Total Bilirubin: 0.5 mg/dL (ref 0.2–1.2)
Total Protein: 7.5 g/dL (ref 6.1–8.1)

## 2020-10-02 LAB — HEMOGLOBIN A1C
Hgb A1c MFr Bld: 6.5 % of total Hgb — ABNORMAL HIGH (ref <5.7)
Mean Plasma Glucose: 140 mg/dL
eAG (mmol/L): 7.7 mmol/L

## 2020-10-02 LAB — LIPID PANEL
Cholesterol: 195 mg/dL (ref <200)
HDL: 38 mg/dL — ABNORMAL LOW (ref >=40)
LDL Cholesterol (Calc): 125 mg/dL (calc) — ABNORMAL HIGH
Non-HDL Cholesterol (Calc): 157 mg/dL (calc) — ABNORMAL HIGH (ref <130)
Total CHOL/HDL Ratio: 5.1 (calc) — ABNORMAL HIGH (ref <5.0)
Triglycerides: 202 mg/dL — ABNORMAL HIGH (ref <150)

## 2020-10-02 LAB — MAGNESIUM: Magnesium: 2.1 mg/dL (ref 1.5–2.5)

## 2020-10-02 LAB — TSH: TSH: 5.16 mIU/L — ABNORMAL HIGH (ref 0.40–4.50)

## 2020-12-10 ENCOUNTER — Other Ambulatory Visit: Payer: Self-pay

## 2020-12-10 ENCOUNTER — Ambulatory Visit (INDEPENDENT_AMBULATORY_CARE_PROVIDER_SITE_OTHER): Payer: Medicare HMO | Admitting: Internal Medicine

## 2020-12-10 VITALS — BP 130/72 | HR 61 | Temp 97.3°F | Resp 16 | Ht 73.0 in | Wt 227.6 lb

## 2020-12-10 DIAGNOSIS — H548 Legal blindness, as defined in USA: Secondary | ICD-10-CM

## 2020-12-10 DIAGNOSIS — H6521 Chronic serous otitis media, right ear: Secondary | ICD-10-CM

## 2020-12-10 MED ORDER — DEXAMETHASONE 4 MG PO TABS: ORAL_TABLET | ORAL | 0 refills | Status: DC

## 2020-12-10 MED ORDER — PSEUDOEPHEDRINE HCL ER 120 MG PO TB12: ORAL_TABLET | ORAL | 2 refills | Status: DC

## 2020-12-12 ENCOUNTER — Encounter: Payer: Self-pay | Admitting: Internal Medicine

## 2020-12-12 DIAGNOSIS — H548 Legal blindness, as defined in USA: Secondary | ICD-10-CM | POA: Insufficient documentation

## 2020-12-12 NOTE — Progress Notes (Signed)
   Future Appointments  Date Time Provider [place]  06/17/2021 10:00 AM Unk Pinto, MD [facility] None  10/03/2021  9:30 AM Liane Comber, NP [facility] None    History of Present Illness:     This very nice 80 y.o. MBM w/Albinism & legal blindness,  labile HTN, HLD, diet T2_DM, hx/o GERD and Vitamin D Deficiency presents with c/o decreased hearing of his right ear for several days.  Medications    VITAMIN D  1000  u , Take 3,000 Units daily.   Magnesium 500 MG TABS, Take 500 mg  Problem list  He has Overweight (BMI 25.0-29.9); Benign prostatic hyperplasia without lower urinary tract symptoms; Elevated PSA; Gastroesophageal reflux disease without esophagitis; Hyperlipidemia due to type 2 diabetes mellitus ([place]); Diet-controlled diabetes mellitus ([place]); Vitamin D deficiency; CKD stage 2 due to type 2 diabetes mellitus ([place]); History of adenomatous polyp of colon; Medication therapy management recommendation refused by patient; Mild hypertension; and Abnormal TSH on their problem list.   Observations/Objective:  BP 130/72   Pulse 61   Temp (!) 97.3 F (36.3 C)   Resp 16   Ht '6\' 1"'$  (1.854 m)   Wt 227 lb 9.6 oz (103.2 kg)   SpO2 98%   BMI 30.03 kg/m   HEENT - Sl anisocoria w/o tonic light reflex and Rt pupil  elliptical- both meiotic. EAC's both patent and sl retracted Rt>Lt. Neck - supple.  Chest - Clear equal BS. Cor - Nl HS. RRR w/o sig M MS- FROM w/o deformities.  Gait Nl. Neuro -  Nl w/o focal abnormalities.  Assessment and Plan:  1. Simple chronic serous otitis media, right  - dexamethasone 4 MG tablet;  Take 1 tab 2 x day for 10 days   Dispense: 20 tablet  - pseudoephedrine  120 MG 12 hr tablet;  Take  1 tablet  2 x /day (every 12 hours)  for Ear Congestion   Dispense: 60 tablet; Refill: 2   Follow Up Instructions:       I discussed the assessment and treatment plan with the patient. The patient was provided an opportunity to ask questions and  all were answered. The patient agreed with the plan and demonstrated an understanding of the instructions.       The patient was advised to call back or seek an in-person evaluation if the symptoms worsen or if the condition fails to improve as anticipated.    Kirtland Bouchard, MD

## 2020-12-29 DIAGNOSIS — N401 Enlarged prostate with lower urinary tract symptoms: Secondary | ICD-10-CM | POA: Diagnosis not present

## 2020-12-29 DIAGNOSIS — N400 Enlarged prostate without lower urinary tract symptoms: Secondary | ICD-10-CM | POA: Diagnosis not present

## 2020-12-29 DIAGNOSIS — N3289 Other specified disorders of bladder: Secondary | ICD-10-CM | POA: Diagnosis not present

## 2020-12-29 DIAGNOSIS — N200 Calculus of kidney: Secondary | ICD-10-CM | POA: Diagnosis not present

## 2020-12-29 DIAGNOSIS — K5730 Diverticulosis of large intestine without perforation or abscess without bleeding: Secondary | ICD-10-CM | POA: Diagnosis not present

## 2020-12-29 DIAGNOSIS — R140 Abdominal distension (gaseous): Secondary | ICD-10-CM | POA: Diagnosis not present

## 2020-12-29 DIAGNOSIS — I714 Abdominal aortic aneurysm, without rupture, unspecified: Secondary | ICD-10-CM | POA: Diagnosis not present

## 2020-12-29 DIAGNOSIS — E1122 Type 2 diabetes mellitus with diabetic chronic kidney disease: Secondary | ICD-10-CM | POA: Diagnosis not present

## 2020-12-29 DIAGNOSIS — N182 Chronic kidney disease, stage 2 (mild): Secondary | ICD-10-CM | POA: Diagnosis not present

## 2020-12-29 DIAGNOSIS — R339 Retention of urine, unspecified: Secondary | ICD-10-CM | POA: Diagnosis not present

## 2020-12-29 DIAGNOSIS — R1084 Generalized abdominal pain: Secondary | ICD-10-CM | POA: Diagnosis not present

## 2020-12-29 DIAGNOSIS — R21 Rash and other nonspecific skin eruption: Secondary | ICD-10-CM | POA: Diagnosis not present

## 2020-12-29 DIAGNOSIS — R1110 Vomiting, unspecified: Secondary | ICD-10-CM | POA: Diagnosis not present

## 2020-12-29 DIAGNOSIS — N281 Cyst of kidney, acquired: Secondary | ICD-10-CM | POA: Diagnosis not present

## 2020-12-30 ENCOUNTER — Other Ambulatory Visit: Payer: Self-pay | Admitting: Internal Medicine

## 2020-12-30 DIAGNOSIS — I7143 Infrarenal abdominal aortic aneurysm, without rupture: Secondary | ICD-10-CM

## 2021-01-12 ENCOUNTER — Other Ambulatory Visit: Payer: Self-pay

## 2021-01-12 DIAGNOSIS — I714 Abdominal aortic aneurysm, without rupture, unspecified: Secondary | ICD-10-CM

## 2021-01-13 ENCOUNTER — Ambulatory Visit (HOSPITAL_COMMUNITY): Admission: RE | Admit: 2021-01-13 | Payer: Medicare HMO | Source: Ambulatory Visit

## 2021-01-13 NOTE — H&P (View-Only) (Signed)
[facility] OF [place]  ASSESSMENT / PLAN: Bryan Morgan is a 80 y.o. 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 He needs a CT angiogram to plan operative repair.  A statement from the [place] [facility] [employer] and Society for Vascular Surgery estimated the annual rupture risk according to AAA diameter to be the following: 7.0 cm to 7.9 cm in diameter - 20% to 40%  The patient is a candidate for elective repair of the aneurysm to prevent rupture.  I explained the risks / benefits / alternatives to different approaches to aortic reconstruction.   After detailed discussion the patient and I agree that the best option for the patient is EVAR.   Recommend the following to reduce the risk of major adverse cardiac / limb events.  Complete cessation from all tobacco products. Excellent blood glucose control with goal A1c < 7%. Blood pressure control with goal blood pressure < 140/90 mmHg. Excellent lipid reduction therapy with goal LDL-C <100 mg/dL. Aspirin 81mg  PO QD.  Atorvastatin 40-80mg  PO QD (or other "high intensity" statin therapy).  Plan EVAR 01/20/21. Needs CT angiogram prior to intervention.   CHIEF COMPLAINT: AAA  HISTORY OF PRESENT ILLNESS: Bryan Morgan is a 80 y.o. male referred to clinic for evaluation of large, asymptomatic infrarenal abdominal aortic aneurysm demonstrated in recent ER work-up for urinary retention.  The patient had greater than 2 L in his bladder when he presented to the ER 11/29/2020.  This was relieved by Foley decompression.  Foley remains in place.  AAA was discovered incidentally on noncontrast CT scan looking for nephrolithiasis.  The patient is asymptomatic from a aneurysm perspective.  He is a very healthy gentleman who is still working in [employer] worker to "stay busy."  VASCULAR SURGICAL HISTORY: None  VASCULAR RISK  FACTORS: Negative history of stroke / transient ischemic attack. Negative history of coronary artery disease.  Negative history of diabetes mellitus.  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 Negative history of smoking.  Negative history of hypertension.  Negative history of chronic kidney disease.  Last GFR 53.  Negative history of chronic obstructive pulmonary disease.  FUNCTIONAL STATUS: ECOG performance status: (0) Fully active, able to carry on all predisease performance without restriction Ambulatory status: Ambulatory within the community without limits  Past Medical History:  Diagnosis Date   AAA (abdominal aortic aneurysm)    History of needle biopsy of prostate with negative result    Legal blindness    Positive colorectal cancer screening using Cologuard test 08/20/2018   08/18/2018 - GI referral placed    Past Surgical History:  Procedure Laterality Date   CATARACT EXTRACTION, BILATERAL  2007    Family History  Problem Relation Age of Onset   Diabetes Sister    COPD Brother    Diabetes Sister    Colon cancer Neg Hx    Esophageal cancer Neg Hx    Stomach cancer Neg Hx    Pancreatic cancer Neg Hx    Liver disease Neg Hx     Social History   Socioeconomic History   Marital status: Married    Spouse name: Not on file   Number of children: 4   Years of education: Not on file   Highest education level: Not on file  Occupational History   Not on file  Tobacco Use   Smoking status: Never   Smokeless  tobacco: Never  Vaping Use   Vaping Use: Never used  Substance and Sexual Activity   Alcohol use: No    Comment: rare, 1 drink twice a year   Drug use: No   Sexual activity: Yes    Partners: Female  Other Topics Concern   Not on file  Social History Narrative   Not on file   Social Determinants of Health   Financial Resource Strain: Not on file  Food Insecurity: Not on file  Transportation Needs: Not on file  Physical Activity:  Not on file  Stress: Not on file  Social Connections: Not on file  Intimate Partner Violence: Not on file    No Known Allergies  Current Outpatient Medications  Medication Sig Dispense Refill   cholecalciferol (VITAMIN D3) 25 MCG (1000 UT) tablet Take 3,000 Units by mouth daily.     Magnesium 500 MG TABS Take 500 mg by mouth.     No current facility-administered medications for this visit.    REVIEW OF SYSTEMS:  [X]  denotes positive finding, [ ]  denotes negative finding Cardiac  Comments:  Chest pain or chest pressure:    Shortness of breath upon exertion:    Short of breath when lying flat:    Irregular heart rhythm:        Vascular    Pain in calf, thigh, or hip brought on by ambulation:    Pain in feet at night that wakes you up from your sleep:     Blood clot in your veins:    Leg swelling:         Pulmonary    Oxygen at home:    Productive cough:     Wheezing:         Neurologic    Sudden weakness in arms or legs:     Sudden numbness in arms or legs:     Sudden onset of difficulty speaking or slurred speech:    Temporary loss of vision in one eye:     Problems with dizziness:         Gastrointestinal    Blood in stool:     Vomited blood:         Genitourinary    Burning when urinating:     Blood in urine:        Psychiatric    Major depression:         Hematologic    Bleeding problems:    Problems with blood clotting too easily:        Skin    Rashes or ulcers:        Constitutional    Fever or chills:      PHYSICAL EXAM Vitals:   01/14/21 0853  BP: 131/79  Pulse: 85  Resp: 20  Temp: 98.2 F (36.8 C)  SpO2: 95%  Weight: 217 lb (98.4 kg)  Height: 6\' 1"  (1.854 m)    Constitutional: well appearing. no distress. Appears well nourished.  Neurologic: CN intact. no focal findings. no sensory loss. Psychiatric:  Mood and affect symmetric and appropriate. Eyes:  No icterus. No conjunctival pallor. Ears, nose, throat:  mucous membranes  moist. Midline trachea.  Cardiac: regular rate and rhythm.  Respiratory:  unlabored. Abdominal:  soft, non-tender, non-distended. No palpable pulsatile mass. Peripheral vascular: 2+ popliteal pulse Extremity: no edema. no cyanosis. no pallor.  Skin: no gangrene. no ulceration.  Lymphatic: no Stemmer's sign. no palpable lymphadenopathy.  PERTINENT LABORATORY AND RADIOLOGIC DATA  Most recent CBC  CBC Latest Ref Rng & Units 10/01/2020 06/04/2020 12/26/2019  WBC 3.8 - 10.8 Thousand/uL 4.5 4.1 4.0  Hemoglobin 13.2 - 17.1 g/dL 13.9 13.9 13.8  Hematocrit 38.5 - 50.0 % 42.4 41.2 41.8  Platelets 140 - 400 Thousand/uL 243 245 229     Most recent CMP CMP Latest Ref Rng & Units 10/01/2020 06/04/2020 12/26/2019  Glucose 65 - 99 mg/dL 96 92 117(H)  BUN 7 - 25 mg/dL 17 17 16   Creatinine 0.70 - 1.11 mg/dL 1.27(H) 1.13(H) 1.20(H)  Sodium 135 - 146 mmol/L 138 139 140  Potassium 3.5 - 5.3 mmol/L 4.6 4.3 4.2  Chloride 98 - 110 mmol/L 100 104 102  CO2 20 - 32 mmol/L 32 28 32  Calcium 8.6 - 10.3 mg/dL 10.3 10.0 10.0  Total Protein 6.1 - 8.1 g/dL 7.5 7.3 7.1  Total Bilirubin 0.2 - 1.2 mg/dL 0.5 0.5 0.4  AST 10 - 35 U/L 15 13 15   ALT 9 - 46 U/L 17 11 15     Renal function CrCl cannot be calculated (Patient's most recent lab result is older than the maximum 21 days allowed.).  Hgb A1c MFr Bld (% of total Hgb)  Date Value  10/01/2020 6.5 (H)    LDL Cholesterol (Calc)  Date Value Ref Range Status  10/01/2020 125 (H) mg/dL (calc) Final    Comment:    Reference range: <100 . Desirable range <100 mg/dL for primary prevention;   <70 mg/dL for patients with CHD or diabetic patients  with > or = 2 CHD risk factors. Marland Kitchen LDL-C is now calculated using the Martin-Hopkins  calculation, which is a validated novel method providing  better accuracy than the Friedewald equation in the  estimation of LDL-C.  Cresenciano Genre et al. Annamaria Helling. 1610;960(45): 2061-2068  (http://education.[employer].com/faq/FAQ164)       Yevonne Aline. Stanford Breed, MD [facility] of [facility] Phone Number: [phone] 01/14/2021 9:59 AM  Total time spent on preparing this encounter including chart review, data review, collecting history, examining the patient, coordinating care for this new patient, 60 minutes.  Portions of this report may have been transcribed using voice recognition software.  Every effort has been made to ensure accuracy; however, inadvertent computerized transcription errors may still be present.

## 2021-01-13 NOTE — Progress Notes (Signed)
[facility]IN [facility] OF [place]  ASSESSMENT / PLAN: Bryan Morgan is a 80 y.o. male with a infrarenal abdominal aortic aneurysm measuring 52mm on recent non contrast scan. He needs a CT angiogram to plan operative repair.  A statement from the [place] for Vascular Surgery and Society for Vascular Surgery estimated the annual rupture risk according to AAA diameter to be the following: 7.0 cm to 7.9 cm in diameter - 20% to 40%  The patient is a candidate for elective repair of the aneurysm to prevent rupture.  I explained the risks / benefits / alternatives to different approaches to aortic reconstruction.   After detailed discussion the patient and I agree that the best option for the patient is EVAR.   Recommend the following to reduce the risk of major adverse cardiac / limb events.  Complete cessation from all tobacco products. Excellent blood glucose control with goal A1c < 7%. Blood pressure control with goal blood pressure < 140/90 mmHg. Excellent lipid reduction therapy with goal LDL-C <100 mg/dL. Aspirin 81mg  PO QD.  Atorvastatin 40-80mg  PO QD (or other "high intensity" statin therapy).  Plan EVAR 01/20/21. Needs CT angiogram prior to intervention.   CHIEF COMPLAINT: AAA  HISTORY OF PRESENT ILLNESS: Bryan Morgan is a 80 y.o. male referred to clinic for evaluation of large, asymptomatic infrarenal abdominal aortic aneurysm demonstrated in recent ER work-up for urinary retention.  The patient had greater than 2 L in his bladder when he presented to the ER 11/29/2020.  This was relieved by Foley decompression.  Foley remains in place.  AAA was discovered incidentally on noncontrast CT scan looking for nephrolithiasis.  The patient is asymptomatic from a aneurysm perspective.  He is a very healthy gentleman who is still working in [employer] to "stay busy."  VASCULAR SURGICAL HISTORY: None  VASCULAR RISK  FACTORS: Negative history of stroke / transient ischemic attack. Negative history of coronary artery disease.  Negative history of diabetes mellitus.  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 Negative history of smoking.  Negative history of hypertension.  Negative history of chronic kidney disease.  Last GFR 53.  Negative history of chronic obstructive pulmonary disease.  FUNCTIONAL STATUS: ECOG performance status: (0) Fully active, able to carry on all predisease performance without restriction Ambulatory status: Ambulatory within the community without limits  Past Medical History:  Diagnosis Date   AAA (abdominal aortic aneurysm)    History of needle biopsy of prostate with negative result    Legal blindness    Positive colorectal cancer screening using Cologuard test 08/20/2018   08/18/2018 - GI referral placed    Past Surgical History:  Procedure Laterality Date   CATARACT EXTRACTION, BILATERAL  2007    Family History  Problem Relation Age of Onset   Diabetes Sister    COPD Brother    Diabetes Sister    Colon cancer Neg Hx    Esophageal cancer Neg Hx    Stomach cancer Neg Hx    Pancreatic cancer Neg Hx    Liver disease Neg Hx     Social History   Socioeconomic History   Marital status: Married    Spouse name: Not on file   Number of children: 4   Years of education: Not on file   Highest education level: Not on file  Occupational History   Not on file  Tobacco Use   Smoking status: Never   Smokeless  tobacco: Never  Vaping Use   Vaping Use: Never used  Substance and Sexual Activity   Alcohol use: No    Comment: rare, 1 drink twice a year   Drug use: No   Sexual activity: Yes    Partners: Female  Other Topics Concern   Not on file  Social History Narrative   Not on file   Social Determinants of Health   Financial Resource Strain: Not on file  Food Insecurity: Not on file  Transportation Needs: Not on file  Physical Activity:  Not on file  Stress: Not on file  Social Connections: Not on file  Intimate Partner Violence: Not on file    No Known Allergies  Current Outpatient Medications  Medication Sig Dispense Refill   cholecalciferol (VITAMIN D3) 25 MCG (1000 UT) tablet Take 3,000 Units by mouth daily.     Magnesium 500 MG TABS Take 500 mg by mouth.     No current facility-administered medications for this visit.    REVIEW OF SYSTEMS:  [X]  denotes positive finding, [ ]  denotes negative finding Cardiac  Comments:  Chest pain or chest pressure:    Shortness of breath upon exertion:    Short of breath when lying flat:    Irregular heart rhythm:        Vascular    Pain in calf, thigh, or hip brought on by ambulation:    Pain in feet at night that wakes you up from your sleep:     Blood clot in your veins:    Leg swelling:         Pulmonary    Oxygen at home:    Productive cough:     Wheezing:         Neurologic    Sudden weakness in arms or legs:     Sudden numbness in arms or legs:     Sudden onset of difficulty speaking or slurred speech:    Temporary loss of vision in one eye:     Problems with dizziness:         Gastrointestinal    Blood in stool:     Vomited blood:         Genitourinary    Burning when urinating:     Blood in urine:        Psychiatric    Major depression:         Hematologic    Bleeding problems:    Problems with blood clotting too easily:        Skin    Rashes or ulcers:        Constitutional    Fever or chills:      PHYSICAL EXAM Vitals:   01/14/21 0853  BP: 131/79  Pulse: 85  Resp: 20  Temp: 98.2 F (36.8 C)  SpO2: 95%  Weight: 217 lb (98.4 kg)  Height: 6\' 1"  (1.854 m)    Constitutional: well appearing. no distress. Appears well nourished.  Neurologic: CN intact. no focal findings. no sensory loss. Psychiatric:  Mood and affect symmetric and appropriate. Eyes:  No icterus. No conjunctival pallor. Ears, nose, throat:  mucous membranes  moist. Midline trachea.  Cardiac: regular rate and rhythm.  Respiratory:  unlabored. Abdominal:  soft, non-tender, non-distended. No palpable pulsatile mass. Peripheral vascular: 2+ popliteal pulse Extremity: no edema. no cyanosis. no pallor.  Skin: no gangrene. no ulceration.  Lymphatic: no Stemmer's sign. no palpable lymphadenopathy.  PERTINENT LABORATORY AND RADIOLOGIC DATA  Most recent CBC  CBC Latest Ref Rng & Units 10/01/2020 06/04/2020 12/26/2019  WBC 3.8 - 10.8 Thousand/uL 4.5 4.1 4.0  Hemoglobin 13.2 - 17.1 g/dL 13.9 13.9 13.8  Hematocrit 38.5 - 50.0 % 42.4 41.2 41.8  Platelets 140 - 400 Thousand/uL 243 245 229     Most recent CMP CMP Latest Ref Rng & Units 10/01/2020 06/04/2020 12/26/2019  Glucose 65 - 99 mg/dL 96 92 117(H)  BUN 7 - 25 mg/dL 17 17 16   Creatinine 0.70 - 1.11 mg/dL 1.27(H) 1.13(H) 1.20(H)  Sodium 135 - 146 mmol/L 138 139 140  Potassium 3.5 - 5.3 mmol/L 4.6 4.3 4.2  Chloride 98 - 110 mmol/L 100 104 102  CO2 20 - 32 mmol/L 32 28 32  Calcium 8.6 - 10.3 mg/dL 10.3 10.0 10.0  Total Protein 6.1 - 8.1 g/dL 7.5 7.3 7.1  Total Bilirubin 0.2 - 1.2 mg/dL 0.5 0.5 0.4  AST 10 - 35 U/L 15 13 15   ALT 9 - 46 U/L 17 11 15     Renal function CrCl cannot be calculated (Patient's most recent lab result is older than the maximum 21 days allowed.).  Hgb A1c MFr Bld (% of total Hgb)  Date Value  10/01/2020 6.5 (H)    LDL Cholesterol (Calc)  Date Value Ref Range Status  10/01/2020 125 (H) mg/dL (calc) Final    Comment:    Reference range: <100 . Desirable range <100 mg/dL for primary prevention;   <70 mg/dL for patients with CHD or diabetic patients  with > or = 2 CHD risk factors. Marland Kitchen LDL-C is now calculated using the Martin-Hopkins  calculation, which is a validated novel method providing  better accuracy than the Friedewald equation in the  estimation of LDL-C.  Cresenciano Genre et al. Annamaria Helling. 4196;222(97): 2061-2068  (http://education.[facility].com/faq/FAQ164)       Yevonne Aline. Stanford Breed, MD [facility] Special[facility] of [facility] Phone Number: [phone] 01/14/2021 9:59 AM  Total time spent on preparing this encounter including chart review, data review, collecting history, examining the patient, coordinating care for this new patient, 60 minutes.  Portions of this report may have been transcribed using voice recognition software.  Every effort has been made to ensure accuracy; however, inadvertent computerized transcription errors may still be present.

## 2021-01-14 ENCOUNTER — Other Ambulatory Visit: Payer: Self-pay

## 2021-01-14 ENCOUNTER — Ambulatory Visit (INDEPENDENT_AMBULATORY_CARE_PROVIDER_SITE_OTHER): Payer: Medicare HMO | Admitting: Vascular Surgery

## 2021-01-14 ENCOUNTER — Encounter: Payer: Self-pay | Admitting: Vascular Surgery

## 2021-01-14 VITALS — BP 131/79 | HR 85 | Temp 98.2°F | Resp 20 | Ht 73.0 in | Wt 217.0 lb

## 2021-01-14 DIAGNOSIS — I7143 Infrarenal abdominal aortic aneurysm, without rupture: Secondary | ICD-10-CM

## 2021-01-14 DIAGNOSIS — I714 Abdominal aortic aneurysm, without rupture, unspecified: Secondary | ICD-10-CM

## 2021-01-18 ENCOUNTER — Other Ambulatory Visit: Payer: Self-pay

## 2021-01-20 NOTE — Pre-Procedure Instructions (Signed)
Surgical Instructions    Your procedure is scheduled on Thursday, November 3rd.  Report to [facility] Main Entrance "A" at 08:00 A.M., then check in with the Admitting office.  Call this number if you have problems the morning of surgery:  [phone]   If you have any questions prior to your surgery date call [phone]: Open Monday-Friday 8am-4pm    Remember:  Do not eat or drink after midnight the night before your surgery      Take these medicines the morning of surgery with A SIP OF WATER: none  As of today, STOP taking any Aspirin (unless otherwise instructed by your surgeon) Aleve, Naproxen, Ibuprofen, Motrin, Advil, Goody's, BC's, all herbal medications, fish oil, and all vitamins.                     Do NOT Smoke (Tobacco/Vaping) or drink Alcohol 24 hours prior to your procedure.  If you use a CPAP at night, you may bring all equipment for your overnight stay.   Contacts, glasses, piercing's, hearing aid's, dentures or partials may not be worn into surgery, please bring cases for these belongings.    For patients admitted to the hospital, discharge time will be determined by your treatment team.   Patients discharged the day of surgery will not be allowed to drive home, and someone needs to stay with them for 24 hours.  NO VISITORS WILL BE ALLOWED IN PRE-OP WHERE PATIENTS GET READY FOR SURGERY.  ONLY 1 SUPPORT PERSON MAY BE PRESENT IN THE WAITING ROOM WHILE YOU ARE IN SURGERY.  IF YOU ARE TO BE ADMITTED, ONCE YOU ARE IN YOUR ROOM YOU WILL BE ALLOWED TWO (2) VISITORS.  Minor children may have two parents present. Special consideration for safety and communication needs will be reviewed on a case by case basis.   Special instructions:   [facility]- Preparing For Surgery  Before surgery, you can play an important role. Because skin is not sterile, your skin needs to be as free of germs as possible. You can reduce the number of germs on your skin by washing with CHG  (chlorahexidine gluconate) Soap before surgery.  CHG is an antiseptic cleaner which kills germs and bonds with the skin to continue killing germs even after washing.    Oral Hygiene is also important to reduce your risk of infection.  Remember - BRUSH YOUR TEETH THE MORNING OF SURGERY WITH YOUR REGULAR TOOTHPASTE  Please do not use if you have an allergy to CHG or antibacterial soaps. If your skin becomes reddened/irritated stop using the CHG.  Do not shave (including legs and underarms) for at least 48 hours prior to first CHG shower. It is OK to shave your face.  Please follow these instructions carefully.   Shower the NIGHT BEFORE SURGERY and the MORNING OF SURGERY  If you chose to wash your hair, wash your hair first as usual with your normal shampoo.  After you shampoo, rinse your hair and body thoroughly to remove the shampoo.  Use CHG Soap as you would any other liquid soap. You can apply CHG directly to the skin and wash gently with a scrungie or a clean washcloth.   Apply the CHG Soap to your body ONLY FROM THE NECK DOWN.  Do not use on open wounds or open sores. Avoid contact with your eyes, ears, mouth and genitals (private parts). Wash Face and genitals (private parts)  with your normal soap.   Wash thoroughly, paying  special attention to the area where your surgery will be performed.  Thoroughly rinse your body with warm water from the neck down.  DO NOT shower/wash with your normal soap after using and rinsing off the CHG Soap.  Pat yourself dry with a CLEAN TOWEL.  Wear CLEAN PAJAMAS to bed the night before surgery  Place CLEAN SHEETS on your bed the night before your surgery  DO NOT SLEEP WITH PETS.   Day of Surgery: Shower with CHG soap. Do not wear jewelry Do not wear lotions, powders, colognes, or deodorant. Men may shave face and neck. Do not bring valuables to the hospital. [facility] is not responsible for any belongings or valuables. Wear  Clean/Comfortable clothing the morning of surgery Remember to brush your teeth WITH YOUR REGULAR TOOTHPASTE.   Please read over the following fact sheets that you were given.   3 days prior to your procedure or After your COVID test   You are not required to quarantine however you are required to wear a well-fitting mask when you are out and around people not in your household. If your mask becomes wet or soiled, replace with a new one.   Wash your hands often with soap and water for 20 seconds or clean your hands with an alcohol-based hand sanitizer that contains at least 60% alcohol.   Do not share personal items.   Notify your provider:  o if you are in close contact with someone who has COVID  o or if you develop a fever of 100.4 or greater, sneezing, cough, sore throat, shortness of breath or body aches.

## 2021-01-21 ENCOUNTER — Other Ambulatory Visit: Payer: Self-pay

## 2021-01-21 ENCOUNTER — Encounter (HOSPITAL_COMMUNITY)
Admission: RE | Admit: 2021-01-21 | Discharge: 2021-01-21 | Disposition: A | Discharge status: Home / Self Care | Payer: Medicare HMO | Source: Ambulatory Visit | Attending: Vascular Surgery | Admitting: Vascular Surgery

## 2021-01-21 ENCOUNTER — Encounter (HOSPITAL_COMMUNITY): Payer: Self-pay

## 2021-01-21 VITALS — BP 147/91 | HR 81 | Temp 97.8°F | Resp 19 | Ht 74.0 in | Wt 208.8 lb

## 2021-01-21 DIAGNOSIS — Z01818 Encounter for other preprocedural examination: Secondary | ICD-10-CM

## 2021-01-21 DIAGNOSIS — R338 Other retention of urine: Secondary | ICD-10-CM | POA: Diagnosis not present

## 2021-01-21 DIAGNOSIS — I714 Abdominal aortic aneurysm, without rupture, unspecified: Secondary | ICD-10-CM | POA: Insufficient documentation

## 2021-01-21 DIAGNOSIS — Z01812 Encounter for preprocedural laboratory examination: Secondary | ICD-10-CM | POA: Diagnosis present

## 2021-01-21 DIAGNOSIS — H548 Legal blindness, as defined in USA: Secondary | ICD-10-CM | POA: Insufficient documentation

## 2021-01-21 DIAGNOSIS — R7303 Prediabetes: Secondary | ICD-10-CM | POA: Insufficient documentation

## 2021-01-21 DIAGNOSIS — N401 Enlarged prostate with lower urinary tract symptoms: Secondary | ICD-10-CM | POA: Diagnosis not present

## 2021-01-21 DIAGNOSIS — N281 Cyst of kidney, acquired: Secondary | ICD-10-CM | POA: Diagnosis not present

## 2021-01-21 HISTORY — DX: Benign prostatic hyperplasia without lower urinary tract symptoms: N40.0

## 2021-01-21 LAB — TYPE AND SCREEN
ABO/RH(D): A POS
Antibody Screen: NEGATIVE

## 2021-01-21 LAB — COMPREHENSIVE METABOLIC PANEL
ALT: 19 U/L (ref 0–44)
AST: 18 U/L (ref 15–41)
Albumin: 3.4 g/dL — ABNORMAL LOW (ref 3.5–5.0)
Alkaline Phosphatase: 56 U/L (ref 38–126)
Anion gap: 7 (ref 5–15)
BUN: 14 mg/dL (ref 8–23)
CO2: 28 mmol/L (ref 22–32)
Calcium: 9 mg/dL (ref 8.9–10.3)
Chloride: 98 mmol/L (ref 98–111)
Creatinine, Ser: 1.28 mg/dL — ABNORMAL HIGH (ref 0.61–1.24)
GFR, Estimated: 57 mL/min — ABNORMAL LOW (ref >60)
Glucose, Bld: 98 mg/dL (ref 70–99)
Potassium: 3.9 mmol/L (ref 3.5–5.1)
Sodium: 133 mmol/L — ABNORMAL LOW (ref 135–145)
Total Bilirubin: 0.7 mg/dL (ref 0.3–1.2)
Total Protein: 6.9 g/dL (ref 6.5–8.1)

## 2021-01-21 LAB — CBC
HCT: 41.3 % (ref 39.0–52.0)
Hemoglobin: 13.1 g/dL (ref 13.0–17.0)
MCH: 29.4 pg (ref 26.0–34.0)
MCHC: 31.7 g/dL (ref 30.0–36.0)
MCV: 92.8 fL (ref 80.0–100.0)
Platelets: 285 10*3/uL (ref 150–400)
RBC: 4.45 MIL/uL (ref 4.22–5.81)
RDW: 13.6 % (ref 11.5–15.5)
WBC: 5 10*3/uL (ref 4.0–10.5)
nRBC: 0 % (ref 0.0–0.2)

## 2021-01-21 LAB — SURGICAL PCR SCREEN
MRSA, PCR: NEGATIVE
Staphylococcus aureus: NEGATIVE

## 2021-01-21 LAB — PROTIME-INR
INR: 1 (ref 0.8–1.2)
Prothrombin Time: 13.4 seconds (ref 11.4–15.2)

## 2021-01-21 LAB — APTT: aPTT: 31 seconds (ref 24–36)

## 2021-01-21 NOTE — Progress Notes (Signed)
PCP - Dr. Unk Pinto Cardiologist - denies  PPM/ICD - denies  Chest x-ray - denies EKG - 06/04/20 Stress Test - denies ECHO - denies Cardiac Cath - denies  Sleep Study - denies   DM- denies  Blood Thinner Instructions: n/a Aspirin Instructions: n/a  ERAS Protcol - no, NPO   COVID TEST- pt scheduled for testing on 01/25/21   Anesthesia review: yes, AAA. LVM for Kia Breedlove regarding orders for UA. Pt has a urostomy. Unable to obtain. Left order in.  Patient denies shortness of breath, fever, cough and chest pain at PAT appointment   All instructions explained to the patient, with a verbal understanding of the material. Patient agrees to go over the instructions while at home for a better understanding. Patient also instructed to wear a mask in public after being tested for COVID-19. The opportunity to ask questions was provided.

## 2021-01-21 NOTE — Progress Notes (Addendum)
Surgeon orders for pt to have a UA at PAT appt. Pt has a urostomy. Contacted Kia Breedlove. She said to just cancel the order.

## 2021-01-22 ENCOUNTER — Encounter (HOSPITAL_COMMUNITY): Payer: Self-pay | Admitting: Emergency Medicine

## 2021-01-22 ENCOUNTER — Emergency Department (HOSPITAL_COMMUNITY)
Admission: EM | Admit: 2021-01-22 | Discharge: 2021-01-22 | Disposition: A | Discharge status: Home / Self Care | Payer: Medicare HMO | Attending: Student | Admitting: Student

## 2021-01-22 ENCOUNTER — Other Ambulatory Visit: Payer: Self-pay

## 2021-01-22 DIAGNOSIS — E1122 Type 2 diabetes mellitus with diabetic chronic kidney disease: Secondary | ICD-10-CM | POA: Diagnosis not present

## 2021-01-22 DIAGNOSIS — Y732 Prosthetic and other implants, materials and accessory gastroenterology and urology devices associated with adverse incidents: Secondary | ICD-10-CM | POA: Insufficient documentation

## 2021-01-22 DIAGNOSIS — I129 Hypertensive chronic kidney disease with stage 1 through stage 4 chronic kidney disease, or unspecified chronic kidney disease: Secondary | ICD-10-CM | POA: Insufficient documentation

## 2021-01-22 DIAGNOSIS — T83098A Other mechanical complication of other indwelling urethral catheter, initial encounter: Secondary | ICD-10-CM | POA: Diagnosis not present

## 2021-01-22 DIAGNOSIS — R339 Retention of urine, unspecified: Secondary | ICD-10-CM | POA: Diagnosis not present

## 2021-01-22 DIAGNOSIS — T83091A Other mechanical complication of indwelling urethral catheter, initial encounter: Secondary | ICD-10-CM | POA: Insufficient documentation

## 2021-01-22 DIAGNOSIS — N182 Chronic kidney disease, stage 2 (mild): Secondary | ICD-10-CM | POA: Diagnosis not present

## 2021-01-22 DIAGNOSIS — T839XXA Unspecified complication of genitourinary prosthetic device, implant and graft, initial encounter: Secondary | ICD-10-CM

## 2021-01-22 NOTE — ED Triage Notes (Signed)
Patient noticed no drainage at leg bag onset last night , foley catheter placed at urologist clinic yesterday .

## 2021-01-22 NOTE — Discharge Instructions (Addendum)
Follow up with Urology as scheduled.

## 2021-01-22 NOTE — ED Provider Notes (Signed)
[facility] EMERGENCY DEPARTMENT Provider Note   CSN: [id number] Arrival date & time: 01/22/21  0555     History Chief Complaint  Patient presents with   Clogged Urinary Catheter    Bryan Morgan is a 80 y.o. male.  The history is provided by the patient. No language interpreter was used.  Pt had a foley placed 12/29/2020 at [facility] ED.  Pt saw urology and had foley replaced yesterday.  Pt reports foley is leaking and not draining.  Pt complains of abdomen feeling tight.  Pt was told he has an enlarged prostate.  Urologist started him on medication yesterday.  Pt  is schedule to recheck in 10 days     Past Medical History:  Diagnosis Date   AAA (abdominal aortic aneurysm)    History of needle biopsy of prostate with negative result    Legal blindness    Positive colorectal cancer screening using Cologuard test 08/20/2018   08/18/2018 - GI referral placed    Patient Active Problem List   Diagnosis Date Noted   Legal blindness 12/12/2020   Abnormal TSH 10/02/2020   Mild hypertension 12/26/2019   Medication therapy management recommendation refused by patient 05/16/2019   History of adenomatous polyp of colon 11/04/2018   CKD stage 2 due to type 2 diabetes mellitus ([place]) 08/12/2018   Hyperlipidemia due to type 2 diabetes mellitus ([place]) 08/06/2018   Diet-controlled diabetes mellitus ([place]) 08/06/2018   Vitamin D deficiency 08/06/2018   Benign prostatic hyperplasia without lower urinary tract symptoms 08/05/2018   Elevated PSA 08/05/2018   Gastroesophageal reflux disease without esophagitis 08/05/2018   Overweight (BMI 25.0-29.9) 07/31/2018    Past Surgical History:  Procedure Laterality Date   CATARACT EXTRACTION, BILATERAL  2007       Family History  Problem Relation Age of Onset   Diabetes Sister    COPD Brother    Diabetes Sister    Colon cancer Neg Hx    Esophageal cancer Neg Hx    Stomach cancer Neg Hx    Pancreatic cancer Neg Hx     Liver disease Neg Hx     Social History   Tobacco Use   Smoking status: Never   Smokeless tobacco: Never  Vaping Use   Vaping Use: Never used  Substance Use Topics   Alcohol use: No    Comment: rare, 1 drink twice a year   Drug use: No    Home Medications Prior to Admission medications   Medication Sig Start Date End Date Taking? Authorizing Provider  Cholecalciferol (VITAMIN D) 125 MCG (5000 UT) CAPS Take 5,000 Units by mouth daily.    [provider]  Magnesium 500 MG TABS Take 500 mg by mouth.    [provider]    Allergies    Patient has no known allergies.  Review of Systems   Review of Systems  All other systems reviewed and are negative.  Physical Exam Updated Vital Signs BP (!) 144/93   Pulse 79   Temp 97.9 F (36.6 C)   Resp 18   Ht 6\' 2"  (1.88 m)   Wt 108 kg   SpO2 100%   BMI 30.57 kg/m   Physical Exam Vitals and nursing note reviewed.  Constitutional:      Appearance: He is well-developed.  HENT:     Head: Normocephalic and atraumatic.  Eyes:     Conjunctiva/sclera: Conjunctivae normal.  Cardiovascular:     Rate and Rhythm: Normal rate and  regular rhythm.     Heart sounds: No murmur heard. Pulmonary:     Effort: Pulmonary effort is normal. No respiratory distress.     Breath sounds: Normal breath sounds.  Abdominal:     Palpations: Abdomen is soft.     Tenderness: There is no abdominal tenderness.     Comments: Bladder distended  Musculoskeletal:     Cervical back: Neck supple.  Skin:    General: Skin is warm and dry.  Neurological:     Mental Status: He is alert.    ED Results / Procedures / Treatments   Labs (all labs ordered are listed, but only abnormal results are displayed) Labs Reviewed - No data to display  EKG None  Radiology No results found.  Procedures Procedures   Medications Ordered in ED Medications - No data to display  ED Course  I have reviewed the triage vital signs and the nursing  notes.  Pertinent labs & imaging results that were available during my care of the patient were reviewed by me and considered in my medical decision making (see chart for details).    MDM Rules/Calculators/A&P                          MDM;  Pt has over 600 cc in bladder on bladder scan.  Foley removed and replaced.  Pt reports he feels much better,  700cc out.  Pt advised to return if any problems.   Final Clinical Impression(s) / ED Diagnoses Final diagnoses:  Urinary retention  Complication of Foley catheter, initial encounter [facility])    Rx / DC Orders ED Discharge Orders     None     An After Visit Summary was printed and given to the patient.    Sidney Ace 01/22/21 1217    Teressa Lower, MD 01/22/21 1816

## 2021-01-22 NOTE — ED Notes (Signed)
Cab called for pt.

## 2021-01-24 ENCOUNTER — Other Ambulatory Visit: Payer: Self-pay

## 2021-01-24 ENCOUNTER — Ambulatory Visit (HOSPITAL_COMMUNITY)
Admission: RE | Admit: 2021-01-24 | Discharge: 2021-01-24 | Disposition: A | Discharge status: Home / Self Care | Payer: Medicare HMO | Source: Ambulatory Visit | Attending: Vascular Surgery | Admitting: Vascular Surgery

## 2021-01-24 ENCOUNTER — Other Ambulatory Visit (HOSPITAL_COMMUNITY)
Admission: RE | Admit: 2021-01-24 | Discharge: 2021-01-24 | Disposition: A | Discharge status: Home / Self Care | Payer: Medicare HMO | Source: Ambulatory Visit | Attending: Vascular Surgery | Admitting: Vascular Surgery

## 2021-01-24 ENCOUNTER — Encounter (HOSPITAL_COMMUNITY): Payer: Self-pay

## 2021-01-24 DIAGNOSIS — Z20822 Contact with and (suspected) exposure to covid-19: Secondary | ICD-10-CM | POA: Insufficient documentation

## 2021-01-24 DIAGNOSIS — I700 Atherosclerosis of aorta: Secondary | ICD-10-CM | POA: Insufficient documentation

## 2021-01-24 DIAGNOSIS — N400 Enlarged prostate without lower urinary tract symptoms: Secondary | ICD-10-CM | POA: Insufficient documentation

## 2021-01-24 DIAGNOSIS — Z01818 Encounter for other preprocedural examination: Secondary | ICD-10-CM

## 2021-01-24 DIAGNOSIS — I2510 Atherosclerotic heart disease of native coronary artery without angina pectoris: Secondary | ICD-10-CM | POA: Insufficient documentation

## 2021-01-24 DIAGNOSIS — I714 Abdominal aortic aneurysm, without rupture, unspecified: Secondary | ICD-10-CM | POA: Insufficient documentation

## 2021-01-24 DIAGNOSIS — N281 Cyst of kidney, acquired: Secondary | ICD-10-CM | POA: Insufficient documentation

## 2021-01-24 LAB — SARS CORONAVIRUS 2 (TAT 6-24 HRS): SARS Coronavirus 2: NEGATIVE

## 2021-01-24 MED ORDER — IOHEXOL 350 MG/ML SOLN
100.0000 mL | Freq: Once | INTRAVENOUS | Status: AC | PRN
  Administered 2021-01-24: 100 mL via INTRAVENOUS

## 2021-01-24 NOTE — Anesthesia Preprocedure Evaluation (Addendum)
Anesthesia Evaluation  Patient identified by MRN, date of birth, ID band Patient awake    Reviewed: Allergy & Precautions, NPO status , Patient's Chart, lab work & pertinent test results  History of Anesthesia Complications Negative for: history of anesthetic complications  Airway Mallampati: II  TM Distance: >3 FB Neck ROM: Full    Dental no notable dental hx. (+) Dental Advisory Given   Pulmonary neg pulmonary ROS,    Pulmonary exam normal        Cardiovascular hypertension, + Peripheral Vascular Disease  Normal cardiovascular exam     Neuro/Psych negative neurological ROS     GI/Hepatic Neg liver ROS, GERD  ,  Endo/Other  diabetes  Renal/GU negative Renal ROS     Musculoskeletal negative musculoskeletal ROS (+)   Abdominal   Peds  Hematology negative hematology ROS (+)   Anesthesia Other Findings   Reproductive/Obstetrics                            Anesthesia Physical Anesthesia Plan  ASA: 3  Anesthesia Plan: General   Post-op Pain Management:    Induction: Intravenous  PONV Risk Score and Plan: 2 and Ondansetron and Dexamethasone  Airway Management Planned: Oral ETT  Additional Equipment: Arterial line  Intra-op Plan:   Post-operative Plan: Extubation in OR  Informed Consent: I have reviewed the patients History and Physical, chart, labs and discussed the procedure including the risks, benefits and alternatives for the proposed anesthesia with the patient or authorized representative who has indicated his/her understanding and acceptance.     Dental advisory given  Plan Discussed with: Anesthesiologist and CRNA  Anesthesia Plan Comments: (PAT note written 01/24/2021 by Myra Gianotti, PA-C. )      Anesthesia Quick Evaluation

## 2021-01-24 NOTE — Progress Notes (Signed)
Anesthesia Chart Review:  Case: [id number] Date/Time: 01/27/21 0944   Procedure: ABDOMINAL AORTIC ENDOVASCULAR STENT GRAFT REPAIR   Anesthesia type: General   Pre-op diagnosis: AAA   Location: [place] OR ROOM 73 / [place] OR   Surgeons: Cherre Robins, MD       DISCUSSION: Patient is 80 year old male scheduled for the above procedure. Large 72 mm AAA found on 12/29/20 CT at [facility] for urinary retention evaluation. Foley catheter placed, last exchanged 01/22/21.   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).  01/24/2021 presurgical COVID-19 test negative.  Anesthesia team to evaluate on the day of surgery.  VS: BP (!) 147/91   Pulse 81   Temp 36.6 C (Oral)   Resp 19   Ht 6\' 2"  (1.88 m)   Wt 94.7 kg   SpO2 99%   BMI 26.81 kg/m    PROVIDERS: Unk Pinto, MD is PCP  Current urologist is not documented. He saw urologist Kathie Rhodes, MD in 2016.    LABS: Labs reviewed: Acceptable for surgery. A1c 6.5% 10/01/20.  01/08/2021 UA at [facility] was negative for nitrites and leukocytes.  Last Foley exchange 01/22/2021. (all labs ordered are listed, but only abnormal results are displayed)  Labs Reviewed  COMPREHENSIVE METABOLIC PANEL - Abnormal; Notable for the following components:      Result Value   Sodium 133 (*)    Creatinine, Ser 1.28 (*)    Albumin 3.4 (*)    GFR, Estimated 57 (*)    All other components within normal limits  SURGICAL PCR SCREEN  CBC  PROTIME-INR  APTT  TYPE AND SCREEN     IMAGES: CTA abd/pelvis 01/24/21 (ordered by Jamelle Haring, MD): IMPRESSION: VASCULAR  1. 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 The saccular  aneurysm measures up to 7.0 cm. Recommend referral to a vascular specialist. This recommendation follows ACR consensus guidelines: White Paper of the ACR Incidental Findings Committee II on Vascular Findings. J Am Coll Radiol 2013; 10:789-794. 2. Coronary artery calcifications. 3.  Aortic Atherosclerosis (ICD10-I70.0).  NON-VASCULAR  1. Prostate enlargement and asymmetric enlargement of the right seminal vesicle. In addition, there is probably bladder wall thickening with mild bladder wall stranding which could be chronic and related to chronic bladder outlet obstruction. Foley catheter is present. 2. **An incidental finding of potential clinical significance has been found. Mildly prominent pelvic lymph nodes with suspicious right perirectal lymph nodes. These lymph nodes could be reactive but a neoplastic process in the pelvis cannot be excluded. ** 3. Multiple large bilateral renal cysts. Negative for hydronephrosis.   EKG: 06/04/20 ([facility]): NSR. Reverse R wave progression V1-V2. Non-specific ST/T wave abnormality.    CV: N/A  Past Medical History:  Diagnosis Date   AAA (abdominal aortic aneurysm)    BPH (benign prostatic hyperplasia)    with retention, s/p foley catheter placement in [place] ED 12/29/20   History of needle biopsy of prostate with negative result    Legal blindness    Positive colorectal cancer screening using Cologuard test 08/20/2018   08/18/2018 - GI referral placed; tubular & tubulovillous adenomas 09/12/18 colonoscopy, 3 year f/u rec   Pre-diabetes 06/04/2020    Past Surgical History:  Procedure Laterality  Date   CATARACT EXTRACTION, BILATERAL  2007    MEDICATIONS:  Cholecalciferol (VITAMIN D) 125 MCG (5000 UT) CAPS   Magnesium 500 MG TABS   No current facility-administered medications for this encounter.    Myra Gianotti, PA-C Surgical Short Stay/Anesthesiology [facility] Phone [phone] [facility] Phone [phone] 01/24/2021 5:06 PM

## 2021-01-27 ENCOUNTER — Encounter (HOSPITAL_COMMUNITY)
Admission: RE | Disposition: A | Discharge status: Home / Self Care | Payer: Self-pay | Source: Home / Self Care | Attending: Vascular Surgery

## 2021-01-27 ENCOUNTER — Inpatient Hospital Stay (HOSPITAL_COMMUNITY)
Admission: RE | Admit: 2021-01-27 | Discharge: 2021-01-28 | DRG: 269 | Disposition: A | Discharge status: Home / Self Care | Payer: Medicare HMO | Attending: Vascular Surgery | Admitting: Vascular Surgery

## 2021-01-27 ENCOUNTER — Inpatient Hospital Stay (HOSPITAL_COMMUNITY): Payer: Medicare HMO

## 2021-01-27 ENCOUNTER — Encounter (HOSPITAL_COMMUNITY): Payer: Self-pay | Admitting: Vascular Surgery

## 2021-01-27 ENCOUNTER — Inpatient Hospital Stay (HOSPITAL_COMMUNITY): Payer: Medicare HMO | Admitting: Anesthesiology

## 2021-01-27 ENCOUNTER — Other Ambulatory Visit: Payer: Self-pay

## 2021-01-27 ENCOUNTER — Inpatient Hospital Stay (HOSPITAL_COMMUNITY): Payer: Medicare HMO | Admitting: Vascular Surgery

## 2021-01-27 DIAGNOSIS — I714 Abdominal aortic aneurysm, without rupture, unspecified: Secondary | ICD-10-CM | POA: Diagnosis not present

## 2021-01-27 DIAGNOSIS — K219 Gastro-esophageal reflux disease without esophagitis: Secondary | ICD-10-CM | POA: Diagnosis present

## 2021-01-27 DIAGNOSIS — E1122 Type 2 diabetes mellitus with diabetic chronic kidney disease: Secondary | ICD-10-CM | POA: Diagnosis not present

## 2021-01-27 DIAGNOSIS — E663 Overweight: Secondary | ICD-10-CM | POA: Diagnosis present

## 2021-01-27 DIAGNOSIS — N182 Chronic kidney disease, stage 2 (mild): Secondary | ICD-10-CM | POA: Diagnosis not present

## 2021-01-27 DIAGNOSIS — Z9889 Other specified postprocedural states: Secondary | ICD-10-CM | POA: Diagnosis not present

## 2021-01-27 DIAGNOSIS — Z825 Family history of asthma and other chronic lower respiratory diseases: Secondary | ICD-10-CM | POA: Diagnosis not present

## 2021-01-27 DIAGNOSIS — H548 Legal blindness, as defined in USA: Secondary | ICD-10-CM | POA: Diagnosis not present

## 2021-01-27 DIAGNOSIS — E1151 Type 2 diabetes mellitus with diabetic peripheral angiopathy without gangrene: Secondary | ICD-10-CM | POA: Diagnosis present

## 2021-01-27 DIAGNOSIS — N400 Enlarged prostate without lower urinary tract symptoms: Secondary | ICD-10-CM | POA: Diagnosis not present

## 2021-01-27 DIAGNOSIS — I9789 Other postprocedural complications and disorders of the circulatory system, not elsewhere classified: Secondary | ICD-10-CM | POA: Diagnosis not present

## 2021-01-27 DIAGNOSIS — Z20822 Contact with and (suspected) exposure to covid-19: Secondary | ICD-10-CM | POA: Diagnosis not present

## 2021-01-27 DIAGNOSIS — Z833 Family history of diabetes mellitus: Secondary | ICD-10-CM | POA: Diagnosis not present

## 2021-01-27 DIAGNOSIS — E785 Hyperlipidemia, unspecified: Secondary | ICD-10-CM | POA: Diagnosis present

## 2021-01-27 DIAGNOSIS — I129 Hypertensive chronic kidney disease with stage 1 through stage 4 chronic kidney disease, or unspecified chronic kidney disease: Secondary | ICD-10-CM | POA: Diagnosis not present

## 2021-01-27 DIAGNOSIS — I7143 Infrarenal abdominal aortic aneurysm, without rupture: Secondary | ICD-10-CM | POA: Diagnosis not present

## 2021-01-27 DIAGNOSIS — Z8679 Personal history of other diseases of the circulatory system: Secondary | ICD-10-CM

## 2021-01-27 DIAGNOSIS — Z6827 Body mass index (BMI) 27.0-27.9, adult: Secondary | ICD-10-CM

## 2021-01-27 DIAGNOSIS — E1169 Type 2 diabetes mellitus with other specified complication: Secondary | ICD-10-CM | POA: Diagnosis present

## 2021-01-27 DIAGNOSIS — Y832 Surgical operation with anastomosis, bypass or graft as the cause of abnormal reaction of the patient, or of later complication, without mention of misadventure at the time of the procedure: Secondary | ICD-10-CM | POA: Diagnosis not present

## 2021-01-27 HISTORY — PX: ABDOMINAL AORTIC ENDOVASCULAR STENT GRAFT: SHX5707

## 2021-01-27 HISTORY — PX: ULTRASOUND GUIDANCE FOR VASCULAR ACCESS: SHX6516

## 2021-01-27 LAB — CBC
HCT: 35.3 % — ABNORMAL LOW (ref 39.0–52.0)
Hemoglobin: 11.2 g/dL — ABNORMAL LOW (ref 13.0–17.0)
MCH: 29.5 pg (ref 26.0–34.0)
MCHC: 31.7 g/dL (ref 30.0–36.0)
MCV: 92.9 fL (ref 80.0–100.0)
Platelets: 231 10*3/uL (ref 150–400)
RBC: 3.8 MIL/uL — ABNORMAL LOW (ref 4.22–5.81)
RDW: 13.8 % (ref 11.5–15.5)
WBC: 5.5 10*3/uL (ref 4.0–10.5)
nRBC: 0 % (ref 0.0–0.2)

## 2021-01-27 LAB — MAGNESIUM: Magnesium: 2.1 mg/dL (ref 1.7–2.4)

## 2021-01-27 LAB — BASIC METABOLIC PANEL
Anion gap: 7 (ref 5–15)
BUN: 14 mg/dL (ref 8–23)
CO2: 28 mmol/L (ref 22–32)
Calcium: 8.9 mg/dL (ref 8.9–10.3)
Chloride: 104 mmol/L (ref 98–111)
Creatinine, Ser: 1.14 mg/dL (ref 0.61–1.24)
GFR, Estimated: >60 mL/min (ref >60)
Glucose, Bld: 131 mg/dL — ABNORMAL HIGH (ref 70–99)
Potassium: 3.9 mmol/L (ref 3.5–5.1)
Sodium: 139 mmol/L (ref 135–145)

## 2021-01-27 LAB — APTT: aPTT: 120 seconds — ABNORMAL HIGH (ref 24–36)

## 2021-01-27 LAB — PROTIME-INR
INR: 1.2 (ref 0.8–1.2)
Prothrombin Time: 15.4 seconds — ABNORMAL HIGH (ref 11.4–15.2)

## 2021-01-27 LAB — ABO/RH: ABO/RH(D): A POS

## 2021-01-27 SURGERY — INSERTION, ENDOVASCULAR STENT GRAFT, AORTA, ABDOMINAL
Anesthesia: General | Site: Groin

## 2021-01-27 MED ORDER — GUAIFENESIN-DM 100-10 MG/5ML PO SYRP: 15.0000 mL | ORAL_SOLUTION | ORAL | Status: DC | PRN

## 2021-01-27 MED ORDER — CEFAZOLIN SODIUM-DEXTROSE 2-4 GM/100ML-% IV SOLN
2.0000 g | Freq: Three times a day (TID) | INTRAVENOUS | Status: AC
  Administered 2021-01-27 – 2021-01-28 (×2): 2 g via INTRAVENOUS
  Filled 2021-01-27 (×2): qty 100

## 2021-01-27 MED ORDER — HEPARIN 6000 UNIT IRRIGATION SOLUTION
Status: DC | PRN
  Administered 2021-01-27: 1

## 2021-01-27 MED ORDER — POTASSIUM CHLORIDE CRYS ER 20 MEQ PO TBCR: 20.0000 meq | EXTENDED_RELEASE_TABLET | Freq: Every day | ORAL | Status: DC | PRN

## 2021-01-27 MED ORDER — LABETALOL HCL 5 MG/ML IV SOLN
INTRAVENOUS | Status: AC
  Filled 2021-01-27: qty 4

## 2021-01-27 MED ORDER — PROMETHAZINE HCL 25 MG/ML IJ SOLN: 6.2500 mg | INTRAMUSCULAR | Status: DC | PRN

## 2021-01-27 MED ORDER — DIPHENHYDRAMINE HCL 50 MG/ML IJ SOLN
INTRAMUSCULAR | Status: AC
  Filled 2021-01-27: qty 1

## 2021-01-27 MED ORDER — SUGAMMADEX SODIUM 200 MG/2ML IV SOLN
INTRAVENOUS | Status: DC | PRN
  Administered 2021-01-27: 200 mg via INTRAVENOUS

## 2021-01-27 MED ORDER — PANTOPRAZOLE SODIUM 40 MG PO TBEC: 40.0000 mg | DELAYED_RELEASE_TABLET | Freq: Every day | ORAL | Status: DC

## 2021-01-27 MED ORDER — CHLORHEXIDINE GLUCONATE CLOTH 2 % EX PADS: 6.0000 | MEDICATED_PAD | Freq: Once | CUTANEOUS | Status: DC

## 2021-01-27 MED ORDER — EPHEDRINE 5 MG/ML INJ
INTRAVENOUS | Status: AC
  Filled 2021-01-27: qty 5

## 2021-01-27 MED ORDER — HEPARIN SODIUM (PORCINE) 1000 UNIT/ML IJ SOLN
INTRAMUSCULAR | Status: AC
  Filled 2021-01-27: qty 4

## 2021-01-27 MED ORDER — ORAL CARE MOUTH RINSE: 15.0000 mL | Freq: Once | OROMUCOSAL | Status: AC

## 2021-01-27 MED ORDER — FENTANYL CITRATE (PF) 250 MCG/5ML IJ SOLN
INTRAMUSCULAR | Status: AC
  Filled 2021-01-27: qty 5

## 2021-01-27 MED ORDER — HEPARIN SODIUM (PORCINE) 5000 UNIT/ML IJ SOLN: 5000.0000 [IU] | Freq: Three times a day (TID) | INTRAMUSCULAR | Status: DC

## 2021-01-27 MED ORDER — PROTAMINE SULFATE 10 MG/ML IV SOLN
INTRAVENOUS | Status: DC | PRN
  Administered 2021-01-27: 50 mg via INTRAVENOUS

## 2021-01-27 MED ORDER — DEXAMETHASONE SODIUM PHOSPHATE 10 MG/ML IJ SOLN
INTRAMUSCULAR | Status: AC
  Filled 2021-01-27: qty 1

## 2021-01-27 MED ORDER — HYDRALAZINE HCL 20 MG/ML IJ SOLN: 5.0000 mg | INTRAMUSCULAR | Status: DC | PRN

## 2021-01-27 MED ORDER — PHENYLEPHRINE 40 MCG/ML (10ML) SYRINGE FOR IV PUSH (FOR BLOOD PRESSURE SUPPORT)
PREFILLED_SYRINGE | INTRAVENOUS | Status: AC
  Filled 2021-01-27: qty 10

## 2021-01-27 MED ORDER — LIDOCAINE 2% (20 MG/ML) 5 ML SYRINGE
INTRAMUSCULAR | Status: DC | PRN
  Administered 2021-01-27: 100 mg via INTRAVENOUS

## 2021-01-27 MED ORDER — LACTATED RINGERS IV SOLN
INTRAVENOUS | Status: DC | PRN
Start: 2021-01-27 — End: 2021-01-27

## 2021-01-27 MED ORDER — PROPOFOL 10 MG/ML IV BOLUS
INTRAVENOUS | Status: AC
  Filled 2021-01-27: qty 20

## 2021-01-27 MED ORDER — HEPARIN SODIUM (PORCINE) 1000 UNIT/ML IJ SOLN
INTRAMUSCULAR | Status: DC | PRN
  Administered 2021-01-27: 10000 [IU] via INTRAVENOUS
  Administered 2021-01-27: 3000 [IU] via INTRAVENOUS
  Administered 2021-01-27 (×2): 2000 [IU] via INTRAVENOUS

## 2021-01-27 MED ORDER — BISACODYL 10 MG RE SUPP: 10.0000 mg | Freq: Every day | RECTAL | Status: DC | PRN

## 2021-01-27 MED ORDER — ACETAMINOPHEN 650 MG RE SUPP: 325.0000 mg | RECTAL | Status: DC | PRN

## 2021-01-27 MED ORDER — PROTAMINE SULFATE 10 MG/ML IV SOLN
INTRAVENOUS | Status: AC
  Filled 2021-01-27: qty 5

## 2021-01-27 MED ORDER — LACTATED RINGERS IV SOLN: INTRAVENOUS | Status: DC

## 2021-01-27 MED ORDER — AMISULPRIDE (ANTIEMETIC) 5 MG/2ML IV SOLN: 10.0000 mg | Freq: Once | INTRAVENOUS | Status: DC | PRN

## 2021-01-27 MED ORDER — LABETALOL HCL 5 MG/ML IV SOLN
INTRAVENOUS | Status: DC | PRN
  Administered 2021-01-27: 2.5 mg via INTRAVENOUS

## 2021-01-27 MED ORDER — IODIXANOL 320 MG/ML IV SOLN
INTRAVENOUS | Status: DC | PRN
  Administered 2021-01-27: 50.6 mL via INTRA_ARTERIAL

## 2021-01-27 MED ORDER — SODIUM CHLORIDE 0.9 % IV SOLN: INTRAVENOUS | Status: DC

## 2021-01-27 MED ORDER — ALUM & MAG HYDROXIDE-SIMETH 200-200-20 MG/5ML PO SUSP: 15.0000 mL | ORAL | Status: DC | PRN

## 2021-01-27 MED ORDER — ONDANSETRON HCL 4 MG/2ML IJ SOLN
INTRAMUSCULAR | Status: AC
  Filled 2021-01-27: qty 2

## 2021-01-27 MED ORDER — ATORVASTATIN CALCIUM 10 MG PO TABS
10.0000 mg | ORAL_TABLET | Freq: Every day | ORAL | Status: DC
  Administered 2021-01-27: 10 mg via ORAL
  Filled 2021-01-27: qty 1

## 2021-01-27 MED ORDER — ESMOLOL HCL 100 MG/10ML IV SOLN
INTRAVENOUS | Status: AC
  Filled 2021-01-27: qty 20

## 2021-01-27 MED ORDER — GLYCOPYRROLATE PF 0.2 MG/ML IJ SOSY
PREFILLED_SYRINGE | INTRAMUSCULAR | Status: AC
  Filled 2021-01-27: qty 1

## 2021-01-27 MED ORDER — CHLORHEXIDINE GLUCONATE 0.12 % MT SOLN
OROMUCOSAL | Status: AC
  Administered 2021-01-27: 15 mL via OROMUCOSAL
  Filled 2021-01-27: qty 15

## 2021-01-27 MED ORDER — PHENOL 1.4 % MT LIQD: 1.0000 | OROMUCOSAL | Status: DC | PRN

## 2021-01-27 MED ORDER — ARTIFICIAL TEARS OPHTHALMIC OINT
TOPICAL_OINTMENT | OPHTHALMIC | Status: DC | PRN
Start: 2021-01-27 — End: 2021-01-27
  Administered 2021-01-27: 1 via OPHTHALMIC

## 2021-01-27 MED ORDER — ESMOLOL HCL 100 MG/10ML IV SOLN
INTRAVENOUS | Status: DC | PRN
  Administered 2021-01-27: 50 mg via INTRAVENOUS
  Administered 2021-01-27: 10 mg via INTRAVENOUS
  Administered 2021-01-27: 20 mg via INTRAVENOUS
  Administered 2021-01-27 (×3): 10 mg via INTRAVENOUS
  Administered 2021-01-27: 30 mg via INTRAVENOUS
  Administered 2021-01-27: 50 mg via INTRAVENOUS
  Administered 2021-01-27: 10 mg via INTRAVENOUS

## 2021-01-27 MED ORDER — CHLORHEXIDINE GLUCONATE 0.12 % MT SOLN: 15.0000 mL | Freq: Once | OROMUCOSAL | Status: AC

## 2021-01-27 MED ORDER — POLYETHYLENE GLYCOL 3350 17 G PO PACK: 17.0000 g | PACK | Freq: Every day | ORAL | Status: DC | PRN

## 2021-01-27 MED ORDER — PROPOFOL 10 MG/ML IV BOLUS
INTRAVENOUS | Status: DC | PRN
  Administered 2021-01-27: 125 mg via INTRAVENOUS

## 2021-01-27 MED ORDER — SODIUM CHLORIDE 0.9 % IV SOLN
INTRAVENOUS | Status: DC
  Administered 2021-01-27: 1000 mL via INTRAVENOUS

## 2021-01-27 MED ORDER — ROCURONIUM BROMIDE 10 MG/ML (PF) SYRINGE
PREFILLED_SYRINGE | INTRAVENOUS | Status: AC
  Filled 2021-01-27: qty 10

## 2021-01-27 MED ORDER — ACETAMINOPHEN 500 MG PO TABS
1000.0000 mg | ORAL_TABLET | Freq: Once | ORAL | Status: AC
  Administered 2021-01-27: 1000 mg via ORAL
  Filled 2021-01-27: qty 2

## 2021-01-27 MED ORDER — OXYCODONE-ACETAMINOPHEN 5-325 MG PO TABS: 1.0000 | ORAL_TABLET | ORAL | Status: DC | PRN

## 2021-01-27 MED ORDER — ONDANSETRON HCL 4 MG/2ML IJ SOLN
INTRAMUSCULAR | Status: DC | PRN
  Administered 2021-01-27: 4 mg via INTRAVENOUS

## 2021-01-27 MED ORDER — 0.9 % SODIUM CHLORIDE (POUR BTL) OPTIME
TOPICAL | Status: DC | PRN
  Administered 2021-01-27: 1000 mL

## 2021-01-27 MED ORDER — DEXAMETHASONE SODIUM PHOSPHATE 10 MG/ML IJ SOLN
INTRAMUSCULAR | Status: DC | PRN
  Administered 2021-01-27: 10 mg via INTRAVENOUS

## 2021-01-27 MED ORDER — ROCURONIUM BROMIDE 10 MG/ML (PF) SYRINGE
PREFILLED_SYRINGE | INTRAVENOUS | Status: DC | PRN
  Administered 2021-01-27: 100 mg via INTRAVENOUS

## 2021-01-27 MED ORDER — PHENYLEPHRINE 40 MCG/ML (10ML) SYRINGE FOR IV PUSH (FOR BLOOD PRESSURE SUPPORT)
PREFILLED_SYRINGE | INTRAVENOUS | Status: DC | PRN
  Administered 2021-01-27 (×2): 80 ug via INTRAVENOUS

## 2021-01-27 MED ORDER — SODIUM CHLORIDE 0.9 % IV SOLN: 500.0000 mL | Freq: Once | INTRAVENOUS | Status: DC | PRN

## 2021-01-27 MED ORDER — HEPARIN 6000 UNIT IRRIGATION SOLUTION
Status: AC
  Filled 2021-01-27: qty 500

## 2021-01-27 MED ORDER — EPHEDRINE SULFATE-NACL 50-0.9 MG/10ML-% IV SOSY
PREFILLED_SYRINGE | INTRAVENOUS | Status: DC | PRN
Start: 2021-01-27 — End: 2021-01-27
  Administered 2021-01-27: 5 mg via INTRAVENOUS

## 2021-01-27 MED ORDER — LIDOCAINE 2% (20 MG/ML) 5 ML SYRINGE
INTRAMUSCULAR | Status: AC
  Filled 2021-01-27: qty 5

## 2021-01-27 MED ORDER — LABETALOL HCL 5 MG/ML IV SOLN: 10.0000 mg | INTRAVENOUS | Status: DC | PRN

## 2021-01-27 MED ORDER — PHENYLEPHRINE HCL-NACL 20-0.9 MG/250ML-% IV SOLN
INTRAVENOUS | Status: DC | PRN
  Administered 2021-01-27: 15 ug/min via INTRAVENOUS

## 2021-01-27 MED ORDER — ONDANSETRON HCL 4 MG/2ML IJ SOLN: 4.0000 mg | Freq: Four times a day (QID) | INTRAMUSCULAR | Status: DC | PRN

## 2021-01-27 MED ORDER — ARTIFICIAL TEARS OPHTHALMIC OINT
TOPICAL_OINTMENT | OPHTHALMIC | Status: AC
  Filled 2021-01-27: qty 3.5

## 2021-01-27 MED ORDER — FENTANYL CITRATE (PF) 250 MCG/5ML IJ SOLN
INTRAMUSCULAR | Status: DC | PRN
  Administered 2021-01-27 (×2): 50 ug via INTRAVENOUS
  Administered 2021-01-27: 25 ug via INTRAVENOUS
  Administered 2021-01-27: 50 ug via INTRAVENOUS

## 2021-01-27 MED ORDER — DOCUSATE SODIUM 100 MG PO CAPS: 100.0000 mg | ORAL_CAPSULE | Freq: Every day | ORAL | Status: DC

## 2021-01-27 MED ORDER — MAGNESIUM SULFATE 2 GM/50ML IV SOLN: 2.0000 g | Freq: Every day | INTRAVENOUS | Status: DC | PRN

## 2021-01-27 MED ORDER — METOPROLOL TARTRATE 5 MG/5ML IV SOLN: 2.0000 mg | INTRAVENOUS | Status: DC | PRN

## 2021-01-27 MED ORDER — ASPIRIN EC 81 MG PO TBEC
81.0000 mg | DELAYED_RELEASE_TABLET | Freq: Every day | ORAL | Status: DC
  Administered 2021-01-28: 81 mg via ORAL
  Filled 2021-01-27: qty 1

## 2021-01-27 MED ORDER — ACETAMINOPHEN 325 MG PO TABS: 325.0000 mg | ORAL_TABLET | ORAL | Status: DC | PRN

## 2021-01-27 MED ORDER — CEFAZOLIN SODIUM-DEXTROSE 2-4 GM/100ML-% IV SOLN
2.0000 g | INTRAVENOUS | Status: AC
  Administered 2021-01-27: 2 g via INTRAVENOUS
  Filled 2021-01-27: qty 100

## 2021-01-27 MED ORDER — FENTANYL CITRATE (PF) 100 MCG/2ML IJ SOLN: 25.0000 ug | INTRAMUSCULAR | Status: DC | PRN

## 2021-01-27 MED ORDER — HYDROMORPHONE HCL 1 MG/ML IJ SOLN: 0.5000 mg | INTRAMUSCULAR | Status: DC | PRN

## 2021-01-27 SURGICAL SUPPLY — 53 items
ANCHOR CATH FOLEY SECURE (MISCELLANEOUS) ×3 IMPLANT
BAG COUNTER SPONGE SURGICOUNT (BAG) ×3 IMPLANT
BLADE CLIPPER SURG (BLADE) ×3 IMPLANT
CANISTER SUCT 3000ML PPV (MISCELLANEOUS) ×3 IMPLANT
CATH BEACON 5.038 65CM KMP-01 (CATHETERS) ×3 IMPLANT
CATH OMNI FLUSH .035X70CM (CATHETERS) ×3 IMPLANT
CHLORAPREP W/TINT 26 (MISCELLANEOUS) ×3 IMPLANT
DERMABOND ADHESIVE PROPEN (GAUZE/BANDAGES/DRESSINGS) ×2
DERMABOND ADVANCED (GAUZE/BANDAGES/DRESSINGS) ×1
DERMABOND ADVANCED .7 DNX12 (GAUZE/BANDAGES/DRESSINGS) ×2 IMPLANT
DERMABOND ADVANCED .7 DNX6 (GAUZE/BANDAGES/DRESSINGS) ×4 IMPLANT
DEVICE CLOSURE PERCLS PRGLD 6F (VASCULAR PRODUCTS) IMPLANT
DEVICE TORQUE KENDALL .025-038 (MISCELLANEOUS) IMPLANT
DRSG TEGADERM 2-3/8X2-3/4 SM (GAUZE/BANDAGES/DRESSINGS) ×6 IMPLANT
ELECT REM PT RETURN 9FT ADLT (ELECTROSURGICAL) ×6
ELECTRODE REM PT RTRN 9FT ADLT (ELECTROSURGICAL) ×4 IMPLANT
EXCLDR TRNK ENDO 23X14.5X12 15 (Endovascular Graft) ×3 IMPLANT
EXCLUDER TNK END 23X14.5X12 15 (Endovascular Graft) ×2 IMPLANT
EXTENDER ENDOPROSTHESIS 14X7 (Endovascular Graft) ×3 IMPLANT
GAUZE SPONGE 2X2 8PLY STRL LF (GAUZE/BANDAGES/DRESSINGS) ×4 IMPLANT
GLOVE SURG POLYISO LF SZ8 (GLOVE) ×3 IMPLANT
GOWN STRL REUS W/ TWL LRG LVL3 (GOWN DISPOSABLE) ×4 IMPLANT
GOWN STRL REUS W/ TWL XL LVL3 (GOWN DISPOSABLE) ×2 IMPLANT
GOWN STRL REUS W/TWL LRG LVL3 (GOWN DISPOSABLE) ×2
GOWN STRL REUS W/TWL XL LVL3 (GOWN DISPOSABLE) ×1
GRAFT BALLN CATH 65CM (STENTS) ×2 IMPLANT
GUIDEWIRE ANGLED .035X150CM (WIRE) IMPLANT
KIT BASIN OR (CUSTOM PROCEDURE TRAY) ×3 IMPLANT
KIT DRAIN CSF ACCUDRAIN (MISCELLANEOUS) IMPLANT
KIT TURNOVER KIT B (KITS) ×3 IMPLANT
LEG CONTRALATERAL 16X16X9.5 (Endovascular Graft) ×3 IMPLANT
NS IRRIG 1000ML POUR BTL (IV SOLUTION) ×3 IMPLANT
PACK ENDOVASCULAR (PACKS) ×3 IMPLANT
PAD ARMBOARD 7.5X6 YLW CONV (MISCELLANEOUS) ×6 IMPLANT
PERCLOSE PROGLIDE 6F (VASCULAR PRODUCTS)
SET MICROPUNCTURE 5F STIFF (MISCELLANEOUS) ×3 IMPLANT
SHEATH BRITE TIP 8FR 23CM (SHEATH) ×3 IMPLANT
SHEATH PINNACLE 8F 10CM (SHEATH) ×3 IMPLANT
SPONGE GAUZE 2X2 STER 10/PKG (GAUZE/BANDAGES/DRESSINGS) ×2
STENT GRAFT BALLN CATH 65CM (STENTS) ×1
STOPCOCK MORSE 400PSI 3WAY (MISCELLANEOUS) ×3 IMPLANT
SUT MNCRL AB 4-0 PS2 18 (SUTURE) ×6 IMPLANT
SUT PROLENE 5 0 C 1 24 (SUTURE) IMPLANT
SUT VIC AB 2-0 CT1 27 (SUTURE)
SUT VIC AB 2-0 CT1 TAPERPNT 27 (SUTURE) IMPLANT
SUT VIC AB 3-0 SH 27 (SUTURE)
SUT VIC AB 3-0 SH 27X BRD (SUTURE) IMPLANT
SYR 20CC LL (SYRINGE) ×6 IMPLANT
TOWEL GREEN STERILE (TOWEL DISPOSABLE) ×3 IMPLANT
TRAY FOLEY MTR SLVR 16FR STAT (SET/KITS/TRAYS/PACK) IMPLANT
TUBING INJECTOR 48 (MISCELLANEOUS) ×3 IMPLANT
WIRE AMPLATZ SS-J .035X180CM (WIRE) ×6 IMPLANT
WIRE BENTSON .035X145CM (WIRE) ×6 IMPLANT

## 2021-01-27 NOTE — Discharge Instructions (Addendum)
[facility]   Discharge Instructions  Endovascular Aortic Aneurysm Repair  Please refer to the following instructions for your post-procedure care. Your surgeon or Physician Assistant will discuss any changes with you.  Activity  You are encouraged to walk as much as you can. You can slowly return to normal activities but must avoid strenuous activity and heavy lifting until your doctor tells you it's OK. Avoid activities such as vacuuming or swinging a gold club. It is normal to feel tired for several weeks after your surgery. Do not drive until your doctor gives the OK and you are no longer taking prescription pain medications. It is also normal to have difficulty with sleep habits, eating, and bowel movements after surgery. These will go away with time.  Bathing/Showering  Shower daily after you go home.  Do not soak in a bathtub, hot tub, or swim until the incision heals completely.  If you have incisions in your groin, wash the groin wounds with soap and water daily and pat dry. (No tub bath-only shower)  Then put a dry gauze or washcloth there to keep this area dry to help prevent wound infection daily and as needed.  Do not use Vaseline or neosporin on your incisions.  Only use soap and water on your incisions and then protect and keep dry.  Incision Care  Shower every day. Clean your incision with mild soap and water. Pat the area dry with a clean towel. You do not need a bandage unless otherwise instructed. Do not apply any ointments or creams to your incision. If you clothing is irritating, you may cover your incision with a dry gauze pad.  Diet  Resume your normal diet. There are no special food restrictions following this procedure. A low fat/low cholesterol diet is recommended for all patients with vascular disease. In order to heal from your surgery, it is CRITICAL to get adequate nutrition. Your body requires vitamins, minerals, and protein.  Vegetables are the best source of vitamins and minerals. Vegetables also provide the perfect balance of protein. Processed food has little nutritional value, so try to avoid this.  Medications  Resume taking all of your medications unless your doctor or nurse practitioner tells you not to. If your incision is causing pain, you may take over-the-counter pain relievers such as acetaminophen (Tylenol). If you were prescribed a stronger pain medication, please be aware these medications can cause nausea and constipation. Prevent nausea by taking the medication with a snack or meal. Avoid constipation by drinking plenty of fluids and eating foods with a high amount of fiber, such as fruits, vegetables, and grains.  Do not take Tylenol if you are taking prescription pain medications.   Follow up  [place] office will schedule a follow-up appointment with a CT scan 3-4 weeks after your surgery.  Please call us immediately for any of the following conditions  Severe or worsening pain in your legs or feet or in your abdomen back or chest. Increased pain, redness, drainage (pus) from your incision site. Increased abdominal pain, bloating, nausea, vomiting or persistent diarrhea. Fever of 101 degrees or higher. Swelling in your leg (s),  Reduce your risk of vascular disease  Stop smoking. If you would like help call [facility] at 1-800-QUIT-NOW 779-103-3337) or [place] at [phone]. Manage your cholesterol Maintain a desired weight Control your diabetes Keep your blood pressure down  If you have questions, please call the office at [phone].  Patient may return to work  on 02/07/2021.

## 2021-01-27 NOTE — Anesthesia Procedure Notes (Signed)
Procedure Name: Intubation Date/Time: 01/27/2021 10:27 AM Performed by: Jearld Pies, CRNA Pre-anesthesia Checklist: Patient identified, Emergency Drugs available, Suction available and Patient being monitored Patient Re-evaluated:Patient Re-evaluated prior to induction Oxygen Delivery Method: Circle System Utilized Preoxygenation: Pre-oxygenation with 100% oxygen Induction Type: IV induction Ventilation: Mask ventilation without difficulty and Oral airway inserted - appropriate to patient size Laryngoscope Size: Sabra Heck and 3 Grade View: Grade I Tube type: Oral Tube size: 7.5 mm Number of attempts: 1 Airway Equipment and Method: Stylet and Oral airway Placement Confirmation: ETT inserted through vocal cords under direct vision, positive ETCO2 and breath sounds checked- equal and bilateral Secured at: 22 cm Tube secured with: Tape Dental Injury: Teeth and Oropharynx as per pre-operative assessment

## 2021-01-27 NOTE — Anesthesia Procedure Notes (Signed)
Arterial Line Insertion Start/End11/05/2020 9:40 AM, 01/27/2021 9:50 AM Performed by: Imagene Riches, CRNA, CRNA  Patient location: Pre-op. Preanesthetic checklist: patient identified, IV checked, site marked, risks and benefits discussed, surgical consent, monitors and equipment checked, pre-op evaluation and timeout performed Left, radial was placed Catheter size: 20 G Hand hygiene performed  and maximum sterile barriers used  Allen's test indicative of satisfactory collateral circulation Attempts: 1 Procedure performed without using ultrasound guided technique. Following insertion, dressing applied and Biopatch. Post procedure assessment: normal  Patient tolerated the procedure well with no immediate complications.

## 2021-01-27 NOTE — Anesthesia Postprocedure Evaluation (Signed)
Anesthesia Post Note  Patient: STARLING CHRISTOFFERSON  Procedure(s) Performed: ABDOMINAL AORTIC ENDOVASCULAR STENT GRAFT REPAIR ULTRASOUND GUIDANCE FOR VASCULAR ACCESS, BILATERAL FEMORAL ARTERIES (Bilateral: Groin)     Patient location during evaluation: PACU Anesthesia Type: General Level of consciousness: sedated Pain management: pain level controlled Vital Signs Assessment: post-procedure vital signs reviewed and stable Respiratory status: spontaneous breathing and respiratory function stable Cardiovascular status: stable Postop Assessment: no apparent nausea or vomiting Anesthetic complications: no   No notable events documented.  Last Vitals:  Vitals:   01/27/21 1315 01/27/21 1330  BP: (!) 148/89 (!) 150/88  Pulse: 73 74  Resp: 14 16  Temp:  36.6 C  SpO2: 100% 100%    Last Pain:  Vitals:   01/27/21 1315  TempSrc:   PainSc: 0-No pain                 Ilianna Bown DANIEL

## 2021-01-27 NOTE — Transfer of Care (Signed)
Immediate Anesthesia Transfer of Care Note  Patient: Bryan Morgan  Procedure(s) Performed: ABDOMINAL AORTIC ENDOVASCULAR STENT GRAFT REPAIR ULTRASOUND GUIDANCE FOR VASCULAR ACCESS, BILATERAL FEMORAL ARTERIES (Bilateral: Groin)  Patient Location: PACU  Anesthesia Type:General  Level of Consciousness: awake, alert  and oriented  Airway & Oxygen Therapy: Patient Spontanous Breathing and Patient connected to nasal cannula oxygen  Post-op Assessment: Report given to RN and Post -op Vital signs reviewed and stable  Post vital signs: Reviewed and stable  Last Vitals:  Vitals Value Taken Time  BP 147/66 (ABP) 01/27/21 1209  Temp    Pulse 66 01/27/21 1215  Resp 9 01/27/21 1215  SpO2 100 % 01/27/21 1215  Vitals shown include unvalidated device data.  Last Pain:  Vitals:   01/27/21 0837  TempSrc:   PainSc: 0-No pain         Complications: No notable events documented.

## 2021-01-27 NOTE — Op Note (Signed)
DATE OF SERVICE: 01/27/2021  PATIENT:  Bryan Morgan  80 y.o. male  PRE-OPERATIVE DIAGNOSIS:  large infrarenal abdominal aortic aneurysm  POST-OPERATIVE DIAGNOSIS:  Same  PROCEDURE:   1) bilateral common femoral artery ultrasound guided access and large bore closure (CPT [phone]) 2) endovascular aortic aneurysm repair (CPT [phone])  SURGEON:  Surgeon(s) and Role:    * Cherre Robins, MD - Primary    Marty Heck, MD - Assisting  ASSISTANT: Monica Martinez, MD  An assistant was required to facilitate exposure and expedite the case.  ANESTHESIA:   general  EBL: 58mL  BLOOD ADMINISTERED:none  DRAINS: none   LOCAL MEDICATIONS USED:  NONE  SPECIMEN:  none  COUNTS: confirmed correct.  TOURNIQUET:  none  PATIENT DISPOSITION:  PACU - hemodynamically stable.   Delay start of Pharmacological VTE agent (>24hrs) due to surgical blood loss or risk of bleeding: no  INDICATION FOR PROCEDURE: Bryan Morgan is a 80 y.o. male with large infrarenal abdominal aortic aneurysm demonstrated on plain film of the abdomen during urologic work-up for urinary retention.  Preoperative CT angiogram demonstrated adequate anatomy for endovascular repair. After careful discussion of risks, benefits, and alternatives the patient was offered endovascular repair of aneurysm. The patient understood and wished to proceed.  OPERATIVE FINDINGS: Unremarkable EVAR.  Type II endoleak noted at completion.  No evidence of type I or type III endoleak.  DESCRIPTION OF PROCEDURE: After identification of the patient in the pre-operative holding area, the patient was transferred to the operating room. The patient was positioned supine on the operating room table. Anesthesia was induced. The abdomen, groins, and thighs were prepped and draped in standard fashion. A surgical pause was performed confirming correct patient, procedure, and operative location.  Using ultrasound guidance, the right common  femoral artery was accessed with micropuncture technique.  A Bentson wire was fed through the micropuncture sheath.  The micropuncture sheath was exchanged for a 6 [place] dilator.  A small incision was made about the dilator and dilated with a hemostat.  2 Perclose devices were placed at 10:00 and 2:00 for "preclose" technique.  An 8 French sheath was introduced into the artery and attention turned to the contralateral groin.  Using ultrasound guidance, the left common femoral artery was accessed with micropuncture technique.  A Bentson wire was fed through the micropuncture sheath.  The micropuncture sheath was exchanged for a 6 [place] dilator.  A small incision was made about the dilator and dilated with a hemostat.  2 Perclose devices were placed at 10:00 and 2:00 for "preclose" technique.  An 8 French sheath was introduced into the artery.  Patient was systemically heparinized.  Activated clotting time measurements were used throughout the case to guide anticoagulation dosing.  On the left-hand side, we exchanged wires to deliver a Lunderquist wire into the descending thoracic aorta.  A 16 French dry seal sheath was introduced into the aortic bifurcation over the wire.  A 23 x 14 x 120 millimeter Gore conformable excluder endograft was delivered through the left side axis to the level of L2.  On the right-hand side, we exchanged wires deliver a Lunderquist wire into the descending thoracic aorta.  A 12 French dry seal sheath was introduced into the aortic bifurcation.  An Omni Flush catheter was advanced over the wire to just above the level of L2.  An aortogram was performed to confirm our renal anatomy.  The endograft was partially deployed at the level of the takeoff of  the left (lower) renal artery.  A Lunderquist wire was advanced to the Omni Flush catheter from the right-hand side and left in position in the descending thoracic aorta.  Using "buddy wire" technique a Bentson wire was introduced  through the right sided access.  A Kumpe catheter was used to help select the contralateral gate.  The contralateral gate was a bit compressed by the aneurysm sac.  We reconstructed the endograft and turned it to allow the contralateral gate to open widely.  The gate was easily cannulated after this maneuver.  Over the Bentson wire a Omni Flush catheter was navigated into the main body of the endograft.  The catheter was reformed and spun in the main body of the endograft and found to spin freely, confirming successful cannulation.  The catheter was advanced above the level of the main body and a aortogram performed to confirm good seal proximally.  The aortogram revealed the stent graft to be in good position.  We withdrew the Omni Flush partially over a stiff wire.   A retrograde right pelvic angiogram was performed through the sheath to define the iliac bifurcation.  A 16 x 63mm iliac extension limb was delivered over the wire and deployed without difficulty to the level of the common iliac bifurcation.  We completed the deployment of the endograft on the left-hand side.  A retrograde pelvic angiogram was performed through the sheath on the left-hand side.  A 14 x 70 mm iliac extension limb was then delivered over the wire and deployed without difficulty to the level of the common iliac bifurcation.  A mob balloon was delivered over the wire and used to perform angioplasty the over lap zones and points of seal from the right hand at access.  This was repeated via the left sided access.  An Omni Flush catheter was advanced above the main body of the endograft in the native aorta.  Completion aortogram was performed.  There was no evidence of type I or type III endoleak.  There was evidence of type II endoleak.  Heparin was reversed with protamine.  Manual pressure was held on the left groin above and below her access site.  Previously placed Perclose sutures were cinched down over our wire access.   This rendered the arteriotomy hemostatic.  Satisfied we removed the wire and recinched the Perclose sutures.  Pressure was held on the groin.  Manual pressure was held on the right groin above and below her access site.  Previously placed Perclose sutures were cinched down over our wire access.  This rendered the arteriotomy hemostatic.  Satisfied we removed the wire and recinched the Perclose sutures.  Pressure was held on the groin.  The groin incisions were closed with U-stitches of 4-O monocryl. Dermabond was applied. Doppler flow was confirmed in the feet prior to extubation.  Upon completion of the case instrument and sharps counts were confirmed correct. The patient was transferred to the PACU in good condition. I was present for all portions of the procedure.  Yevonne Aline. Stanford Breed, MD [facility] Phone Number: [phone] 01/27/2021 11:55 AM

## 2021-01-27 NOTE — Interval H&P Note (Signed)
History and Physical Interval Note:  01/27/2021 10:03 AM  Bryan Morgan  has presented today for surgery, with the diagnosis of AAA.  The various methods of treatment have been discussed with the patient and family. After consideration of risks, benefits and other options for treatment, the patient has consented to  Procedure(s): ABDOMINAL AORTIC ENDOVASCULAR STENT GRAFT REPAIR (N/A) as a surgical intervention.  The patient's history has been reviewed, patient examined, no change in status, stable for surgery.  I have reviewed the patient's chart and labs.  Questions were answered to the patient's satisfaction.     Cherre Robins

## 2021-01-27 NOTE — Progress Notes (Signed)
  Day of Surgery Note    Subjective:  resting comfortably.  No complaints   Vitals:   01/27/21 0809 01/27/21 1215  BP: (!) 154/89 129/79  Pulse: 60 (!) 107  Resp: 17 18  Temp: 98 F (36.7 C)   SpO2: 98% 92%    Incisions:   bilateral groins are soft without hematoma Extremities:  easily palpable DP pulses bilaterally Cardiac:  regular Lungs:  non labored Abdomen:  soft, NT   Assessment/Plan:  This is a 80 y.o. male who is s/p  EVAR  -pt doing well in recovery -to [place] later today -anticipate dc home tomorrow if uneventful post op course.    Leontine Locket, PA-C 01/27/2021 12:35 PM [phone]

## 2021-01-28 ENCOUNTER — Encounter (HOSPITAL_COMMUNITY): Payer: Self-pay | Admitting: Vascular Surgery

## 2021-01-28 DIAGNOSIS — Z95828 Presence of other vascular implants and grafts: Secondary | ICD-10-CM

## 2021-01-28 LAB — BASIC METABOLIC PANEL
Anion gap: 8 (ref 5–15)
BUN: 15 mg/dL (ref 8–23)
CO2: 25 mmol/L (ref 22–32)
Calcium: 9 mg/dL (ref 8.9–10.3)
Chloride: 103 mmol/L (ref 98–111)
Creatinine, Ser: 1.24 mg/dL (ref 0.61–1.24)
GFR, Estimated: 59 mL/min — ABNORMAL LOW (ref >60)
Glucose, Bld: 117 mg/dL — ABNORMAL HIGH (ref 70–99)
Potassium: 4.2 mmol/L (ref 3.5–5.1)
Sodium: 136 mmol/L (ref 135–145)

## 2021-01-28 LAB — CBC
HCT: 35.4 % — ABNORMAL LOW (ref 39.0–52.0)
Hemoglobin: 11.8 g/dL — ABNORMAL LOW (ref 13.0–17.0)
MCH: 29.9 pg (ref 26.0–34.0)
MCHC: 33.3 g/dL (ref 30.0–36.0)
MCV: 89.6 fL (ref 80.0–100.0)
Platelets: 251 10*3/uL (ref 150–400)
RBC: 3.95 MIL/uL — ABNORMAL LOW (ref 4.22–5.81)
RDW: 13.3 % (ref 11.5–15.5)
WBC: 9.2 10*3/uL (ref 4.0–10.5)
nRBC: 0 % (ref 0.0–0.2)

## 2021-01-28 LAB — POCT ACTIVATED CLOTTING TIME
Activated Clotting Time: 202 seconds
Activated Clotting Time: 225 seconds
Activated Clotting Time: 237 seconds

## 2021-01-28 MED ORDER — OXYCODONE-ACETAMINOPHEN 5-325 MG PO TABS: 1.0000 | ORAL_TABLET | Freq: Four times a day (QID) | ORAL | 0 refills | Status: DC | PRN

## 2021-01-28 MED ORDER — ATORVASTATIN CALCIUM 10 MG PO TABS: 10.0000 mg | ORAL_TABLET | Freq: Every day | ORAL | 11 refills | Status: DC

## 2021-01-28 MED ORDER — CHLORHEXIDINE GLUCONATE CLOTH 2 % EX PADS: 6.0000 | MEDICATED_PAD | Freq: Every day | CUTANEOUS | Status: DC

## 2021-01-28 MED ORDER — ASPIRIN EC 81 MG PO TBEC: 81.0000 mg | DELAYED_RELEASE_TABLET | Freq: Every day | ORAL | Status: DC

## 2021-01-28 NOTE — Plan of Care (Signed)
  Problem: Education: Goal: Knowledge of General Education information will improve Description: Including pain rating scale, medication(s)/side effects and non-pharmacologic comfort measures Outcome: Adequate for Discharge   

## 2021-01-28 NOTE — Progress Notes (Signed)
Discharge instructions (including medications) discussed with and copy provided to patient/caregiver 

## 2021-01-28 NOTE — Progress Notes (Signed)
Mobility Specialist: Progress Note   01/28/21 1021  Mobility  Activity Refused mobility   Pt refused mobility stating he just walked in the hallway with NT.   [facility] [place] Mobility Specialist Mobility Specialist Phone: [phone]

## 2021-01-28 NOTE — Discharge Summary (Signed)
EVAR Discharge Summary   Bryan Morgan [birth year] 80 y.o. male  MRN: [id number]  Admission Date: 01/27/2021  Discharge Date: 01/28/2021  Physician: Cherre Robins, MD  Admission Diagnosis: AAA (abdominal aortic aneurysm) [I71.40]   HPI:   This is a 80 y.o. male referred to clinic for evaluation of large, asymptomatic infrarenal abdominal aortic aneurysm demonstrated in recent ER work-up for urinary retention.  The patient had greater than 2 L in his bladder when he presented to the ER 11/29/2020.  This was relieved by Foley decompression.  Foley remains in place.  AAA was discovered incidentally on noncontrast CT scan looking for nephrolithiasis.  The patient is asymptomatic from a aneurysm perspective.  He is a very healthy gentleman who is still working in [employer] to "stay busy."  Hospital Course:  The patient was admitted to the hospital and taken to the operating room on 01/27/2021 and underwent: 1) bilateral common femoral artery ultrasound guided access and large bore closure (CPT [phone]) 2) endovascular aortic aneurysm repair (CPT [phone])    Findings: Unremarkable EVAR.  Type II endoleak noted at completion.  No evidence of type I or type III endoleak.  The pt tolerated the procedure well and was transported to the PACU in good condition.   By POD 1, pt was doing well with palpable DP pulses bilaterally.  He has a chronic foley catheter and this was left in place. He was able to ambulate without difficulty as well as have increase of intake of solids without difficulty.     CBC    Component Value Date/Time   WBC 9.2 01/28/2021 0205   RBC 3.95 (L) 01/28/2021 0205   HGB 11.8 (L) 01/28/2021 0205   HCT 35.4 (L) 01/28/2021 0205   PLT 251 01/28/2021 0205   MCV 89.6 01/28/2021 0205   MCH 29.9 01/28/2021 0205   MCHC 33.3 01/28/2021 0205   RDW 13.3 01/28/2021 0205   LYMPHSABS 1,526 10/01/2020 0956   EOSABS 72 10/01/2020 0956   BASOSABS 18 10/01/2020  0956    BMET    Component Value Date/Time   NA 136 01/28/2021 0205   K 4.2 01/28/2021 0205   CL 103 01/28/2021 0205   CO2 25 01/28/2021 0205   GLUCOSE 117 (H) 01/28/2021 0205   BUN 15 01/28/2021 0205   CREATININE 1.24 01/28/2021 0205   CREATININE 1.27 (H) 10/01/2020 0956   CALCIUM 9.0 01/28/2021 0205   GFRNONAA 59 (L) 01/28/2021 0205   GFRNONAA 53 (L) 10/01/2020 0956   GFRAA 61 10/01/2020 0956       Discharge Instructions     Discharge patient   Complete by: As directed    Discharge home after pt has walked in the hallways and he has been seen by Dr. Stanford Breed.  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   Abnormal TSH 10/02/2020   Mild hypertension 12/26/2019   Medication therapy management recommendation refused by patient 05/16/2019   History of adenomatous polyp of colon 11/04/2018   CKD stage 2 due to type 2 diabetes mellitus ([place]) 08/12/2018   Hyperlipidemia due to type 2 diabetes mellitus ([place]) 08/06/2018   Diet-controlled diabetes mellitus ([place]) 08/06/2018   Vitamin D deficiency 08/06/2018   Benign prostatic hyperplasia  without lower urinary tract symptoms 08/05/2018   Elevated PSA 08/05/2018   Gastroesophageal reflux disease without esophagitis 08/05/2018   Overweight (BMI 25.0-29.9) 07/31/2018   Past Medical History:  Diagnosis Date   AAA (abdominal aortic aneurysm)    BPH (benign prostatic hyperplasia)    with retention, s/p foley catheter placement in [place] ED 12/29/20   History of needle biopsy of prostate with negative result    Legal blindness    Positive colorectal cancer screening using Cologuard test 08/20/2018   08/18/2018 - 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   atorvastatin 10 MG tablet Commonly known as: Lipitor Take 1 tablet (10 mg total) by mouth daily.   Magnesium 500 MG Tabs Take 500 mg by mouth.   oxyCODONE-acetaminophen 5-325 MG tablet Commonly known as: Percocet Take 1 tablet by mouth every 6 (six) hours as needed for severe pain.   Vitamin D 125 MCG (5000 UT) Caps Take 5,000 Units by mouth daily.        Discharge Instructions:  [facility]  Discharge Instructions Endovascular Aortic Aneurysm Repair  Please refer to the following instructions for your post-procedure care. Your surgeon or Physician Assistant will discuss any changes with you.  Activity  You are encouraged to walk as much as you can. You can slowly return to normal activities but must avoid strenuous activity and heavy lifting until your doctor tells you it's OK. Avoid activities such as vacuuming or swinging a gold club. It is normal to feel tired for several weeks after your surgery. Do not drive until your doctor gives the OK and you are no longer taking prescription pain medications. It is also normal to have difficulty with sleep habits, eating, and bowel movements after surgery. These will go away with time.  Bathing/Showering  You may shower after you go home. If you have an incision, do not soak in a bathtub, hot tub, or swim until the incision heals completely.  Incision Care  Shower every day. Clean your incision with mild soap and water. Pat the area dry with a clean towel. You do not need a bandage unless otherwise instructed. Do not apply any ointments or creams to your incision. If you clothing is irritating, you may cover your incision with a dry gauze  pad.  Diet  Resume your normal diet. There are no special food restrictions following this procedure. A low fat/low cholesterol diet is recommended for all patients with vascular disease. In order to heal from your surgery, it is CRITICAL to get adequate nutrition. Your body requires vitamins, minerals, and protein. Vegetables are the best source of vitamins and minerals. Vegetables also provide the perfect balance of protein. Processed food has little nutritional value, so try to avoid this.  Medications  Resume taking all of your medications unless your doctor or Physician Assistnat tells you not to. If your incision is causing pain, you may take over-the-counter pain relievers such as acetaminophen (Tylenol). If you were prescribed a stronger pain medication, please be aware these medications can cause nausea and constipation. Prevent nausea by taking the medication with a snack or meal. Avoid constipation by drinking plenty of fluids  and eating foods with a high amount of fiber, such as fruits, vegetables, and grains.  Do not take Tylenol if you are taking prescription pain medications.   Follow up  [place] office will schedule a follow-up appointment with a C.T. scan 3-4 weeks after your surgery.  Please call us immediately for any of the following conditions  Severe or worsening pain in your legs or feet or in your abdomen back or chest. Increased pain, redness, drainage (pus) from your incision sit. Increased abdominal pain, bloating, nausea, vomiting or persistent diarrhea. Fever of 101 degrees or higher. Swelling in your leg (s),  Reduce your risk of vascular disease  Stop smoking. If you would like help call [facility] at 1-800-QUIT-NOW (928)470-9788) or [place] at [phone]. Manage your cholesterol Maintain a desired weight Control your diabetes Keep your blood pressure down  If you have questions, please call the office at [phone].   Prescriptions given: 1.   Roxicet #8 No Refill 2.  Asa 81mg  daily OTC 3.  Lipitor 10mg  daily #30 eleven refills  Disposition: home  Patient's condition: is Good  Follow up: 1. Dr. Stanford Breed in 4 weeks with CTA protocol   Leontine Locket, PA-C [facility] [phone] 01/28/2021  7:17 AM   - For VQI Registry use - Post-op:  Time to Extubation: [x]  In OR, [ ]  < 12 hrs, [ ]  12-24 hrs, [ ]  >=24 hrs Vasopressors Req. Post-op: No MI: No., [ ]  Troponin only, [ ]  EKG or Clinical New Arrhythmia: No CHF: No ICU Stay: 1 day in progressive Transfusion: No     If yes, n/a units given  Complications: Resp failure: No., [ ]  Pneumonia, [ ]  Ventilator Chg in renal function: No., [ ]  Inc. Cr > 0.5, [ ]  Temp. Dialysis,  [ ]  Permanent dialysis Leg ischemia: No., no Surgery needed, [ ]  Yes, Surgery needed,  [ ]  Amputation Bowel ischemia: No., [ ]  Medical Rx, [ ]  Surgical Rx Wound complication: No., [ ]  Superficial separation/infection, [ ]  Return to OR Return to OR: No  Return to OR for bleeding: No Stroke: No., [ ]  Minor, [ ]  Major  Discharge medications: Statin use:  Yes  ASA use:  Yes  Plavix use:  No  Beta blocker use:  No  ARB use:  No ACEI use:  No CCB use:  No

## 2021-01-28 NOTE — Progress Notes (Addendum)
  Progress Note    01/28/2021 7:09 AM 1 Day Post-Op  Subjective:  no complaints  Afebrile HR 60's-90's  790'W-409'B systolic 353% RA  Vitals:   01/27/21 2252 01/28/21 0346  BP: (!) 142/93 138/85  Pulse: 88 74  Resp: 16 17  Temp: 97.9 F (36.6 C) 98 F (36.7 C)  SpO2: 100% 100%    Physical Exam: Cardiac:  regular Lungs:  non labored Incisions:  bilateral groins are soft without hematoma Extremities:  easily palpable DP pulses bilaterally Abdomen:  soft, NT  CBC    Component Value Date/Time   WBC 9.2 01/28/2021 0205   RBC 3.95 (L) 01/28/2021 0205   HGB 11.8 (L) 01/28/2021 0205   HCT 35.4 (L) 01/28/2021 0205   PLT 251 01/28/2021 0205   MCV 89.6 01/28/2021 0205   MCH 29.9 01/28/2021 0205   MCHC 33.3 01/28/2021 0205   RDW 13.3 01/28/2021 0205   LYMPHSABS 1,526 10/01/2020 0956   EOSABS 72 10/01/2020 0956   BASOSABS 18 10/01/2020 0956    BMET    Component Value Date/Time   NA 136 01/28/2021 0205   K 4.2 01/28/2021 0205   CL 103 01/28/2021 0205   CO2 25 01/28/2021 0205   GLUCOSE 117 (H) 01/28/2021 0205   BUN 15 01/28/2021 0205   CREATININE 1.24 01/28/2021 0205   CREATININE 1.27 (H) 10/01/2020 0956   CALCIUM 9.0 01/28/2021 0205   GFRNONAA 59 (L) 01/28/2021 0205   GFRNONAA 53 (L) 10/01/2020 0956   GFRAA 61 10/01/2020 0956    INR    Component Value Date/Time   INR 1.2 01/27/2021 1210     Intake/Output Summary (Last 24 hours) at 01/28/2021 0709 Last data filed at 01/27/2021 1900 Gross per 24 hour  Intake 1600 ml  Output 1675 ml  Net -75 ml     Assessment/Plan:  80 y.o. male is s/p:  EVAR  1 Day Post-Op   -pt with palpable DP pulses bilaterally and groins are soft without hematoma -tolerating diet -pt came in with foley so we will leave this in as he has appt with urology next week. -ambulate in hallways -discharge home later this morning. -PDMP reviewed   Leontine Locket, PA-C [facility] [phone] 01/28/2021 7:09  AM

## 2021-02-01 DIAGNOSIS — R338 Other retention of urine: Secondary | ICD-10-CM | POA: Diagnosis not present

## 2021-02-04 ENCOUNTER — Other Ambulatory Visit: Payer: Self-pay

## 2021-02-04 DIAGNOSIS — I714 Abdominal aortic aneurysm, without rupture, unspecified: Secondary | ICD-10-CM

## 2021-02-05 ENCOUNTER — Encounter (HOSPITAL_COMMUNITY): Payer: Self-pay | Admitting: Emergency Medicine

## 2021-02-05 ENCOUNTER — Other Ambulatory Visit: Payer: Self-pay

## 2021-02-05 ENCOUNTER — Emergency Department (HOSPITAL_COMMUNITY)
Admission: EM | Admit: 2021-02-05 | Discharge: 2021-02-05 | Disposition: A | Discharge status: Home / Self Care | Payer: Medicare HMO | Attending: Emergency Medicine | Admitting: Emergency Medicine

## 2021-02-05 DIAGNOSIS — I129 Hypertensive chronic kidney disease with stage 1 through stage 4 chronic kidney disease, or unspecified chronic kidney disease: Secondary | ICD-10-CM | POA: Insufficient documentation

## 2021-02-05 DIAGNOSIS — E1122 Type 2 diabetes mellitus with diabetic chronic kidney disease: Secondary | ICD-10-CM | POA: Insufficient documentation

## 2021-02-05 DIAGNOSIS — T83091A Other mechanical complication of indwelling urethral catheter, initial encounter: Secondary | ICD-10-CM | POA: Insufficient documentation

## 2021-02-05 DIAGNOSIS — Z79899 Other long term (current) drug therapy: Secondary | ICD-10-CM | POA: Diagnosis not present

## 2021-02-05 DIAGNOSIS — T839XXA Unspecified complication of genitourinary prosthetic device, implant and graft, initial encounter: Secondary | ICD-10-CM

## 2021-02-05 DIAGNOSIS — Z7982 Long term (current) use of aspirin: Secondary | ICD-10-CM | POA: Diagnosis not present

## 2021-02-05 DIAGNOSIS — T83098A Other mechanical complication of other indwelling urethral catheter, initial encounter: Secondary | ICD-10-CM | POA: Diagnosis not present

## 2021-02-05 DIAGNOSIS — Y846 Urinary catheterization as the cause of abnormal reaction of the patient, or of later complication, without mention of misadventure at the time of the procedure: Secondary | ICD-10-CM | POA: Diagnosis not present

## 2021-02-05 DIAGNOSIS — N182 Chronic kidney disease, stage 2 (mild): Secondary | ICD-10-CM | POA: Diagnosis not present

## 2021-02-05 LAB — URINALYSIS, ROUTINE W REFLEX MICROSCOPIC
Bilirubin Urine: NEGATIVE
Glucose, UA: NEGATIVE mg/dL
Hgb urine dipstick: NEGATIVE
Ketones, ur: NEGATIVE mg/dL
Leukocytes,Ua: NEGATIVE
Nitrite: NEGATIVE
Protein, ur: NEGATIVE mg/dL
Specific Gravity, Urine: 1.011 (ref 1.005–1.030)
pH: 8 (ref 5.0–8.0)

## 2021-02-05 LAB — COMPREHENSIVE METABOLIC PANEL
ALT: 28 U/L (ref 0–44)
AST: 21 U/L (ref 15–41)
Albumin: 3.1 g/dL — ABNORMAL LOW (ref 3.5–5.0)
Alkaline Phosphatase: 58 U/L (ref 38–126)
Anion gap: 10 (ref 5–15)
BUN: 10 mg/dL (ref 8–23)
CO2: 28 mmol/L (ref 22–32)
Calcium: 9.7 mg/dL (ref 8.9–10.3)
Chloride: 98 mmol/L (ref 98–111)
Creatinine, Ser: 1.24 mg/dL (ref 0.61–1.24)
GFR, Estimated: 59 mL/min — ABNORMAL LOW (ref >60)
Glucose, Bld: 120 mg/dL — ABNORMAL HIGH (ref 70–99)
Potassium: 4 mmol/L (ref 3.5–5.1)
Sodium: 136 mmol/L (ref 135–145)
Total Bilirubin: 0.5 mg/dL (ref 0.3–1.2)
Total Protein: 7.8 g/dL (ref 6.5–8.1)

## 2021-02-05 LAB — CBC WITH DIFFERENTIAL/PLATELET
Abs Immature Granulocytes: 0.06 10*3/uL (ref 0.00–0.07)
Basophils Absolute: 0 10*3/uL (ref 0.0–0.1)
Basophils Relative: 0 %
Eosinophils Absolute: 0.1 10*3/uL (ref 0.0–0.5)
Eosinophils Relative: 1 %
HCT: 39 % (ref 39.0–52.0)
Hemoglobin: 12.5 g/dL — ABNORMAL LOW (ref 13.0–17.0)
Immature Granulocytes: 1 %
Lymphocytes Relative: 18 %
Lymphs Abs: 1.2 10*3/uL (ref 0.7–4.0)
MCH: 29.7 pg (ref 26.0–34.0)
MCHC: 32.1 g/dL (ref 30.0–36.0)
MCV: 92.6 fL (ref 80.0–100.0)
Monocytes Absolute: 0.4 10*3/uL (ref 0.1–1.0)
Monocytes Relative: 6 %
Neutro Abs: 5 10*3/uL (ref 1.7–7.7)
Neutrophils Relative %: 74 %
Platelets: 428 10*3/uL — ABNORMAL HIGH (ref 150–400)
RBC: 4.21 MIL/uL — ABNORMAL LOW (ref 4.22–5.81)
RDW: 13.7 % (ref 11.5–15.5)
WBC: 6.8 10*3/uL (ref 4.0–10.5)
nRBC: 0 % (ref 0.0–0.2)

## 2021-02-05 MED ORDER — LORAZEPAM 2 MG/ML IJ SOLN: 2.0000 mg | Freq: Once | INTRAMUSCULAR | Status: DC

## 2021-02-05 MED ORDER — SODIUM CHLORIDE 0.9 % IV BOLUS: 1000.0000 mL | Freq: Once | INTRAVENOUS | Status: DC

## 2021-02-05 NOTE — ED Provider Notes (Signed)
[facility] EMERGENCY DEPARTMENT Provider Note   CSN: [id number] Arrival date & time: 02/05/21  5329     History No chief complaint on file.   Bryan Morgan is a 80 y.o. male history of AAA, BPH with urinary retention, here presenting with Foley not draining.  Patient was noted to be in urinary retention about a month ago.  Patient states that he had a Foley placed at that time.  He then went to [facility] urology and the catheter was replaced on Tuesday this week.  He states that the catheter got clogged since yesterday.  He states that no urine is coming out.  He has severe suprapubic pressure.  He is not currently on any antibiotics.  The history is provided by the patient.      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   Abnormal TSH 10/02/2020   Mild hypertension 12/26/2019   Medication therapy management recommendation refused by patient 05/16/2019   History of adenomatous polyp of colon 11/04/2018   CKD stage 2 due to type 2 diabetes mellitus ([place]) 08/12/2018   Hyperlipidemia due to type 2 diabetes mellitus ([place]) 08/06/2018   Diet-controlled diabetes mellitus ([place]) 08/06/2018   Vitamin D deficiency 08/06/2018   Benign prostatic hyperplasia without lower urinary tract symptoms 08/05/2018   Elevated PSA 08/05/2018   Gastroesophageal reflux disease without esophagitis 08/05/2018   Overweight (BMI 25.0-29.9)  07/31/2018    Past Surgical History:  Procedure Laterality Date   ABDOMINAL AORTIC ENDOVASCULAR STENT GRAFT N/A 01/27/2021   Procedure: ABDOMINAL AORTIC ENDOVASCULAR STENT GRAFT REPAIR;  Surgeon: Cherre Robins, MD;  Location: [facility] OR;  Service: Vascular;  Laterality: N/A;   CATARACT EXTRACTION, BILATERAL  2007   ULTRASOUND GUIDANCE FOR VASCULAR ACCESS Bilateral 01/27/2021   Procedure: ULTRASOUND GUIDANCE FOR VASCULAR ACCESS, BILATERAL FEMORAL ARTERIES;  Surgeon: Cherre Robins, MD;  Location: [facility] OR;  Service: Vascular;  Laterality: Bilateral;       Family History  Problem Relation Age of Onset   Diabetes Sister    COPD Brother    Diabetes Sister    Colon cancer Neg Hx    Esophageal cancer Neg Hx    Stomach cancer Neg Hx    Pancreatic cancer Neg Hx    Liver disease Neg Hx     Social History   Tobacco Use   Smoking status: Never   Smokeless tobacco: Never  Vaping Use   Vaping Use: Never used  Substance Use Topics   Alcohol use: No    Comment: rare, 1 drink twice a year   Drug use: No    Home Medications Prior to Admission medications   Medication Sig Start Date End Date Taking? Authorizing Provider  aspirin EC 81 MG tablet Take 1 tablet (81 mg total) by mouth daily. 01/28/21   Rhyne, Hulen Shouts, PA-C  atorvastatin (LIPITOR) 10 MG tablet Take 1 tablet (10 mg total) by mouth daily.  01/28/21 01/28/22  Rhyne, Hulen Shouts, PA-C  Cholecalciferol (VITAMIN D) 125 MCG (5000 UT) CAPS Take 5,000 Units by mouth daily.    [provider]  Magnesium 500 MG TABS Take 500 mg by mouth.    [provider]  oxyCODONE-acetaminophen (PERCOCET) 5-325 MG tablet Take 1 tablet by mouth every 6 (six) hours as needed for severe pain. 01/28/21   Gabriel Earing, PA-C    Allergies    Patient has no known allergies.  Review of Systems   Review of Systems  Genitourinary:  Positive for difficulty urinating.  All other systems reviewed and are negative.  Physical Exam Updated  Vital Signs BP (!) 153/96   Pulse 94   Temp 98 F (36.7 C) (Oral)   Resp 18   SpO2 100%   Physical Exam Vitals and nursing note reviewed.  Constitutional:      Comments: Chronically ill  HENT:     Head: Normocephalic.     Nose: Nose normal.     Mouth/Throat:     Mouth: Mucous membranes are moist.  Eyes:     Extraocular Movements: Extraocular movements intact.     Pupils: Pupils are equal, round, and reactive to light.  Cardiovascular:     Rate and Rhythm: Normal rate and regular rhythm.     Pulses: Normal pulses.     Heart sounds: Normal heart sounds.  Pulmonary:     Effort: Pulmonary effort is normal.     Breath sounds: Normal breath sounds.  Abdominal:     Comments: Bladder is distended.  Foley catheter with no urine in the bag   Musculoskeletal:        General: Normal range of motion.     Cervical back: Normal range of motion and neck supple.  Skin:    General: Skin is warm.     Capillary Refill: Capillary refill takes less than 2 seconds.  Neurological:     General: No focal deficit present.     Mental Status: He is oriented to person, place, and time.  Psychiatric:        Mood and Affect: Mood normal.        Behavior: Behavior normal.    ED Results / Procedures / Treatments   Labs (all labs ordered are listed, but only abnormal results are displayed) Labs Reviewed  URINE CULTURE  CBC WITH DIFFERENTIAL/PLATELET  URINALYSIS, ROUTINE W REFLEX MICROSCOPIC  COMPREHENSIVE METABOLIC PANEL    EKG None  Radiology No results found.  Procedures Procedures     Medications Ordered in ED Medications - No data to display  ED Course  I have reviewed the triage vital signs and the nursing notes.  Pertinent labs & imaging results that were available during my care of the patient were reviewed by me and considered in my medical decision making (see chart for details).    MDM Rules/Calculators/A&P                           Bryan Morgan is a 80 y.o.  male here with Foley catheter not draining.  I was able to manually irrigate the catheter.  Then clear urine came out.  There is no obvious blood clots.  I suspect that there may be some debris that clot at the catheter.  We will send UA to rule out infection. We will also get basic blood work  6:39 PM Labs unremarkable and urinalysis negative.  800 cc  drained.  He now feels better.  I told him to follow-up with his urologist.  Final Clinical Impression(s) / ED Diagnoses Final diagnoses:  None    Rx / DC Orders ED Discharge Orders     None        Drenda Freeze, MD 02/05/21 1839

## 2021-02-05 NOTE — ED Triage Notes (Signed)
Pt has foley for enlarged prostate and difficulty urinating.  Pt reports no urinary output in foley since last night.  Denies pain at present.  States he only has pain when he has the urge to urinate.

## 2021-02-05 NOTE — Discharge Instructions (Addendum)
Your Foley catheter was clogged and now is draining.  Please stay hydrated.  See urology for follow-up  Return to ER if you have trouble urinating, Foley not draining, blood in the catheter

## 2021-02-05 NOTE — ED Provider Notes (Signed)
Emergency Medicine Provider Triage Evaluation Note  COULSON WEHNER , a 80 y.o. male  was evaluated in triage.  Pt complains of no urinary drainage from catheter since yesterday.  Patient has a catheter in place due to enlarged prostate, difficulty urinating at baseline.  Patient denies any bleeding or clots in the catheter, has not had a similar problem before.  Last urinary catheter was yesterday afternoon.  Patient has minimal pain with urinary urgency, denies any leaking around catheter.  Review of Systems  Positive: Catheter related problem Negative: hematuria  Physical Exam  BP (!) 155/84 (BP Location: Right Arm)   Pulse 97   Temp 98 F (36.7 C) (Oral)   Resp 18   SpO2 97%  Gen:   Awake, no distress   Resp:  Normal effort  MSK:   Moves extremities without difficulty  Other:  Catheter in place with appropriate appearance of penis and catheter line -- no bleeding in catheter bag, no phimosis, paraphimosis or other redness of penis  Medical Decision Making  Medically screening exam initiated at 9:21 AM.  Appropriate orders placed.  BERTIL BRICKEY was informed that the remainder of the evaluation will be completed by another provider, this initial triage assessment does not replace that evaluation, and the importance of remaining in the ED until their evaluation is complete.  Catheter issue   Dorien Chihuahua 02/05/21 3557    Charlesetta Shanks, MD 02/10/21 [phone]

## 2021-02-06 LAB — URINE CULTURE

## 2021-02-15 DIAGNOSIS — R338 Other retention of urine: Secondary | ICD-10-CM | POA: Diagnosis not present

## 2021-02-18 ENCOUNTER — Other Ambulatory Visit: Payer: Self-pay

## 2021-02-18 ENCOUNTER — Ambulatory Visit (HOSPITAL_COMMUNITY)
Admission: RE | Admit: 2021-02-18 | Discharge: 2021-02-18 | Disposition: A | Discharge status: Home / Self Care | Payer: Medicare HMO | Source: Ambulatory Visit | Attending: Vascular Surgery | Admitting: Vascular Surgery

## 2021-02-18 ENCOUNTER — Encounter (HOSPITAL_COMMUNITY): Payer: Self-pay

## 2021-02-18 DIAGNOSIS — N281 Cyst of kidney, acquired: Secondary | ICD-10-CM | POA: Diagnosis not present

## 2021-02-18 DIAGNOSIS — I714 Abdominal aortic aneurysm, without rupture, unspecified: Secondary | ICD-10-CM

## 2021-02-18 DIAGNOSIS — K5730 Diverticulosis of large intestine without perforation or abscess without bleeding: Secondary | ICD-10-CM | POA: Diagnosis not present

## 2021-02-18 DIAGNOSIS — I7143 Infrarenal abdominal aortic aneurysm, without rupture: Secondary | ICD-10-CM | POA: Diagnosis not present

## 2021-02-18 MED ORDER — SODIUM CHLORIDE (PF) 0.9 % IJ SOLN
INTRAMUSCULAR | Status: AC
  Filled 2021-02-18: qty 50

## 2021-02-18 MED ORDER — IOHEXOL 350 MG/ML SOLN
100.0000 mL | Freq: Once | INTRAVENOUS | Status: AC | PRN
  Administered 2021-02-18: 100 mL via INTRAVENOUS

## 2021-02-23 ENCOUNTER — Other Ambulatory Visit: Payer: Self-pay

## 2021-02-23 ENCOUNTER — Emergency Department (HOSPITAL_BASED_OUTPATIENT_CLINIC_OR_DEPARTMENT_OTHER)
Admission: EM | Admit: 2021-02-23 | Discharge: 2021-02-23 | Disposition: A | Discharge status: Home / Self Care | Payer: Medicare HMO | Attending: Emergency Medicine | Admitting: Emergency Medicine

## 2021-02-23 ENCOUNTER — Encounter (HOSPITAL_BASED_OUTPATIENT_CLINIC_OR_DEPARTMENT_OTHER): Payer: Self-pay | Admitting: *Deleted

## 2021-02-23 DIAGNOSIS — Z7982 Long term (current) use of aspirin: Secondary | ICD-10-CM | POA: Insufficient documentation

## 2021-02-23 DIAGNOSIS — N182 Chronic kidney disease, stage 2 (mild): Secondary | ICD-10-CM | POA: Insufficient documentation

## 2021-02-23 DIAGNOSIS — R1030 Lower abdominal pain, unspecified: Secondary | ICD-10-CM | POA: Diagnosis not present

## 2021-02-23 DIAGNOSIS — I129 Hypertensive chronic kidney disease with stage 1 through stage 4 chronic kidney disease, or unspecified chronic kidney disease: Secondary | ICD-10-CM | POA: Diagnosis not present

## 2021-02-23 DIAGNOSIS — T83098A Other mechanical complication of other indwelling urethral catheter, initial encounter: Secondary | ICD-10-CM | POA: Insufficient documentation

## 2021-02-23 DIAGNOSIS — E1122 Type 2 diabetes mellitus with diabetic chronic kidney disease: Secondary | ICD-10-CM | POA: Diagnosis not present

## 2021-02-23 DIAGNOSIS — T839XXA Unspecified complication of genitourinary prosthetic device, implant and graft, initial encounter: Secondary | ICD-10-CM

## 2021-02-23 NOTE — Discharge Instructions (Signed)
You were seen today for Foley catheter issues.  Your Foley catheter was replaced with good return of urine.  Follow-up with your urologist.

## 2021-02-23 NOTE — ED Triage Notes (Signed)
Pt reports he has not had any urine output in his catheter bag since last night when he emptied it before bed.

## 2021-02-23 NOTE — ED Provider Notes (Signed)
[name] Provider Note   CSN: [id number] Arrival date & time: 02/23/21  3419     History Chief Complaint  Patient presents with   Urinary Retention    Bryan Morgan is a 80 y.o. male.  HPI     This is an 80 year old male with a history of BPH and urinary retention with an indwelling catheter, AAA who presents with concerns for catheter blockage.  Patient reports that his catheter was draining normally.  However overnight he did not note any drainage in the catheter bag.  He does have some lower abdominal pressure.  He states he has had this catheter for approximately 1 week.  He has had a chronic indwelling catheter for the last 6 to 8 weeks after having acute retention.  Denies back pain or fevers.  Past Medical History:  Diagnosis Date   AAA (abdominal aortic aneurysm)    BPH (benign prostatic hyperplasia)    with retention, s/p foley catheter placement in [place] ED 12/29/20   History of needle biopsy of prostate with negative result    Legal blindness    Positive colorectal cancer screening using Cologuard test 08/20/2018   08/18/2018 - GI referral placed; tubular & tubulovillous adenomas 09/12/18 colonoscopy, 3 year f/u rec   Pre-diabetes 06/04/2020    Patient Active Problem List   Diagnosis Date Noted   AAA (abdominal aortic aneurysm) 01/27/2021   Legal blindness 12/12/2020   Abnormal TSH 10/02/2020   Mild hypertension 12/26/2019   Medication therapy management recommendation refused by patient 05/16/2019   History of adenomatous polyp of colon 11/04/2018   CKD stage 2 due to type 2 diabetes mellitus ([place]) 08/12/2018   Hyperlipidemia due to type 2 diabetes mellitus ([place]) 08/06/2018   Diet-controlled diabetes mellitus ([place]) 08/06/2018   Vitamin D deficiency 08/06/2018   Benign prostatic hyperplasia without lower urinary tract symptoms 08/05/2018   Elevated PSA 08/05/2018   Gastroesophageal reflux disease without esophagitis  08/05/2018   Overweight (BMI 25.0-29.9) 07/31/2018    Past Surgical History:  Procedure Laterality Date   ABDOMINAL AORTIC ENDOVASCULAR STENT GRAFT N/A 01/27/2021   Procedure: ABDOMINAL AORTIC ENDOVASCULAR STENT GRAFT REPAIR;  Surgeon: Cherre Robins, MD;  Location: [facility] OR;  Service: Vascular;  Laterality: N/A;   CATARACT EXTRACTION, BILATERAL  2007   ULTRASOUND GUIDANCE FOR VASCULAR ACCESS Bilateral 01/27/2021   Procedure: ULTRASOUND GUIDANCE FOR VASCULAR ACCESS, BILATERAL FEMORAL ARTERIES;  Surgeon: Cherre Robins, MD;  Location: [facility] OR;  Service: Vascular;  Laterality: Bilateral;       Family History  Problem Relation Age of Onset   Diabetes Sister    COPD Brother    Diabetes Sister    Colon cancer Neg Hx    Esophageal cancer Neg Hx    Stomach cancer Neg Hx    Pancreatic cancer Neg Hx    Liver disease Neg Hx     Social History   Tobacco Use   Smoking status: Never   Smokeless tobacco: Never  Vaping Use   Vaping Use: Never used  Substance Use Topics   Alcohol use: No    Comment: rare, 1 drink twice a year   Drug use: No    Home Medications Prior to Admission medications   Medication Sig Start Date End Date Taking? Authorizing Provider  aspirin EC 81 MG tablet Take 1 tablet (81 mg total) by mouth daily. 01/28/21   Rhyne, Hulen Shouts, PA-C  atorvastatin (LIPITOR) 10 MG tablet Take 1 tablet (10 mg total)  by mouth daily. 01/28/21 01/28/22  Rhyne, Hulen Shouts, PA-C  Cholecalciferol (VITAMIN D) 125 MCG (5000 UT) CAPS Take 5,000 Units by mouth daily.    [provider]  Magnesium 500 MG TABS Take 500 mg by mouth.    [provider]  oxyCODONE-acetaminophen (PERCOCET) 5-325 MG tablet Take 1 tablet by mouth every 6 (six) hours as needed for severe pain. 01/28/21   Gabriel Earing, PA-C    Allergies    Patient has no known allergies.  Review of Systems   Review of Systems  Constitutional:  Negative for fever.  Respiratory:  Negative for shortness of  breath.   Cardiovascular:  Negative for chest pain.  Gastrointestinal:  Negative for abdominal pain.  Genitourinary:  Positive for difficulty urinating.  All other systems reviewed and are negative.  Physical Exam Updated Vital Signs BP (!) 149/90 (BP Location: Right Arm)   Pulse 85   Temp 98.1 F (36.7 C)   Resp 16   SpO2 99%   Physical Exam Vitals and nursing note reviewed.  Constitutional:      Appearance: He is well-developed.     Comments: Elderly, chronic ill-appearing  HENT:     Head: Normocephalic and atraumatic.  Eyes:     Pupils: Pupils are equal, round, and reactive to light.  Cardiovascular:     Rate and Rhythm: Normal rate and regular rhythm.     Heart sounds: No murmur heard. Pulmonary:     Effort: Pulmonary effort is normal. No respiratory distress.     Breath sounds: Normal breath sounds. No wheezing.  Abdominal:     Palpations: Abdomen is soft.     Tenderness: There is no abdominal tenderness. There is no rebound.  Musculoskeletal:     Cervical back: Neck supple.     Right lower leg: No edema.     Left lower leg: No edema.  Lymphadenopathy:     Cervical: No cervical adenopathy.  Skin:    General: Skin is warm and dry.  Neurological:     Mental Status: He is alert and oriented to person, place, and time.  Psychiatric:        Mood and Affect: Mood normal.    ED Results / Procedures / Treatments   Labs (all labs ordered are listed, but only abnormal results are displayed) Labs Reviewed - No data to display  EKG None  Radiology No results found.  Procedures Procedures   Medications Ordered in ED Medications - No data to display  ED Course  I have reviewed the triage vital signs and the nursing notes.  Pertinent labs & imaging results that were available during my care of the patient were reviewed by me and considered in my medical decision making (see chart for details).    MDM Rules/Calculators/A&P                            Patient presents with concerns for urinary retention and clogged Foley catheter.  He is nontoxic.  Vital signs are reassuring.  He has no other complaints.  No fevers.  Denies infectious symptoms.  Nursing attempted to flush the catheter without success.  Foley catheter was placed with return of clear urine.  There were no clots.  No purulence.  Low suspicion for infection.  Will discharge with urology follow-up.  After history, exam, and medical workup I feel the patient has been appropriately medically screened and is safe for discharge home.  Pertinent diagnoses were discussed with the patient. Patient was given return precautions.  Final Clinical Impression(s) / ED Diagnoses Final diagnoses:  Problem with Foley catheter, initial encounter [facility])    Rx / DC Orders ED Discharge Orders     None        Merryl Hacker, MD 02/23/21 [phone]

## 2021-02-25 DIAGNOSIS — R338 Other retention of urine: Secondary | ICD-10-CM | POA: Diagnosis not present

## 2021-02-28 NOTE — Progress Notes (Signed)
[facility] PROGRESS NOTE  ASSESSMENT / PLAN: Bryan Morgan is a 80 y.o. male status post EVAR 01/27/21 for 56mm infrarenal abdominal aortic aneurysm noted during workup for urinary retention. 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  Counseled the patient extensively about the natural history of type II endoleak after EVAR.  We will continue surveillance.  We will plan for follow-up with CT angiogram of the abdomen pelvis in 6 months.  SUBJECTIVE: No complaints. Did well after EVAR. Still having difficulty with bladder.   OBJECTIVE: BP 124/71 (BP Location: Left Arm, Patient Position: Sitting, Cuff Size: Large)   Pulse 84   Temp 98.2 F (36.8 C)   Resp 20   Ht 6\' 2"  (1.88 m)   Wt 214 lb (97.1 kg)   SpO2 97%   BMI 27.48 kg/m   Constitutional: well appearing. no acute distress. CNS: normal gait and station Cardiac: RRR. Pulmonary: unlabored Abdomen: soft, not tender Vascular: access sites healed  CBC Latest Ref Rng & Units 02/05/2021 01/28/2021 01/27/2021  WBC 4.0 - 10.5 K/uL 6.8 9.2 5.5  Hemoglobin 13.0 - 17.0 g/dL 12.5(L) 11.8(L) 11.2(L)  Hematocrit 39.0 - 52.0 % 39.0 35.4(L) 35.3(L)  Platelets 150 - 400 K/uL 428(H) 251 231     CMP Latest Ref Rng & Units 02/05/2021 01/28/2021 01/27/2021  Glucose 70 - 99 mg/dL 120(H) 117(H) 131(H)  BUN 8 - 23 mg/dL 10 15 14   Creatinine 0.61 - 1.24 mg/dL 1.24 1.24 1.14  Sodium 135 - 145 mmol/L 136 136 139  Potassium 3.5 - 5.1 mmol/L 4.0 4.2 3.9  Chloride 98 - 111 mmol/L 98 103 104  CO2 22 - 32 mmol/L 28 25 28   Calcium 8.9 - 10.3 mg/dL 9.7 9.0 8.9  Total Protein 6.5 - 8.1 g/dL 7.8 - -  Total Bilirubin 0.3 - 1.2 mg/dL 0.5 - -  Alkaline Phos 38 - 126 U/L 58 - -  AST 15 - 41 U/L 21 - -  ALT 0 - 44 U/L 28 - -    Pre-clinic visit CT angiogram reviewed in detail.  Patient has a small, persistent type II endoleak.  Otherwise satisfactory exclusion of the 72 mm  infrarenal abdominal aortic aneurysm.  Yevonne Aline. Stanford Breed, MD [facility] Phone Number: [phone] 02/28/2021 8:49 PM

## 2021-03-01 ENCOUNTER — Other Ambulatory Visit: Payer: Self-pay

## 2021-03-01 ENCOUNTER — Ambulatory Visit (INDEPENDENT_AMBULATORY_CARE_PROVIDER_SITE_OTHER): Payer: Medicare HMO | Admitting: Vascular Surgery

## 2021-03-01 ENCOUNTER — Encounter: Payer: Self-pay | Admitting: Vascular Surgery

## 2021-03-01 VITALS — BP 124/71 | HR 84 | Temp 98.2°F | Resp 20 | Ht 74.0 in | Wt 214.0 lb

## 2021-03-01 DIAGNOSIS — Z8679 Personal history of other diseases of the circulatory system: Secondary | ICD-10-CM

## 2021-03-01 DIAGNOSIS — Z9889 Other specified postprocedural states: Secondary | ICD-10-CM

## 2021-03-03 ENCOUNTER — Encounter: Payer: Self-pay | Admitting: Internal Medicine

## 2021-03-04 DIAGNOSIS — H9190 Unspecified hearing loss, unspecified ear: Secondary | ICD-10-CM | POA: Insufficient documentation

## 2021-03-04 DIAGNOSIS — H903 Sensorineural hearing loss, bilateral: Secondary | ICD-10-CM | POA: Diagnosis not present

## 2021-03-04 DIAGNOSIS — H6983 Other specified disorders of Eustachian tube, bilateral: Secondary | ICD-10-CM | POA: Diagnosis not present

## 2021-03-07 DIAGNOSIS — R339 Retention of urine, unspecified: Secondary | ICD-10-CM | POA: Diagnosis not present

## 2021-03-07 DIAGNOSIS — Z79899 Other long term (current) drug therapy: Secondary | ICD-10-CM | POA: Diagnosis not present

## 2021-03-07 DIAGNOSIS — X58XXXA Exposure to other specified factors, initial encounter: Secondary | ICD-10-CM | POA: Diagnosis not present

## 2021-03-07 DIAGNOSIS — Z7982 Long term (current) use of aspirin: Secondary | ICD-10-CM | POA: Diagnosis not present

## 2021-03-07 DIAGNOSIS — R39198 Other difficulties with micturition: Secondary | ICD-10-CM | POA: Diagnosis not present

## 2021-03-07 DIAGNOSIS — T83091A Other mechanical complication of indwelling urethral catheter, initial encounter: Secondary | ICD-10-CM | POA: Diagnosis not present

## 2021-03-07 DIAGNOSIS — T83098A Other mechanical complication of other indwelling urethral catheter, initial encounter: Secondary | ICD-10-CM | POA: Diagnosis not present

## 2021-03-11 DIAGNOSIS — N401 Enlarged prostate with lower urinary tract symptoms: Secondary | ICD-10-CM | POA: Diagnosis not present

## 2021-03-11 DIAGNOSIS — R338 Other retention of urine: Secondary | ICD-10-CM | POA: Diagnosis not present

## 2021-03-15 DIAGNOSIS — T83091A Other mechanical complication of indwelling urethral catheter, initial encounter: Secondary | ICD-10-CM | POA: Diagnosis not present

## 2021-03-15 DIAGNOSIS — T83098A Other mechanical complication of other indwelling urethral catheter, initial encounter: Secondary | ICD-10-CM | POA: Diagnosis not present

## 2021-03-15 DIAGNOSIS — R339 Retention of urine, unspecified: Secondary | ICD-10-CM | POA: Diagnosis not present

## 2021-03-15 DIAGNOSIS — R140 Abdominal distension (gaseous): Secondary | ICD-10-CM | POA: Diagnosis not present

## 2021-03-15 DIAGNOSIS — X58XXXA Exposure to other specified factors, initial encounter: Secondary | ICD-10-CM | POA: Diagnosis not present

## 2021-03-15 DIAGNOSIS — Y999 Unspecified external cause status: Secondary | ICD-10-CM | POA: Diagnosis not present

## 2021-03-18 ENCOUNTER — Other Ambulatory Visit: Payer: Self-pay | Admitting: Urology

## 2021-03-22 ENCOUNTER — Other Ambulatory Visit (HOSPITAL_COMMUNITY): Payer: Medicare HMO

## 2021-03-24 NOTE — Progress Notes (Addendum)
COVID swab appointment: 03/25/21 @ 0900  COVID Vaccine Completed: yes x2 Date COVID Vaccine completed: 08/16/19, 04/02/20 Has received booster: COVID vaccine manufacturer: [place]   Date of COVID positive in last 90 days: no  PCP - Bryan Pinto, MD Cardiologist - n/a  Chest x-ray - 01/27/21 Epic EKG - 06/04/20 Epic Stress Test - n/a ECHO - yes long time ago per pt Cardiac Cath - n/a Pacemaker/ICD device last checked: n/a Spinal Cord Stimulator: n/a  Sleep Study - n/a CPAP -   Fasting Blood Sugar - pre DM, pt denies no checks at home Checks Blood Sugar _____ times a day  Blood Thinner Instructions: n/a Aspirin Instructions: Last Dose:  Activity level: Can go up a flight of stairs and perform activities of daily living without stopping and without symptoms of chest pain or shortness of breath.       Anesthesia review:   Patient denies shortness of breath, fever, cough and chest pain at PAT appointment   Patient verbalized understanding of instructions that were given to them at the PAT appointment. Patient was also instructed that they will need to review over the PAT instructions again at home before surgery.

## 2021-03-24 NOTE — Patient Instructions (Addendum)
DUE TO COVID-19 ONLY ONE VISITOR IS ALLOWED TO COME WITH YOU AND STAY IN THE WAITING ROOM ONLY DURING PRE OP AND PROCEDURE.   **NO VISITORS ARE ALLOWED IN THE SHORT STAY AREA OR RECOVERY ROOM!!**  IF YOU WILL BE ADMITTED INTO THE HOSPITAL YOU ARE ALLOWED ONLY TWO SUPPORT PEOPLE DURING VISITATION HOURS ONLY (10AM -8PM)   The support person(s) may change daily. The support person(s) must pass our screening, gel in and out, and wear a mask at all times, including in the patients room. Patients must also wear a mask when staff or their support person are in the room.  No visitors under the age of 58. Any visitor under the age of 1 must be accompanied by an adult.    COVID SWAB TESTING MUST BE COMPLETED ON:  03/25/21 @0900   You are not required to quarantine, however you are required to wear a well-fitted mask when you are out and around people not in your household.  Hand Hygiene often Do NOT share personal items Notify your provider if you are in close contact with someone who has COVID or you develop fever 100.4 or greater, new onset of sneezing, cough, sore throat, shortness of breath or body aches.    Your procedure is scheduled on: 03/29/21   Report to [facility] Main Entrance    Report to admitting at 5:15 AM   Call this number if you have problems the morning of surgery [phone]   Do not eat food :After Midnight.   May have liquids until 4:30 AM day of surgery  CLEAR LIQUID DIET  Foods Allowed                                                                     Foods Excluded  Water, Black Coffee and tea (no milk or creamer)           liquids that you cannot  Plain Jell-O in any flavor  (No red)                                    see through such as: Fruit ices (not with fruit pulp)                                            milk, soups, orange juice              Iced Popsicles (No red)                                                All solid food                                    Apple juices Sports drinks like Gatorade (No red) Lightly seasoned clear broth or consume(fat free) Sugar   Oral Hygiene is also important to reduce  your risk of infection.                                    Remember - BRUSH YOUR TEETH THE MORNING OF SURGERY WITH YOUR REGULAR TOOTHPASTE   Take these medicines the morning of surgery with A SIP OF WATER: Proscar, Rapaflo  Stop all vitamins and supplements today.                              You may not have any metal on your body including jewelry, and body piercing             Do not wear lotions, powders, cologne, or deodorant              Men may shave face and neck.   Do not bring valuables to the hospital. [place].    Patients discharged on the day of surgery will not be allowed to drive home.   Special Instructions: Bring a copy of your healthcare power of attorney and living will documents         the day of surgery if you haven't scanned them before.              Please read over the following fact sheets you were given: IF YOU HAVE QUESTIONS ABOUT YOUR PRE-OP INSTRUCTIONS PLEASE CALL [facility] - Preparing for Surgery Before surgery, you can play an important role.  Because skin is not sterile, your skin needs to be as free of germs as possible.  You can reduce the number of germs on your skin by washing with CHG (chlorahexidine gluconate) soap before surgery.  CHG is an antiseptic cleaner which kills germs and bonds with the skin to continue killing germs even after washing. Please DO NOT use if you have an allergy to CHG or antibacterial soaps.  If your skin becomes reddened/irritated stop using the CHG and inform your nurse when you arrive at Short Stay. Do not shave (including legs and underarms) for at least 48 hours prior to the first CHG shower.  You may shave your face/neck.  Please follow these instructions carefully:  1.   Shower with CHG Soap the night before surgery and the  morning of surgery.  2.  If you choose to wash your hair, wash your hair first as usual with your normal  shampoo.  3.  After you shampoo, rinse your hair and body thoroughly to remove the shampoo.                             4.  Use CHG as you would any other liquid soap.  You can apply chg directly to the skin and wash.  Gently with a scrungie or clean washcloth.  5.  Apply the CHG Soap to your body ONLY FROM THE NECK DOWN.   Do   not use on face/ open                           Wound or open sores. Avoid contact with eyes, ears mouth and   genitals (private parts).  Wash face,  Genitals (private parts) with your normal soap.             6.  Wash thoroughly, paying special attention to the area where your    surgery  will be performed.  7.  Thoroughly rinse your body with warm water from the neck down.  8.  DO NOT shower/wash with your normal soap after using and rinsing off the CHG Soap.                9.  Pat yourself dry with a clean towel.            10.  Wear clean pajamas.            11.  Place clean sheets on your bed the night of your first shower and do not  sleep with pets. Day of Surgery : Do not apply any lotions/deodorants the morning of surgery.  Please wear clean clothes to the hospital/surgery center.  FAILURE TO FOLLOW THESE INSTRUCTIONS MAY RESULT IN THE CANCELLATION OF YOUR SURGERY  PATIENT SIGNATURE_________________________________  NURSE SIGNATURE__________________________________  ________________________________________________________________________

## 2021-03-25 ENCOUNTER — Encounter (HOSPITAL_COMMUNITY): Payer: Self-pay

## 2021-03-25 ENCOUNTER — Encounter (HOSPITAL_COMMUNITY)
Admission: RE | Admit: 2021-03-25 | Discharge: 2021-03-25 | Disposition: A | Discharge status: Home / Self Care | Payer: Medicare HMO | Source: Ambulatory Visit | Attending: Urology | Admitting: Urology

## 2021-03-25 ENCOUNTER — Other Ambulatory Visit: Payer: Self-pay

## 2021-03-25 VITALS — BP 137/67 | HR 77 | Temp 98.6°F | Resp 16 | Ht 74.0 in | Wt 212.0 lb

## 2021-03-25 DIAGNOSIS — Z01812 Encounter for preprocedural laboratory examination: Secondary | ICD-10-CM | POA: Insufficient documentation

## 2021-03-25 DIAGNOSIS — Z01818 Encounter for other preprocedural examination: Secondary | ICD-10-CM

## 2021-03-25 DIAGNOSIS — Z20822 Contact with and (suspected) exposure to covid-19: Secondary | ICD-10-CM | POA: Diagnosis not present

## 2021-03-25 DIAGNOSIS — E119 Type 2 diabetes mellitus without complications: Secondary | ICD-10-CM | POA: Diagnosis not present

## 2021-03-25 HISTORY — DX: Personal history of urinary calculi: Z87.442

## 2021-03-25 LAB — HEMOGLOBIN A1C
Hgb A1c MFr Bld: 6.7 % — ABNORMAL HIGH (ref 4.8–5.6)
Mean Plasma Glucose: 145.59 mg/dL

## 2021-03-25 LAB — SARS CORONAVIRUS 2 (TAT 6-24 HRS): SARS Coronavirus 2: NEGATIVE

## 2021-03-25 LAB — GLUCOSE, CAPILLARY: Glucose-Capillary: 194 mg/dL — ABNORMAL HIGH (ref 70–99)

## 2021-03-27 ENCOUNTER — Emergency Department (HOSPITAL_COMMUNITY)
Admission: EM | Admit: 2021-03-27 | Discharge: 2021-03-27 | Disposition: A | Discharge status: Home / Self Care | Payer: Medicare HMO | Attending: Emergency Medicine | Admitting: Emergency Medicine

## 2021-03-27 ENCOUNTER — Other Ambulatory Visit: Payer: Self-pay

## 2021-03-27 ENCOUNTER — Encounter (HOSPITAL_COMMUNITY): Payer: Self-pay | Admitting: Emergency Medicine

## 2021-03-27 DIAGNOSIS — T839XXA Unspecified complication of genitourinary prosthetic device, implant and graft, initial encounter: Secondary | ICD-10-CM

## 2021-03-27 DIAGNOSIS — T83098A Other mechanical complication of other indwelling urethral catheter, initial encounter: Secondary | ICD-10-CM | POA: Diagnosis not present

## 2021-03-27 DIAGNOSIS — Z79899 Other long term (current) drug therapy: Secondary | ICD-10-CM | POA: Diagnosis not present

## 2021-03-27 DIAGNOSIS — Z7982 Long term (current) use of aspirin: Secondary | ICD-10-CM | POA: Diagnosis not present

## 2021-03-27 DIAGNOSIS — T83091A Other mechanical complication of indwelling urethral catheter, initial encounter: Secondary | ICD-10-CM | POA: Diagnosis not present

## 2021-03-27 DIAGNOSIS — Y846 Urinary catheterization as the cause of abnormal reaction of the patient, or of later complication, without mention of misadventure at the time of the procedure: Secondary | ICD-10-CM | POA: Insufficient documentation

## 2021-03-27 NOTE — Discharge Instructions (Signed)
Follow-up with your urologist as needed.

## 2021-03-27 NOTE — ED Notes (Signed)
Discharged by PA at triage.

## 2021-03-27 NOTE — ED Triage Notes (Addendum)
Pt reports foley leg bag was not draining for 2-3 hours today but is now working.  He states he is scheduled for surgery on Tuesday and would like it irrigated.  Denies pain.

## 2021-03-27 NOTE — ED Provider Notes (Signed)
[facility] EMERGENCY DEPARTMENT Provider Note   CSN: [id number] Arrival date & time: 03/27/21  1641     History  Chief Complaint  Patient presents with   foley problem    Bryan Morgan is a 81 y.o. male.  81 year old male presents with concern for his Foley not draining for the past 2 to 3 hours however upon arrival in the emergency room the Foley is draining again.  He has no other complaints would like to be discharged.  Patient is scheduled for prostate surgery in 2 days.  He denies complaints of abdominal pain, distention or retention otherwise.      Home Medications Prior to Admission medications   Medication Sig Start Date End Date Taking? Authorizing Provider  aspirin EC 81 MG tablet Take 1 tablet (81 mg total) by mouth daily. Patient not taking: Reported on 03/18/2021 01/28/21   Gabriel Earing, PA-C  atorvastatin (LIPITOR) 10 MG tablet Take 1 tablet (10 mg total) by mouth daily. Patient not taking: Reported on 03/18/2021 01/28/21 01/28/22  Gabriel Earing, PA-C  Cholecalciferol (VITAMIN D) 125 MCG (5000 UT) CAPS Take 5,000 Units by mouth daily.    [provider]  finasteride (PROSCAR) 5 MG tablet Take 5 mg by mouth daily. 01/21/21   [provider]  Magnesium 500 MG TABS Take 500 mg by mouth daily.    [provider]  oxyCODONE-acetaminophen (PERCOCET) 5-325 MG tablet Take 1 tablet by mouth every 6 (six) hours as needed for severe pain. Patient not taking: Reported on 03/01/2021 01/28/21   Gabriel Earing, PA-C  silodosin (RAPAFLO) 8 MG CAPS capsule Take 8 mg by mouth daily. 02/20/21   [provider]      Allergies    Patient has no known allergies.    Review of Systems   Review of Systems  Constitutional:  Negative for fever.  Gastrointestinal:  Negative for abdominal distention, abdominal pain, nausea and vomiting.  Genitourinary:  Positive for decreased urine volume.  Musculoskeletal:  Negative for  back pain.  Skin:  Negative for rash and wound.  Neurological:  Negative for weakness.   Physical Exam Updated Vital Signs BP (!) 176/104 (BP Location: Left Arm)    Pulse 97    Temp 98.5 F (36.9 C) (Oral)    Resp 14    SpO2 98%  Physical Exam Vitals and nursing note reviewed.  Constitutional:      General: He is not in acute distress.    Appearance: He is well-developed. He is not diaphoretic.  HENT:     Head: Normocephalic and atraumatic.  Pulmonary:     Effort: Pulmonary effort is normal.  Abdominal:     General: There is no distension.     Palpations: Abdomen is soft.     Tenderness: There is no abdominal tenderness.  Skin:    General: Skin is warm and dry.  Neurological:     Mental Status: He is alert and oriented to person, place, and time.  Psychiatric:        Behavior: Behavior normal.    ED Results / Procedures / Treatments   Labs (all labs ordered are listed, but only abnormal results are displayed) Labs Reviewed - No data to display  EKG None  Radiology No results found.  Procedures Procedures    Medications Ordered in ED Medications - No data to display  ED Course/ Medical Decision Making/ A&P Clinical Course as of 03/27/21 1659  Sun Mar 27, 3285  756 81 year old male with concern for decreased urinary output from his indwelling Foley catheter which has been in place for the past 2 weeks.  Patient is scheduled for surgery in 2 days.  On arrival in the emergency room, his back began to feel again and is draining properly at this time.  Patient would like to be discharged.  His abdomen is soft and nontender, no palpable bladder, no distention noted.  Patient is advised to follow-up with his urologist as planned. [LM]    Clinical Course User Index [LM] Tacy Learn, PA-C                           Medical Decision Making         Final Clinical Impression(s) / ED Diagnoses Final diagnoses:  Problem with Foley catheter, initial encounter  [facility])    Rx / DC Orders ED Discharge Orders     None         Roque Lias 03/27/21 1659    Dorie Rank, MD 03/27/21 [phone]

## 2021-03-28 NOTE — Anesthesia Preprocedure Evaluation (Addendum)
Anesthesia Evaluation  Patient identified by MRN, date of birth, ID band Patient awake    Reviewed: Allergy & Precautions, NPO status , Patient's Chart, lab work & pertinent test results  Airway Mallampati: II  TM Distance: >3 FB Neck ROM: Full    Dental no notable dental hx. (+) Teeth Intact, Dental Advisory Given   Pulmonary neg pulmonary ROS,    Pulmonary exam normal breath sounds clear to auscultation       Cardiovascular hypertension, Pt. on medications + Peripheral Vascular Disease (AAA s/p EVAR 01/2021)  Normal cardiovascular exam Rhythm:Regular Rate:Normal     Neuro/Psych negative neurological ROS  negative psych ROS   GI/Hepatic Neg liver ROS, GERD  ,  Endo/Other  diabetes, Well Controlled, Type 2  Renal/GU Renal InsufficiencyRenal disease  negative genitourinary   Musculoskeletal negative musculoskeletal ROS (+)   Abdominal   Peds  Hematology negative hematology ROS (+)   Anesthesia Other Findings   Reproductive/Obstetrics                           Anesthesia Physical Anesthesia Plan  ASA: 3  Anesthesia Plan: General   Post-op Pain Management: Tylenol PO (pre-op)   Induction: Intravenous  PONV Risk Score and Plan: 2 and Dexamethasone and Ondansetron  Airway Management Planned: LMA  Additional Equipment:   Intra-op Plan:   Post-operative Plan: Extubation in OR  Informed Consent: I have reviewed the patients History and Physical, chart, labs and discussed the procedure including the risks, benefits and alternatives for the proposed anesthesia with the patient or authorized representative who has indicated his/her understanding and acceptance.     Dental advisory given  Plan Discussed with: CRNA  Anesthesia Plan Comments:        Anesthesia Quick Evaluation

## 2021-03-28 NOTE — H&P (Signed)
Has had recentHistory of elevated PSA which was 7.5 in 7/00. It has fluctuated over time. It then began to rise and in 9/10 had reached 9.39/10.6%.   TRUS/Bx 02/23/09: His PSA at that time was 9.39. The study revealed an 86 cc prostate.   Pathology: No evidence of malignancy with a single core containing PIN.    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    Interval history: He continues to do well with no new voiding complaints. He also denies any bone pain or weight loss. He remains active, exercising and   -1028/22-patient with history of BPH and elevated PSA as above. Underwent negative biopsy in 2010 with 86 cc prostate at that time. He recently presented to [facility] emergency room in [place] with episode of acute urinary retention on 01/03/2021. Had progressive urinary frequency and difficulty voiding. Ultimately went into retention was seen in the emergency room noted to have 1700 cc residual when Foley was placed. Patient also had a CT scan at that time showing a punctate stone in the left kidney and bilateral renal cysts largest measuring 7.1 cm on the right and 8.8 cm on the left. Also noted to have prostamegaly with median lobe hypertrophy. 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 He is scheduled to have that repaired next week. The patient was not initiated on any BPH medications and is now here with indwelling Foley follow-up.   02/01/2021: Silodosin and finasteride started at last office visit. Unfortunately due to catheter obstruction he presented to the emergency department 1 day after last office visit for catheter replacement. 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 He has done well since the procedure. Catheter draining appropriately at this time without painful or leaking urgency, gross hematuria. He is tolerating both alpha-blocker  therapy and finasteride well without side effects.  -03/11/21-patient with history of BPH and urinary retention. Has been on silodosin and finasteride but has failed voiding trials. Urodynamics showing obstructive voiding pattern, see report below. Here for cystoscopy to assess bladder and prostate.  Cystoscopy is performed today and shows marked bilobar prostatic hypertrophy with about 4 cm prostatic urethral length. There is some inflammatory response in the trigone of the bladder but no obvious mucosal lesions otherwise.  Urodynamics - [id number], [id number], [id number], [id number], [id number]  URODYNAMICS STUDY   Test Indication: Urinary Retention  The procedure's risks, benefits and infection risk were discussed with the patient.   PRE UROFLOW & CATHETERIZATION  Procedure: Pre Uroflow Study  1Arrived with a 16 fr foley.  A Urodynamic catheter was inserted. Urine dipstick was negative for WBCs and bacteria.   CYSTOMETRY/CYSTOMETROGRAM  The bladder was filled with room temperature water at a rate of less than 50 cc per minute.  Injection of contrast was performed for the cystometrogram.  Max capacity was approx. 489 mls. First sensation occurred at 325 mls. Patient went from first sensation to strong desire with each unstable contraction.   The bladder was unstable.  First unstable contraction occurred at 184 mls.  Max unstable detrusor contraction was 51 cmH20.  He felt an increased urge at the time but was able to inhibit leaking.   LEAK POINT PRESSURE  LPPs were assessed with pt in a seated position.  No leakage was noted with abdominal pressures of 69 cmH20.   PRESSURE FLOW STUDY  He did not generate a voluntary contraction  and void.  Max Capacity was 489 mls.   ELECTROMYOGRAM  Activity was measured by surface electrodes  There was no voiding phase.   FLUOROSCOPY and VCUG  Trabeculation and some elevation of the bladder base was noted. No reflux was noted.  Pt confirmed no Neulasta OnPro Device.    POST PROCEDURE ANTIBIOTICS:  He was advised to watch for s/s of a break through UTI and to call our office or go to the ED if he develops any of them.   UDS SUMMARY  [name] held a max capacity of approx. 489 mls. His 1st sensation was felt at 325 mls. There was positive instability. He did not generate a voluntary contraction and void. Trabeculation and some elevation of the bladder base was noted. No reflux was noted. A 16 french Foley catheter was reinserted before the patient left the clinic.     ALLERGIES: No Allergies    MEDICATIONS: Finasteride 5 mg tablet 1 tablet PO Daily  Silodosin 8 mg capsule 1 capsule PO Daily     GU PSH: Complex cystometrogram, w/ void pressure and urethral pressure profile studies, any technique - 02/25/2021 Complex Uroflow - 02/25/2021 Emg surf Electrd - 02/25/2021 Inject For cystogram - 02/25/2021 Intrabd voidng Press - 02/25/2021 Prostate Needle Biopsy - 2010       PSH Notes: Biopsy Of The Prostate Needle, Cataract Surgery   NON-GU PSH: No Non-GU PSH    GU PMH: Urinary Retention - 02/25/2021, - 02/15/2021, - 02/01/2021, - 01/21/2021 BPH w/LUTS - 01/21/2021, Benign prostatic hyperplasia with urinary obstruction, - 2014 Renal cyst - 01/21/2021 Elevated PSA, Elevated prostate specific antigen (PSA) - 2016 Other microscopic hematuria, Microscopic hematuria - 2016 CIS of prostate/prostatic urethra, 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, unspecified, Erectile dysfunction - 2015 Primary hypogonadism, Hypogonadism, testicular - 2014      PMH Notes:  2009-08-25 09:00:55 - Note: No Medical Problems   NON-GU PMH: Encounter for general adult medical examination without abnormal findings, Encounter for preventive health examination - 2016    FAMILY HISTORY: Family Health Status Number - Runs In Family No pertinent family history - Other   SOCIAL HISTORY: No Social History     Notes: Never A Smoker, Alcohol Use,  Marital History - Currently Married, Occupation:, Tobacco Use, Caffeine Use   REVIEW OF SYSTEMS:    GU Review Male:   Patient denies frequent urination, hard to postpone urination, burning/ pain with urination, get up at night to urinate, leakage of urine, stream starts and stops, trouble starting your stream, have to strain to urinate , erection problems, and penile pain.  Gastrointestinal (Upper):   Patient denies nausea, vomiting, and indigestion/ heartburn.  Gastrointestinal (Lower):   Patient denies diarrhea and constipation.  Constitutional:   Patient denies fever, night sweats, weight loss, and fatigue.  Skin:   Patient denies skin rash/ lesion and itching.  Eyes:   Patient denies blurred vision and double vision.  Ears/ Nose/ Throat:   Patient denies sore throat and sinus problems.  Hematologic/Lymphatic:   Patient denies swollen glands and easy bruising.  Cardiovascular:   Patient denies leg swelling and chest pains.  Respiratory:   Patient denies cough and shortness of breath.  Endocrine:   Patient denies excessive thirst.  Musculoskeletal:   Patient denies back pain and joint pain.  Neurological:   Patient denies headaches and dizziness.  Psychologic:   Patient denies depression and anxiety.   VITAL SIGNS: None   GU PHYSICAL EXAMINATION:  Urethral Meatus: Normal size. No lesion, no wart, no discharge, no polyp. Normal location.  Penis: Penis uncircumcised. No foreskin warts, no cracks. No dorsal peyronie's plaques, no left corporal peyronie's plaques, no right corporal peyronie's plaques, no scarring, no shaft warts. No balanitis, no meatal stenosis.    MULTI-SYSTEM PHYSICAL EXAMINATION:       Complexity of Data:   10/28/14 05/11/14 05/09/13 05/08/12 04/17/11 04/01/10 08/17/09 11/23/08  PSA  Total PSA 9.93  10.14  9.79  8.30  8.48  7.24  7.60  9.39   Free PSA   2.12  1.38  1.70  1.3  0.90  1.00   % Free PSA   22  17  20  18   11.8  10.6     PROCEDURES:         Flexible  Cystoscopy - 52000  Risks, benefits, and some of the potential complications of the procedure were discussed at length with the patient including infection, bleeding, voiding discomfort, urinary retention, fever, chills, sepsis, and others. All questions were answered. Informed consent was obtained. Antibiotic prophylaxis was given. Sterile technique and intraurethral analgesia were used.  Meatus:  Normal size. Normal location. Normal condition.  Urethra:  No strictures.  External Sphincter:  Normal.  Verumontanum:  Normal.  Prostate:  Obstructing. Severe hyperplasia.4cm length  Bladder Neck:  Non-obstructing.  Ureteral Orifices:  Normal location. Normal size. Normal shape. Effluxed clear urine.  Bladder:  Cystoscopy is performed today and shows marked bilobar prostatic hypertrophy with about 4 cm prostatic urethral length. There is some inflammatory response in the trigone of the bladder but no obvious mucosal lesions otherwise. He has trabeculated bladder      The lower urinary tract was carefully examined. The procedure was well-tolerated and without complications. Antibiotic instructions were given. Instructions were given to call the office immediately for bloody urine, difficulty urinating, urinary retention, painful or frequent urination, fever, chills, nausea, vomiting or other illness. The patient stated that he understood these instructions and would comply with them.   ASSESSMENT:      ICD-10 Details  1 GU:   BPH w/LUTS - O11.5 Acute, Complicated Injury  2   Urinary Retention - B26.2 Acute, Complicated Injury   PLAN:           Document Letter(s):  Created for Patient: Clinical Summary         Notes:   Based on cystoscopic findings and urodynamics recommended cystoscopy and TURP. Risks and benefits of the procedure were discussed in detail as outlined below.  I have discussed with the patient the risks, benefits and alternatives of trans urethral resection of prostate which  includes but is not limited to: Bleeding, sometimes requiring transfusion of blood, TUR syndrome, prolonged Foley catheter drainage, infection, urinary incontinence, urinary retention, damage to surrounding organs, need for possible additional procedures, possibility of nonhealing area leading to recurrent hematuria, retrograde ejaculation, erectile dysfunction, and urgency and frequency which can be refractory to medication,. Typically the hospital stay will be overnight with a catheter in place up to a week. The patient's expected recovery. With irritative voiding symptoms and intermittent hematuria for up to a month. The patient voices understanding of the risks and benefits of the TURP procedure and consents to proceed.

## 2021-03-29 ENCOUNTER — Encounter (HOSPITAL_COMMUNITY)
Admission: RE | Disposition: A | Discharge status: Home / Self Care | Payer: Self-pay | Source: Home / Self Care | Attending: Urology

## 2021-03-29 ENCOUNTER — Ambulatory Visit (HOSPITAL_COMMUNITY): Payer: Medicare HMO | Admitting: Anesthesiology

## 2021-03-29 ENCOUNTER — Other Ambulatory Visit: Payer: Self-pay

## 2021-03-29 ENCOUNTER — Ambulatory Visit (HOSPITAL_COMMUNITY)
Admission: RE | Admit: 2021-03-29 | Discharge: 2021-03-30 | Disposition: A | Discharge status: Home / Self Care | Payer: Medicare HMO | Attending: Urology | Admitting: Urology

## 2021-03-29 ENCOUNTER — Encounter (HOSPITAL_COMMUNITY): Payer: Self-pay | Admitting: Urology

## 2021-03-29 DIAGNOSIS — R338 Other retention of urine: Secondary | ICD-10-CM | POA: Diagnosis not present

## 2021-03-29 DIAGNOSIS — N400 Enlarged prostate without lower urinary tract symptoms: Secondary | ICD-10-CM | POA: Diagnosis not present

## 2021-03-29 DIAGNOSIS — N401 Enlarged prostate with lower urinary tract symptoms: Secondary | ICD-10-CM | POA: Insufficient documentation

## 2021-03-29 DIAGNOSIS — N182 Chronic kidney disease, stage 2 (mild): Secondary | ICD-10-CM | POA: Diagnosis not present

## 2021-03-29 DIAGNOSIS — I129 Hypertensive chronic kidney disease with stage 1 through stage 4 chronic kidney disease, or unspecified chronic kidney disease: Secondary | ICD-10-CM | POA: Diagnosis not present

## 2021-03-29 DIAGNOSIS — E1122 Type 2 diabetes mellitus with diabetic chronic kidney disease: Secondary | ICD-10-CM | POA: Diagnosis not present

## 2021-03-29 HISTORY — PX: TRANSURETHRAL RESECTION OF PROSTATE: SHX73

## 2021-03-29 HISTORY — PX: CYSTOSCOPY: SHX5120

## 2021-03-29 LAB — GLUCOSE, CAPILLARY: Glucose-Capillary: 118 mg/dL — ABNORMAL HIGH (ref 70–99)

## 2021-03-29 SURGERY — TURP (TRANSURETHRAL RESECTION OF PROSTATE)
Anesthesia: General | Site: Prostate

## 2021-03-29 MED ORDER — PHENYLEPHRINE HCL (PRESSORS) 10 MG/ML IV SOLN
INTRAVENOUS | Status: AC
  Filled 2021-03-29: qty 1

## 2021-03-29 MED ORDER — PROPOFOL 10 MG/ML IV BOLUS
INTRAVENOUS | Status: AC
  Filled 2021-03-29: qty 20

## 2021-03-29 MED ORDER — SODIUM CHLORIDE 0.9 % IR SOLN
Status: DC | PRN
  Administered 2021-03-29: 1000 mL

## 2021-03-29 MED ORDER — FENTANYL CITRATE (PF) 100 MCG/2ML IJ SOLN
INTRAMUSCULAR | Status: AC
  Filled 2021-03-29: qty 2

## 2021-03-29 MED ORDER — CHLORHEXIDINE GLUCONATE CLOTH 2 % EX PADS
6.0000 | MEDICATED_PAD | Freq: Every day | CUTANEOUS | Status: DC
  Administered 2021-03-30: 6 via TOPICAL

## 2021-03-29 MED ORDER — FENTANYL CITRATE (PF) 100 MCG/2ML IJ SOLN
INTRAMUSCULAR | Status: DC | PRN
  Administered 2021-03-29 (×5): 25 ug via INTRAVENOUS
  Administered 2021-03-29: 50 ug via INTRAVENOUS
  Administered 2021-03-29: 25 ug via INTRAVENOUS

## 2021-03-29 MED ORDER — MAGNESIUM OXIDE -MG SUPPLEMENT 400 (240 MG) MG PO TABS
400.0000 mg | ORAL_TABLET | Freq: Every day | ORAL | Status: DC
  Administered 2021-03-30: 400 mg via ORAL
  Filled 2021-03-29: qty 1

## 2021-03-29 MED ORDER — MAGNESIUM 500 MG PO TABS: 500.0000 mg | ORAL_TABLET | Freq: Every day | ORAL | Status: DC

## 2021-03-29 MED ORDER — SODIUM CHLORIDE 0.9 % IR SOLN
Status: DC | PRN
  Administered 2021-03-29 (×5): 6000 mL

## 2021-03-29 MED ORDER — HYDROCODONE-ACETAMINOPHEN 5-325 MG PO TABS: 1.0000 | ORAL_TABLET | ORAL | Status: DC | PRN

## 2021-03-29 MED ORDER — DEXAMETHASONE SODIUM PHOSPHATE 4 MG/ML IJ SOLN
INTRAMUSCULAR | Status: DC | PRN
  Administered 2021-03-29: 10 mg via INTRAVENOUS

## 2021-03-29 MED ORDER — LIDOCAINE HCL (CARDIAC) PF 100 MG/5ML IV SOSY
PREFILLED_SYRINGE | INTRAVENOUS | Status: DC | PRN
Start: 2021-03-29 — End: 2021-03-29
  Administered 2021-03-29: 100 mg via INTRAVENOUS

## 2021-03-29 MED ORDER — PHENYLEPHRINE 40 MCG/ML (10ML) SYRINGE FOR IV PUSH (FOR BLOOD PRESSURE SUPPORT)
PREFILLED_SYRINGE | INTRAVENOUS | Status: AC
  Filled 2021-03-29: qty 10

## 2021-03-29 MED ORDER — ONDANSETRON HCL 4 MG/2ML IJ SOLN
INTRAMUSCULAR | Status: AC
  Filled 2021-03-29: qty 2

## 2021-03-29 MED ORDER — DEXAMETHASONE SODIUM PHOSPHATE 10 MG/ML IJ SOLN
INTRAMUSCULAR | Status: AC
  Filled 2021-03-29: qty 1

## 2021-03-29 MED ORDER — SODIUM CHLORIDE 0.9 % IV SOLN
INTRAVENOUS | Status: DC
  Administered 2021-03-29: 75 mL/h via INTRAVENOUS

## 2021-03-29 MED ORDER — ACETAMINOPHEN 500 MG PO TABS
1000.0000 mg | ORAL_TABLET | Freq: Once | ORAL | Status: AC
  Administered 2021-03-29: 1000 mg via ORAL
  Filled 2021-03-29: qty 2

## 2021-03-29 MED ORDER — ATORVASTATIN CALCIUM 10 MG PO TABS
10.0000 mg | ORAL_TABLET | Freq: Every day | ORAL | Status: DC
  Filled 2021-03-29: qty 1

## 2021-03-29 MED ORDER — SODIUM CHLORIDE 0.9 % IR SOLN: 3000.0000 mL | Status: DC

## 2021-03-29 MED ORDER — CEFAZOLIN SODIUM-DEXTROSE 2-4 GM/100ML-% IV SOLN
2.0000 g | INTRAVENOUS | Status: AC
  Administered 2021-03-29: 2 g via INTRAVENOUS
  Filled 2021-03-29: qty 100

## 2021-03-29 MED ORDER — ONDANSETRON HCL 4 MG/2ML IJ SOLN: 4.0000 mg | INTRAMUSCULAR | Status: DC | PRN

## 2021-03-29 MED ORDER — ACETAMINOPHEN 325 MG PO TABS: 650.0000 mg | ORAL_TABLET | ORAL | Status: DC | PRN

## 2021-03-29 MED ORDER — FINASTERIDE 5 MG PO TABS
5.0000 mg | ORAL_TABLET | Freq: Every day | ORAL | Status: DC
  Administered 2021-03-30: 5 mg via ORAL
  Filled 2021-03-29: qty 1

## 2021-03-29 MED ORDER — PROPOFOL 10 MG/ML IV BOLUS
INTRAVENOUS | Status: DC | PRN
  Administered 2021-03-29 (×2): 100 mg via INTRAVENOUS

## 2021-03-29 MED ORDER — OXYBUTYNIN CHLORIDE 5 MG PO TABS: 5.0000 mg | ORAL_TABLET | Freq: Three times a day (TID) | ORAL | Status: DC | PRN

## 2021-03-29 MED ORDER — PHENYLEPHRINE HCL-NACL 20-0.9 MG/250ML-% IV SOLN
INTRAVENOUS | Status: DC | PRN
  Administered 2021-03-29: 40 ug/min via INTRAVENOUS

## 2021-03-29 MED ORDER — ONDANSETRON HCL 4 MG/2ML IJ SOLN
INTRAMUSCULAR | Status: DC | PRN
Start: 2021-03-29 — End: 2021-03-29
  Administered 2021-03-29: 4 mg via INTRAVENOUS

## 2021-03-29 MED ORDER — LIDOCAINE 2% (20 MG/ML) 5 ML SYRINGE
INTRAMUSCULAR | Status: AC
  Filled 2021-03-29: qty 5

## 2021-03-29 MED ORDER — LACTATED RINGERS IV SOLN: INTRAVENOUS | Status: DC

## 2021-03-29 MED ORDER — PHENYLEPHRINE HCL (PRESSORS) 10 MG/ML IV SOLN
INTRAVENOUS | Status: DC | PRN
  Administered 2021-03-29 (×3): 80 ug via INTRAVENOUS
  Administered 2021-03-29: 120 ug via INTRAVENOUS
  Administered 2021-03-29: 40 ug via INTRAVENOUS

## 2021-03-29 MED ORDER — FENTANYL CITRATE PF 50 MCG/ML IJ SOSY: 25.0000 ug | PREFILLED_SYRINGE | INTRAMUSCULAR | Status: DC | PRN

## 2021-03-29 MED ORDER — CHLORHEXIDINE GLUCONATE 0.12 % MT SOLN
15.0000 mL | Freq: Once | OROMUCOSAL | Status: AC
  Administered 2021-03-29: 15 mL via OROMUCOSAL

## 2021-03-29 MED ORDER — SUCCINYLCHOLINE CHLORIDE 200 MG/10ML IV SOSY
PREFILLED_SYRINGE | INTRAVENOUS | Status: AC
  Filled 2021-03-29: qty 10

## 2021-03-29 MED ORDER — ORAL CARE MOUTH RINSE: 15.0000 mL | Freq: Once | OROMUCOSAL | Status: AC

## 2021-03-29 MED ORDER — SUCCINYLCHOLINE CHLORIDE 200 MG/10ML IV SOSY
PREFILLED_SYRINGE | INTRAVENOUS | Status: DC | PRN
Start: 2021-03-29 — End: 2021-03-29
  Administered 2021-03-29: 120 mg via INTRAVENOUS

## 2021-03-29 SURGICAL SUPPLY — 23 items
BAG DRN RND TRDRP ANRFLXCHMBR (UROLOGICAL SUPPLIES) ×2
BAG URINE DRAIN 2000ML AR STRL (UROLOGICAL SUPPLIES) ×3 IMPLANT
BAG URO CATCHER STRL LF (MISCELLANEOUS) ×3 IMPLANT
BULB IRRIG PATHFIND (MISCELLANEOUS) IMPLANT
CATH HEMA 3WAY 30CC 22FR COUDE (CATHETERS) ×1 IMPLANT
CATH URETL OPEN END 6FR 70 (CATHETERS) ×2 IMPLANT
CLOTH BEACON ORANGE TIMEOUT ST (SAFETY) ×3 IMPLANT
DRAPE FOOT SWITCH (DRAPES) ×3 IMPLANT
EVACUATOR MICROVAS BLADDER (UROLOGICAL SUPPLIES) ×1 IMPLANT
GLOVE SURG ENC TEXT LTX SZ7.5 (GLOVE) ×3 IMPLANT
GOWN STRL REUS W/TWL LRG LVL3 (GOWN DISPOSABLE) ×6 IMPLANT
GUIDEWIRE STR DUAL SENSOR (WIRE) ×2 IMPLANT
HOLDER FOLEY CATH W/STRAP (MISCELLANEOUS) ×1 IMPLANT
LOOP CUT BIPOLAR 24F LRG (ELECTROSURGICAL) ×1 IMPLANT
MANIFOLD NEPTUNE II (INSTRUMENTS) ×3 IMPLANT
PACK CYSTO (CUSTOM PROCEDURE TRAY) ×3 IMPLANT
PENCIL SMOKE EVACUATOR (MISCELLANEOUS) IMPLANT
SYR 20ML LL LF (SYRINGE) ×3 IMPLANT
SYR 30ML LL (SYRINGE) ×3 IMPLANT
SYR TOOMEY IRRIG 70ML (MISCELLANEOUS) ×3
SYRINGE TOOMEY IRRIG 70ML (MISCELLANEOUS) ×2 IMPLANT
TUBING CONNECTING 10 (TUBING) ×3 IMPLANT
TUBING UROLOGY SET (TUBING) ×3 IMPLANT

## 2021-03-29 NOTE — Anesthesia Postprocedure Evaluation (Signed)
Anesthesia Post Note  Patient: Bryan Morgan  Procedure(s) Performed: TRANSURETHRAL RESECTION OF THE PROSTATE (TURP) (Prostate) CYSTOSCOPY (Bladder)     Patient location during evaluation: PACU Anesthesia Type: General Level of consciousness: awake and alert Pain management: pain level controlled Vital Signs Assessment: post-procedure vital signs reviewed and stable Respiratory status: spontaneous breathing, nonlabored ventilation, respiratory function stable and patient connected to nasal cannula oxygen Cardiovascular status: blood pressure returned to baseline and stable Postop Assessment: no apparent nausea or vomiting Anesthetic complications: no   No notable events documented.  Last Vitals:  Vitals:   03/29/21 1100 03/29/21 1200  BP: (!) 149/97 (!) 150/92  Pulse: 69 82  Resp: 10 11  Temp:    SpO2: 95% 99%    Last Pain:  Vitals:   03/29/21 1100  TempSrc:   PainSc: 0-No pain                 Kanija Remmel L Fiora Weill

## 2021-03-29 NOTE — Anesthesia Procedure Notes (Signed)
Procedure Name: LMA Insertion Date/Time: 03/29/2021 7:43 AM Performed by: Justice Rocher, CRNA Pre-anesthesia Checklist: Patient identified, Emergency Drugs available, Suction available, Patient being monitored and Timeout performed Patient Re-evaluated:Patient Re-evaluated prior to induction Oxygen Delivery Method: Circle system utilized Preoxygenation: Pre-oxygenation with 100% oxygen Induction Type: IV induction Ventilation: Mask ventilation without difficulty LMA: LMA inserted LMA Size: 5.0 Number of attempts: 1 Airway Equipment and Method: Bite block Placement Confirmation: positive ETCO2 Tube secured with: Tape Dental Injury: Teeth and Oropharynx as per pre-operative assessment  Comments: Difficult to seal - Dr Lanetta Inch and Dr Kalman Shan at bedsie assisting - After several attempts to reposition LMA - Decision to intubate

## 2021-03-29 NOTE — Interval H&P Note (Signed)
History and Physical Interval Note:  03/29/2021 7:19 AM  Bryan Morgan  has presented today for surgery, with the diagnosis of BPH, URINARY RETENTION.  The various methods of treatment have been discussed with the patient and family. After consideration of risks, benefits and other options for treatment, the patient has consented to  Procedure(s): TRANSURETHRAL RESECTION OF THE PROSTATE (TURP) (N/A) CYSTOSCOPY (N/A) as a surgical intervention.  The patient's history has been reviewed, patient examined, no change in status, stable for surgery.  I have reviewed the patient's chart and labs.  Questions were answered to the patient's satisfaction.     [name]

## 2021-03-29 NOTE — Anesthesia Procedure Notes (Signed)
Procedure Name: Intubation Date/Time: 03/29/2021 7:47 AM Performed by: Justice Rocher, CRNA Pre-anesthesia Checklist: Patient identified, Emergency Drugs available, Suction available, Patient being monitored and Timeout performed Patient Re-evaluated:Patient Re-evaluated prior to induction Oxygen Delivery Method: Circle system utilized Preoxygenation: Pre-oxygenation with 100% oxygen Induction Type: IV induction Ventilation: Mask ventilation without difficulty Laryngoscope Size: Mac and 4 Grade View: Grade II Tube type: Oral Tube size: 8.0 mm Number of attempts: 1 Airway Equipment and Method: Stylet and Oral airway Placement Confirmation: ETT inserted through vocal cords under direct vision, positive ETCO2, breath sounds checked- equal and bilateral and CO2 detector Secured at: 24 cm Tube secured with: Tape Dental Injury: Teeth and Oropharynx as per pre-operative assessment

## 2021-03-29 NOTE — Transfer of Care (Signed)
Immediate Anesthesia Transfer of Care Note  Patient: Bryan Morgan  Procedure(s) Performed: Procedure(s) (LRB): TRANSURETHRAL RESECTION OF THE PROSTATE (TURP) (N/A) CYSTOSCOPY (N/A)  Patient Location: PACU  Anesthesia Type: General  Level of Consciousness: awake, sedated, patient cooperative and responds to stimulation  Airway & Oxygen Therapy: Patient Spontanous Breathing and Patient connected to face mask oxygen  Post-op Assessment: Report given to PACU RN, Post -op Vital signs reviewed and stable and Patient moving all extremities  Post vital signs: Reviewed and stable  Complications: No apparent anesthesia complications

## 2021-03-29 NOTE — Op Note (Signed)
Preoperative diagnosis:  1.  BPH with urinary retention  Postoperative diagnosis: 1.  BPH with urinary retention  Procedure(s): 1.  Cystoscopy, transurethral section of the prostate  Surgeon: Dr. Harold Barban  Anesthesia: General  Complications: None  EBL: 50 cc  Specimens: Prostate chips  Disposition of specimens: To pathology  Intraoperative findings: Trilobar prostatic hypertrophy with approximate 3-1/2 to 4 cm prostatic urethral length.  TURP performed.  45 French three-way Foley to light CBI clear at termination of the case  Indication: Patient is a 81 year old white male with history of BPH and recent urinary retention which is failed medical therapy.  Urodynamic showed obstructive voiding pattern.  Presents at this time undergo cystoscopy and TURP.  Description of procedure:  After obtaining informed consent for the patient was taken the major cystoscopy suite placed under general anesthesia.  He is placed in the dorsolithotomy position genitalia prepped draped usual sterile fashion.  Proper pause and timeout was performed.  20 [place] the scope was advanced under direct vision into the bladder.  Prostate was noted to be markedly enlarged with trilobar prostatic hypertrophy and approximate 3 and half to 4 cm prostatic urethral length.  Bladder appeared grossly normal ureteral orifice ease were well away from the bladder neck.  The cystoscope was removed and the direct visualized obturator for the resectoscope was placed into the bladder without difficulty.  Utilizing the bipolar resectoscope anterior commissure was released.  The lateral lobes were then resected from the bladder neck to the level just proximal to the verumontanum from the 7:00 to 11 o'clock position and from the 5:00 to 1 o'clock position creating a open channel.  There was significant median lobe hypertrophy significant vascularity.  The median lobe was subsequently resected down to the level of the bladder neck  proper taking care not to undermine the bladder neck.  Prostatic chips were then irrigated from the bladder.  Second look in the bladder revealed no remaining chips.  Resection was well away from the ureteral orifices.  Hemostasis was obtained with the cautery loop.  44 French three-way Foley was placed after removal of the resectoscope.  This was placed to CBI irrigation and the effluent was clear at termination of the case on moderate CBI drip.  Procedure was terminated he was awakened from anesthesia and taken back to the recovery room in stable condition.  No immediate complication from the procedure.

## 2021-03-30 ENCOUNTER — Encounter (HOSPITAL_COMMUNITY): Payer: Self-pay | Admitting: Urology

## 2021-03-30 DIAGNOSIS — R338 Other retention of urine: Secondary | ICD-10-CM | POA: Diagnosis not present

## 2021-03-30 DIAGNOSIS — N401 Enlarged prostate with lower urinary tract symptoms: Secondary | ICD-10-CM | POA: Diagnosis not present

## 2021-03-30 DIAGNOSIS — N400 Enlarged prostate without lower urinary tract symptoms: Secondary | ICD-10-CM | POA: Diagnosis not present

## 2021-03-30 LAB — SURGICAL PATHOLOGY

## 2021-03-30 NOTE — Progress Notes (Signed)
Pt given and explained discharge instructions. Pt verbalized he understood how to care for his foley catheter and how to empty it properly. IV removed. Foley catheter changed to a leg bag per pt request. Extra leg bag and large drainage bag given to pt for home use. [place] transport called for the patient. Pt dressed in personal clothing and taken to main entrance via wheelchair.

## 2021-03-30 NOTE — Progress Notes (Signed)
1 Day Post-Op Subjective: Feels well this morning.  CBI essentially off and urine remains light pink to clear.  No significant pain status post TURP  Objective: Vital signs in last 24 hours: Temp:  [97.5 F (36.4 C)-98.4 F (36.9 C)] 98.2 F (36.8 C) (01/04 0440) Pulse Rate:  [69-100] 76 (01/04 0440) Resp:  [10-22] 18 (01/04 0440) BP: (132-178)/(87-98) 138/88 (01/04 0440) SpO2:  [94 %-100 %] 96 % (01/04 0440)  Intake/Output from previous day: 01/03 0701 - 01/04 0700 In: 7421.9 [I.V.:2021.9; IV Piggyback:100] Out: 31438 [OILNZ:97282; Blood:50] Intake/Output this shift: Total I/O In: 1048.8 [I.V.:1048.8] Out: 2200 [Urine:2200]  Physical Exam:  General: Alert and oriented GU: Foley in place draining pink-tinged urine  Lab Results: No results for input(s): HGB, HCT in the last 72 hours. BMET No results for input(s): NA, K, CL, CO2, GLUCOSE, BUN, CREATININE, CALCIUM in the last 72 hours.   Studies/Results: No results found.  Assessment/Plan: Status post TURP, doing well. Plan/recommendation: We will DC CBI placed plug and irrigation port of the Foley.  Assuming urine stays clear over the next hour or so will DC home with Foley catheter with plan to return in 1 week for Foley removal and voiding trial.    LOS: 0 days   Bryan Morgan 03/30/2021, 6:41 AM

## 2021-03-30 NOTE — Discharge Summary (Signed)
°  Date of admission: 03/29/2021  Date of discharge: 03/30/2021  Admission diagnosis: 1.  BPH with urinary retention  Discharge diagnosis: 1.  BPH with urinary retention    History and Physical: For full details, please see admission history and physical. Briefly, Bryan Morgan is a 81 y.o. year old patient with history of BPH and recently has had urinary retention which is not responded to medical therapy.  Presents at this time undergo cystoscopy and TURP.Marland Kitchen   Hospital Course: Patient was admitted on 03/29/2021 after having undergone cystoscopy and transurethral section of the prostate.  For details procedure please see the typed operative note.  Patient's postoperative course was unremarkable.  Continuous bladder irrigation was weaned off over the first postoperative night and urine remained very light pink to clear.  Patient was felt ready for discharge home as he was tolerating regular diet and ambulatory and urine as above noted.  Plan will be for discharge home with Foley catheter with plans to return in 1 week for Foley removal and voiding trial.  Patient has our telephone number and knows to call should problems arise relative to his management in the interim.  Laboratory values: No results for input(s): HGB, HCT in the last 72 hours. No results for input(s): CREATININE in the last 72 hours.  Disposition: Home  Discharge instruction: The patient was instructed to be ambulatory but told to refrain from heavy lifting, strenuous activity, or driving.   Discharge medications: Patient will be discharged home on routine preoperative medications but holding aspirin at this time.   Followup:   1 week for foley removal

## 2021-04-15 ENCOUNTER — Encounter: Payer: Self-pay | Admitting: Internal Medicine

## 2021-04-26 ENCOUNTER — Encounter: Payer: Medicare HMO | Admitting: Internal Medicine

## 2021-05-03 DIAGNOSIS — R338 Other retention of urine: Secondary | ICD-10-CM | POA: Diagnosis not present

## 2021-05-18 ENCOUNTER — Telehealth: Payer: Self-pay | Admitting: Internal Medicine

## 2021-05-18 NOTE — Chronic Care Management (AMB) (Signed)
°  Chronic Care Management   Note  05/18/2021 Name: Bryan Morgan MRN: [id number] DOB: 03/31/1940  Bryan Morgan is a 81 y.o. year old male who is a primary care patient of Unk Pinto, MD. I reached out to Durward Mallard by phone today in response to a referral sent by [name]'s PCP, Unk Pinto, MD.   [name] was given information about Chronic Care Management services today including:  CCM service includes personalized support from designated clinical staff supervised by his physician, including individualized plan of care and coordination with other care providers 24/7 contact phone numbers for assistance for urgent and routine care needs. Service will only be billed when office clinical staff spend 20 minutes or more in a month to coordinate care. Only one practitioner may furnish and bill the service in a calendar month. The patient may stop CCM services at any time (effective at the end of the month) by phone call to the office staff.   Patient wishes to consider information provided and/or speak with a member of the care team before deciding about enrollment in care management services.   Follow up plan: PER PT WIFE PT WOULD NOT BENEFIT THIS HAS NO CC OR MEDICINE HE TAKES   Tatjana Dellinger [employer] Scheduler

## 2021-06-06 ENCOUNTER — Encounter: Payer: Medicare HMO | Admitting: Adult Health

## 2021-06-13 ENCOUNTER — Encounter: Payer: Medicare HMO | Admitting: Internal Medicine

## 2021-06-16 ENCOUNTER — Encounter: Payer: Self-pay | Admitting: Internal Medicine

## 2021-06-16 NOTE — Patient Instructions (Signed)

## 2021-06-16 NOTE — Progress Notes (Signed)
? ? ? ?Annual  Screening/Preventative Visit  ?& Comprehensive Evaluation & Examination ? ?Future Appointments  ?Date Time Provider Department  ?06/17/2021                  CPE 10:00 AM Unk Pinto, MD [facility]  ?10/03/2021                  Wellness  9:30 AM Liane Comber, NP [facility]  ?06/23/2022                   CPE 10:00 AM Unk Pinto, MD [facility]  ? ?    ?     This very nice 81 y.o. MBM  w Belva Agee & legal blindness since birth presents for a Screening /Preventative Visit & comprehensive evaluation and management of multiple medical co-morbidities.  Patient has been followed for HTN, HLD, T2_NIDDM and Vitamin D Deficiency. Patient has  GERD  controlled with diet & his meds. ? ? ?    Labile HTN predates since May 2020. Patient's BP has been controlled at home.  Today's BP: 130/78. Patient denies any cardiac symptoms as chest pain, palpitations, shortness of breath, dizziness or ankle swelling. ? ? ?    Patient's hyperlipidemia is controlled with diet and medications. Patient denies myalgias or other medication SE's. Last lipids were not at goal : ? ?Lab Results  ?Component Value Date  ? CHOL 195 10/01/2020  ? HDL 38 (L) 10/01/2020  ? LDLCALC 125 (H) 10/01/2020  ? TRIG 202 (H) 10/01/2020  ? CHOLHDL 5.1 (H) 10/01/2020  ? ? ? ?    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   In Nov 2022, patient had endovascular stent repair of an  Abdominal Aortic Aneurysm by Dr Jamelle Haring. Patient denies reactive hypoglycemic symptoms, visual blurring, diabetic polys or paresthesias. Last A1c was not at goal : ?  ?Lab Results  ?Component Value Date  ? HGBA1C 6.7 (H) 03/25/2021  ?  ? ?    Finally, patient has history of Vitamin D Deficiency ("13"/May 2020) and last vitamin D was  still not at goal : ?  ?Lab Results  ?Component Value Date  ? VD25OH 35 06/04/2020  ? ? ? ?Current Outpatient Medications on File Prior to Visit  ?Medication Sig  ? (Patient not  taking: Reported on 03/18/2021)  ? VITAMIN D 5000 u Takes daily.  ? finasteride 5 MG tablet Takes  daily.  ? Magnesium 500 MG TABS Takes  daily.  ? ? ? ?Past Medical History:  ?Diagnosis Date  ? AAA (abdominal aortic aneurysm)   ? BPH (benign prostatic hyperplasia)   ? with retention, s/p foley catheter placement in [place] [facility] 12/29/20  ? History of kidney stones   ? History of needle biopsy of prostate with negative result   ? Legal blindness   ? Positive colorectal cancer screening using Cologuard test 08/20/2018  ? 08/18/2018 - GI referral placed; tubular & tubulovillous adenomas 09/12/18 colonoscopy, 3 year f/u rec  ? Pre-diabetes 06/04/2020  ? ? ? ?Health Maintenance  ?Topic Date Due  ? OPHTHALMOLOGY EXAM  Never done  ? TETANUS/TDAP  Never done  ? Zoster Vaccines- Shingrix (1 of 2) Never done  ? Pneumonia Vaccine 61+ Years old (1 - PCV) Never done  ? COVID-19 Vaccine (3 - Booster for Janssen series) 05/28/2020  ? INFLUENZA VACCINE  Never done  ? FOOT EXAM  06/03/2021  ? URINE MICROALBUMIN  06/04/2021  ? HEMOGLOBIN A1C  09/23/2021  ? HPV VACCINES  Aged Out  ? ? ? ?Immunization History  ?Administered Date(s) Administered  ? Janssen (J&J) SARS-COV-2 Vaccination 08/16/2019, 04/02/2020  ? ?Positive Cologuard test 08/20/2018   ? ?Last Colon - 09/12/2018 - Dr Hilarie Fredrickson Recc 3 year f/u due June 2023 ? ?Past Surgical History:  ?Procedure Laterality Date  ? ABDOMINAL AORTIC ENDOVASCULAR STENT GRAFT N/A 01/27/2021  ? Procedure: ABDOMINAL AORTIC ENDOVASCULAR STENT GRAFT REPAIR;  Surgeon: Cherre Robins, MD;  Location: [facility];  Service: Vascular;  Laterality: N/A;  ? CATARACT EXTRACTION, BILATERAL  2007  ? CYSTOSCOPY N/A 03/29/2021  ? Procedure: CYSTOSCOPY;  Surgeon: [name], MD;  Location: [facility];  Service: Urology;  Laterality: N/A;  ? TRANSURETHRAL RESECTION OF PROSTATE N/A 03/29/2021  ? Procedure: TRANSURETHRAL RESECTION OF THE PROSTATE (TURP);  Surgeon: [name], MD;  Location: [facility];  Service: Urology;   Laterality: N/A;  ? ULTRASOUND GUIDANCE FOR VASCULAR ACCESS Bilateral 01/27/2021  ? Procedure: ULTRASOUND GUIDANCE FOR VASCULAR ACCESS, BILATERAL FEMORAL ARTERIES;  Surgeon: Cherre Robins, MD;  Location: [facility];  Service: Vascular;  Laterality: Bilateral;  ? ? ? ?Family History  ?Problem Relation Age of Onset  ? Diabetes Sister   ? COPD Brother   ? Diabetes Sister   ? Colon cancer Neg Hx   ? Esophageal cancer Neg Hx   ? Stomach cancer Neg Hx   ? Pancreatic cancer Neg Hx   ? Liver disease Neg Hx   ? ? ? ?Social History  ? ?Tobacco Use  ? Smoking status: Never  ? Smokeless tobacco: Never  ?Vaping Use  ? Vaping Use: Never used  ?Substance Use Topics  ? Alcohol use: No  ?  Comment: rare, 1 drink twice a year  ? Drug use: No  ? ? ? ? ROS ?Constitutional: Denies fever, chills, weight loss/gain, headaches, insomnia,  night sweats or change in appetite. Does c/o fatigue. ?Eyes: Denies redness, blurred vision, diplopia, discharge, itchy or watery eyes.  ?ENT: Denies discharge, congestion, post nasal drip, epistaxis, sore throat, earache, hearing loss, dental pain, Tinnitus, Vertigo, Sinus pain or snoring.  ?Cardio: Denies chest pain, palpitations, irregular heartbeat, syncope, dyspnea, diaphoresis, orthopnea, PND, claudication or edema ?Respiratory: denies cough, dyspnea, DOE, pleurisy, hoarseness, laryngitis or wheezing.  ?Gastrointestinal: Denies dysphagia, heartburn, reflux, water brash, pain, cramps, nausea, vomiting, bloating, diarrhea, constipation, hematemesis, melena, hematochezia, jaundice or hemorrhoids ?Genitourinary: Denies dysuria, frequency, discharge, hematuria or flank pain. Has urgency, nocturia x 2-3 & occasional hesitancy. ?Musculoskeletal: Denies arthralgia, myalgia, stiffness, Jt. Swelling, pain, limp or strain/sprain. Denies Falls. ?Skin: Denies puritis, rash, hives, warts, acne, eczema or change in skin lesion ?Neuro: No weakness, tremor, incoordination, spasms, paresthesia or pain ?Psychiatric:  Denies confusion, memory loss or sensory loss. Denies Depression. ?Endocrine: Denies change in weight, skin, hair change, nocturia, and paresthesia, diabetic polys, visual blurring or hyper / hypo glycemic episodes.  ?Heme/Lymph: No excessive bleeding, bruising or enlarged lymph nodes. ? ? ?Physical Exam ? ?BP 130/78   Pulse (!) 43   Temp (!) 97.5 ?F (36.4 ?C)   Ht 6' 1.5" (1.867 m)   Wt 221 lb 9.6 oz (100.5 kg)   BMI 28.84 kg/m?  ? ?General Appearance: Well nourished and well groomed and in no apparent distress. ? ?Eyes: Sl anisocoria w/o tonic light reflex and Rt pupil  elliptical. 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 Vision decreased to  forms w/o details. ?Sinuses: No frontal/maxillary tenderness ?ENT/Mouth: EACs patent / TMs  nl. Nares clear without erythema, swelling, mucoid exudates. Oral hygiene is good. No erythema, swelling, or exudate. Tongue normal, non-obstructing. Tonsils not swollen or erythematous. Hearing normal.  ?Neck: Supple, thyroid not palpable. No bruits, nodes or JVD. ?Respiratory: Respiratory effort normal.  BS equal and clear bilateral without rales, rhonci, wheezing or stridor. ?Cardio: Heart sounds are normal with regular rate and rhythm and no murmurs, rubs or gallops. Peripheral pulses are normal and equal bilaterally without edema. No aortic or femoral bruits. ?Chest: symmetric with normal excursions and percussion.  ?Abdomen: Soft, with Nl bowel sounds. Nontender, no guarding, rebound, hernias, masses, or organomegaly.  ?Lymphatics: Non tender without lymphadenopathy.  ?Musculoskeletal: Full ROM all peripheral extremities, joint stability, 5/5 strength, and normal gait. ?Skin: Warm and dry without rashes, lesions, cyanosis, clubbing or  ecchymosis.  ?Neuro: Cranial nerves intact, reflexes equal bilaterally. Normal muscle tone, no cerebellar symptoms. Sensation intact.  ?Pysch: Alert and oriented X 3 with  normal affect, insight and judgment appropriate.  ? ?Assessment and Plan ? ?1. Annual Preventative/Screening Exam  ? ? ?2. Legal blindness ? ? ?3. Labile hypertension ? ?- EKG 12-Lead ?- Urinalysis, Routine w reflex micr

## 2021-06-17 ENCOUNTER — Other Ambulatory Visit: Payer: Self-pay

## 2021-06-17 ENCOUNTER — Ambulatory Visit (INDEPENDENT_AMBULATORY_CARE_PROVIDER_SITE_OTHER): Payer: Medicare HMO | Admitting: Internal Medicine

## 2021-06-17 VITALS — BP 130/78 | HR 43 | Temp 97.5°F | Ht 73.5 in | Wt 221.6 lb

## 2021-06-17 DIAGNOSIS — I714 Abdominal aortic aneurysm, without rupture, unspecified: Secondary | ICD-10-CM

## 2021-06-17 DIAGNOSIS — Z136 Encounter for screening for cardiovascular disorders: Secondary | ICD-10-CM | POA: Diagnosis not present

## 2021-06-17 DIAGNOSIS — N138 Other obstructive and reflux uropathy: Secondary | ICD-10-CM | POA: Diagnosis not present

## 2021-06-17 DIAGNOSIS — I700 Atherosclerosis of aorta: Secondary | ICD-10-CM | POA: Diagnosis not present

## 2021-06-17 DIAGNOSIS — E559 Vitamin D deficiency, unspecified: Secondary | ICD-10-CM

## 2021-06-17 DIAGNOSIS — N182 Chronic kidney disease, stage 2 (mild): Secondary | ICD-10-CM | POA: Diagnosis not present

## 2021-06-17 DIAGNOSIS — R0989 Other specified symptoms and signs involving the circulatory and respiratory systems: Secondary | ICD-10-CM

## 2021-06-17 DIAGNOSIS — E1169 Type 2 diabetes mellitus with other specified complication: Secondary | ICD-10-CM | POA: Diagnosis not present

## 2021-06-17 DIAGNOSIS — K219 Gastro-esophageal reflux disease without esophagitis: Secondary | ICD-10-CM

## 2021-06-17 DIAGNOSIS — Z125 Encounter for screening for malignant neoplasm of prostate: Secondary | ICD-10-CM

## 2021-06-17 DIAGNOSIS — N400 Enlarged prostate without lower urinary tract symptoms: Secondary | ICD-10-CM | POA: Diagnosis not present

## 2021-06-17 DIAGNOSIS — Z0001 Encounter for general adult medical examination with abnormal findings: Secondary | ICD-10-CM

## 2021-06-17 DIAGNOSIS — E039 Hypothyroidism, unspecified: Secondary | ICD-10-CM | POA: Diagnosis not present

## 2021-06-17 DIAGNOSIS — E785 Hyperlipidemia, unspecified: Secondary | ICD-10-CM | POA: Diagnosis not present

## 2021-06-17 DIAGNOSIS — I7143 Infrarenal abdominal aortic aneurysm, without rupture: Secondary | ICD-10-CM

## 2021-06-17 DIAGNOSIS — Z79899 Other long term (current) drug therapy: Secondary | ICD-10-CM

## 2021-06-17 DIAGNOSIS — Z0000 Encounter for general adult medical examination without abnormal findings: Secondary | ICD-10-CM

## 2021-06-17 DIAGNOSIS — Z8249 Family history of ischemic heart disease and other diseases of the circulatory system: Secondary | ICD-10-CM

## 2021-06-17 DIAGNOSIS — N401 Enlarged prostate with lower urinary tract symptoms: Secondary | ICD-10-CM | POA: Diagnosis not present

## 2021-06-17 DIAGNOSIS — E1122 Type 2 diabetes mellitus with diabetic chronic kidney disease: Secondary | ICD-10-CM | POA: Diagnosis not present

## 2021-06-17 DIAGNOSIS — H548 Legal blindness, as defined in USA: Secondary | ICD-10-CM

## 2021-06-18 ENCOUNTER — Other Ambulatory Visit: Payer: Self-pay | Admitting: Internal Medicine

## 2021-06-18 DIAGNOSIS — N3000 Acute cystitis without hematuria: Secondary | ICD-10-CM

## 2021-06-18 MED ORDER — ROSUVASTATIN CALCIUM 10 MG PO TABS: ORAL_TABLET | ORAL | 3 refills | Status: DC

## 2021-06-18 NOTE — Progress Notes (Signed)
<><><><><><><><><><><><><><><><><><><><><><><><><><><><><><><><><> ?<><><><><><><><><><><><><><><><><><><><><><><><><><><><><><><><><> ? ?-    U/A is suspicious for UTI - Will request lab to do a culture  ?<><><><><><><><><><><><><><><><><><><><><><><><><><><><><><><><><> ?<><><><><><><><><><><><><><><><><><><><><><><><><><><><><><><><><> ? ?-  PSA still elevated, but it is about 1/2 of what it was the last 2 years  ? ?<><><><><><><><><><><><><><><><><><><><><><><><><><><><><><><><><> ?<><><><><><><><><><><><><><><><><><><><><><><><><><><><><><><><><> ? ?-  CBC is ON - Normal - No anemia  & WBC is Normal with No  sign of infection ?<><><><><><><><><><><><><><><><><><><><><><><><><><><><><><><><><> ?<><><><><><><><><><><><><><><><><><><><><><><><><><><><><><><><><> ? ?-  Total Chol = 194 - only slightly elevated,  ?            ( Ideal or Goal is less than 180  )  ? ?but the Bad LDL Chol  = 129 is very elevated  ?            (  Ideal or Goal is less than 70  !  )  ? ? ?- So   Recommend a stricter low cholesterol diet  ? ?- Cholesterol only comes from animal sources  ?- ie. meat, dairy, egg yolks ? ?- Eat all the vegetables you want. ? ?- Avoid meat, especially red meat - Beef AND Pork . ? ?- Avoid cheese & dairy - milk & ice cream.    ? ?- Cheese is the most concentrated form of trans-fats which  ?is the worst thing to clog up our arteries.  ? ?- Veggie cheese is OK which can be found in the fresh  ?produce section at [employer] or [employer] or [employer] \ ? ?<><><><><><><><><><><><><><><><><><><><><><><><><><><><><><><><><> ? ?- Also, Since you had stopped your Atorvastatin  ( Lipitor ),  ? ?- A   [facility] a different med for Chol called Rosuvastatin ( or Crestor )  ?<><><><><><><><><><><><><><><><><><><><><><><><><><><><><><><><><> ?<><><><><><><><><><><><><><><><><><><><><><><><><><><><><><><><><> ? ?-  A1c is still elevated & very close to the point of needing to start treatment   ? ?                                                                                       - either pills  or shots of insulin  ?<><><><><><><><><><><><><><><><><><><><><><><><><><><><><><><><><> ?<><><><><><><><><><><><><><><><><><><><><><><><><><><><><><><><><> ? ?-  So you need to work harder on diet & weight loss ! ? ? ?- Avoid Sweets, Candy & White Stuff  ? ?- White Rice, White [place], White Flour  - Breads &  Pasta ?<><><><><><><><><><><><><><><><><><><><><><><><><><><><><><><><><> ?<><><><><><><><><><><><><><><><><><><><><><><><><><><><><><><><><> ? ?-  All Else - CBC - Kidneys - Electrolytes - Liver - Magnesium & Thyroid   ? ?- all  Normal / OK ?<><><><><><><><><><><><><><><><><><><><><><><><><><><><><><><><><> ?<><><><><><><><><><><><><><><><><><><><><><><><><><><><><><><><><> ? ? ? ? ? ? ? ? ? ? ? ? ? ? ? ? ? ? ? ? ? ? ? ? ? ? ?

## 2021-06-20 ENCOUNTER — Other Ambulatory Visit: Payer: Self-pay | Admitting: Internal Medicine

## 2021-06-20 LAB — MICROALBUMIN / CREATININE URINE RATIO
Creatinine, Urine: 49 mg/dL (ref 20–320)
Microalb Creat Ratio: 186 mcg/mg creat — ABNORMAL HIGH (ref <30)
Microalb, Ur: 9.1 mg/dL

## 2021-06-20 LAB — URINALYSIS, ROUTINE W REFLEX MICROSCOPIC
Bilirubin Urine: NEGATIVE
Glucose, UA: NEGATIVE
Ketones, ur: NEGATIVE
Nitrite: NEGATIVE
Specific Gravity, Urine: 1.009 (ref 1.001–1.035)
Squamous Epithelial / HPF: NONE SEEN /HPF (ref <=5)
WBC, UA: >=60 /HPF — AB (ref 0–5)
pH: 7.5 (ref 5.0–8.0)

## 2021-06-20 LAB — CBC WITH DIFFERENTIAL/PLATELET
Absolute Monocytes: 374 cells/uL (ref 200–950)
Basophils Absolute: 20 cells/uL (ref 0–200)
Basophils Relative: 0.5 %
Eosinophils Absolute: 70 cells/uL (ref 15–500)
Eosinophils Relative: 1.8 %
HCT: 39.9 % (ref 38.5–50.0)
Hemoglobin: 13 g/dL — ABNORMAL LOW (ref 13.2–17.1)
Lymphs Abs: 1439 cells/uL (ref 850–3900)
MCH: 29 pg (ref 27.0–33.0)
MCHC: 32.6 g/dL (ref 32.0–36.0)
MCV: 88.9 fL (ref 80.0–100.0)
MPV: 10.6 fL (ref 7.5–12.5)
Monocytes Relative: 9.6 %
Neutro Abs: 1997 cells/uL (ref 1500–7800)
Neutrophils Relative %: 51.2 %
Platelets: 249 10*3/uL (ref 140–400)
RBC: 4.49 10*6/uL (ref 4.20–5.80)
RDW: 13.5 % (ref 11.0–15.0)
Total Lymphocyte: 36.9 %
WBC: 3.9 10*3/uL (ref 3.8–10.8)

## 2021-06-20 LAB — LIPID PANEL
Cholesterol: 194 mg/dL (ref <200)
HDL: 37 mg/dL — ABNORMAL LOW (ref >=40)
LDL Cholesterol (Calc): 129 mg/dL (calc) — ABNORMAL HIGH
Non-HDL Cholesterol (Calc): 157 mg/dL (calc) — ABNORMAL HIGH (ref <130)
Total CHOL/HDL Ratio: 5.2 (calc) — ABNORMAL HIGH (ref <5.0)
Triglycerides: 160 mg/dL — ABNORMAL HIGH (ref <150)

## 2021-06-20 LAB — COMPLETE METABOLIC PANEL WITH GFR
AG Ratio: 1.3 (calc) (ref 1.0–2.5)
ALT: 13 U/L (ref 9–46)
AST: 14 U/L (ref 10–35)
Albumin: 4 g/dL (ref 3.6–5.1)
Alkaline phosphatase (APISO): 50 U/L (ref 35–144)
BUN: 15 mg/dL (ref 7–25)
CO2: 32 mmol/L (ref 20–32)
Calcium: 10.5 mg/dL — ABNORMAL HIGH (ref 8.6–10.3)
Chloride: 101 mmol/L (ref 98–110)
Creat: 1.2 mg/dL (ref 0.70–1.22)
Globulin: 3.1 g/dL (calc) (ref 1.9–3.7)
Glucose, Bld: 102 mg/dL — ABNORMAL HIGH (ref 65–99)
Potassium: 4.3 mmol/L (ref 3.5–5.3)
Sodium: 139 mmol/L (ref 135–146)
Total Bilirubin: 0.5 mg/dL (ref 0.2–1.2)
Total Protein: 7.1 g/dL (ref 6.1–8.1)
eGFR: 61 mL/min/{1.73_m2} (ref >=60)

## 2021-06-20 LAB — PSA: PSA: 6.39 ng/mL — ABNORMAL HIGH (ref <=4.00)

## 2021-06-20 LAB — INSULIN, RANDOM: Insulin: 22.1 u[IU]/mL — ABNORMAL HIGH

## 2021-06-20 LAB — MICROSCOPIC MESSAGE

## 2021-06-20 LAB — TSH: TSH: 3.38 mIU/L (ref 0.40–4.50)

## 2021-06-20 LAB — HEMOGLOBIN A1C
Hgb A1c MFr Bld: 6.8 % of total Hgb — ABNORMAL HIGH (ref <5.7)
Mean Plasma Glucose: 148 mg/dL
eAG (mmol/L): 8.2 mmol/L

## 2021-06-20 LAB — MAGNESIUM: Magnesium: 2.1 mg/dL (ref 1.5–2.5)

## 2021-06-20 MED ORDER — SULFAMETHOXAZOLE-TRIMETHOPRIM 800-160 MG PO TABS
ORAL_TABLET | ORAL | 0 refills | Status: DC
Start: 2021-06-20 — End: 2022-05-23

## 2021-06-20 NOTE — Progress Notes (Signed)
U/A shows Prostate Infection  ? ?Sent in new Rx for septra 2 x /day for 3 weeks  ?AND ? ?Please schedule a NV for 1 month to recheck urine

## 2021-09-08 ENCOUNTER — Other Ambulatory Visit: Payer: Self-pay

## 2021-09-08 DIAGNOSIS — I7143 Infrarenal abdominal aortic aneurysm, without rupture: Secondary | ICD-10-CM

## 2021-09-16 ENCOUNTER — Ambulatory Visit (HOSPITAL_COMMUNITY)
Admission: RE | Admit: 2021-09-16 | Discharge: 2021-09-16 | Disposition: A | Discharge status: Home / Self Care | Payer: PPO | Source: Ambulatory Visit | Attending: Vascular Surgery | Admitting: Vascular Surgery

## 2021-09-16 DIAGNOSIS — I7143 Infrarenal abdominal aortic aneurysm, without rupture: Secondary | ICD-10-CM | POA: Insufficient documentation

## 2021-09-16 LAB — POCT I-STAT CREATININE: Creatinine, Ser: 1.5 mg/dL — ABNORMAL HIGH (ref 0.61–1.24)

## 2021-09-16 MED ORDER — IOHEXOL 350 MG/ML SOLN
100.0000 mL | Freq: Once | INTRAVENOUS | Status: AC | PRN
  Administered 2021-09-16: 100 mL via INTRAVENOUS

## 2021-09-20 ENCOUNTER — Ambulatory Visit: Payer: PPO | Admitting: Vascular Surgery

## 2021-09-20 ENCOUNTER — Encounter: Payer: Self-pay | Admitting: Vascular Surgery

## 2021-09-20 VITALS — BP 134/78 | HR 80 | Temp 98.4°F | Resp 20 | Ht 73.5 in | Wt 221.0 lb

## 2021-09-20 DIAGNOSIS — Z9889 Other specified postprocedural states: Secondary | ICD-10-CM | POA: Diagnosis not present

## 2021-09-20 DIAGNOSIS — Z8679 Personal history of other diseases of the circulatory system: Secondary | ICD-10-CM

## 2021-09-28 ENCOUNTER — Other Ambulatory Visit: Payer: Self-pay

## 2021-09-28 DIAGNOSIS — Z8679 Personal history of other diseases of the circulatory system: Secondary | ICD-10-CM

## 2021-10-03 ENCOUNTER — Encounter: Payer: Self-pay | Admitting: Nurse Practitioner

## 2021-10-03 ENCOUNTER — Ambulatory Visit: Payer: Medicare HMO | Admitting: Adult Health

## 2021-10-03 ENCOUNTER — Ambulatory Visit (INDEPENDENT_AMBULATORY_CARE_PROVIDER_SITE_OTHER): Payer: PPO | Admitting: Nurse Practitioner

## 2021-10-03 VITALS — BP 138/72 | HR 56 | Temp 97.7°F | Ht 73.5 in | Wt 223.0 lb

## 2021-10-03 DIAGNOSIS — I10 Essential (primary) hypertension: Secondary | ICD-10-CM

## 2021-10-03 DIAGNOSIS — E559 Vitamin D deficiency, unspecified: Secondary | ICD-10-CM

## 2021-10-03 DIAGNOSIS — E1122 Type 2 diabetes mellitus with diabetic chronic kidney disease: Secondary | ICD-10-CM

## 2021-10-03 DIAGNOSIS — K219 Gastro-esophageal reflux disease without esophagitis: Secondary | ICD-10-CM

## 2021-10-03 DIAGNOSIS — E663 Overweight: Secondary | ICD-10-CM

## 2021-10-03 DIAGNOSIS — E119 Type 2 diabetes mellitus without complications: Secondary | ICD-10-CM

## 2021-10-03 DIAGNOSIS — N401 Enlarged prostate with lower urinary tract symptoms: Secondary | ICD-10-CM

## 2021-10-03 DIAGNOSIS — H6983 Other specified disorders of Eustachian tube, bilateral: Secondary | ICD-10-CM

## 2021-10-03 DIAGNOSIS — N400 Enlarged prostate without lower urinary tract symptoms: Secondary | ICD-10-CM

## 2021-10-03 DIAGNOSIS — E1169 Type 2 diabetes mellitus with other specified complication: Secondary | ICD-10-CM

## 2021-10-03 DIAGNOSIS — Z0000 Encounter for general adult medical examination without abnormal findings: Secondary | ICD-10-CM

## 2021-10-03 DIAGNOSIS — Z79899 Other long term (current) drug therapy: Secondary | ICD-10-CM

## 2021-10-03 DIAGNOSIS — I714 Abdominal aortic aneurysm, without rupture, unspecified: Secondary | ICD-10-CM

## 2021-10-03 DIAGNOSIS — Z86010 Personal history of colonic polyps: Secondary | ICD-10-CM

## 2021-10-03 NOTE — Progress Notes (Signed)
3 MONTH OV AND MEDICARE ANNUAL WELLNESS VISIT  Assessment:   Bryan Morgan was seen today for diabetes management and medicare wellness.  Diagnoses and all orders for this visit:   1. Encounter for Medicare annual wellness exam Due Annually Declines all vaccines and medications Working on lifestyle, goals set   2. Mild hypertension Controlled  without medication. Discussed DASH (Dietary Approaches to Stop Hypertension) DASH diet is lower in sodium than a typical American diet. Cut back on foods that are high in saturated fat, cholesterol, and trans fats. Eat more whole-grain foods, fish, poultry, and nuts Remain active and exercise as tolerated daily.  Monitor BP at home-Call if greater than 130/80.   - CBC with Differential/Platelet - COMPLETE METABOLIC PANEL WITH GFR  3. Abdominal aortic aneurysm (AAA) without rupture, unspecified part ([place]) S/P EVAR 01/27/2021 for 72 mm infrarenal AAA. Last seen 09/20/2021-Continue survelience. Continue to follow with Vascular Q6M as recommended.  Last seen 09/20/2021  4. Gastroesophageal reflux disease without esophagitis Stable and well controlled via diet. Avoid triggers.  Avoid laying down after eating.  5. Hyperlipidemia due to type 2 diabetes mellitus ([place]) Uncontrolled. Unable to tolerate statin medication.   No longer wishes to pursue medications. Discussed lifestyle modifications. Recommended diet heavy in fruits and veggies, omega 3's. Decrease consumption of animal meats, cheeses, and dairy products. Remain active and exercise as tolerated. Continue to monitor.  - Lipid panel  6. Diet-controlled diabetes mellitus [facility]) Education: Reviewed 'ABCs' of diabetes management  A1C (<7) Blood pressure (<130/80) Cholesterol (LDL <70) Continue Eye Exam yearly  Continue Dental Exam Q6 mo Discussed dietary recommendations Discussed Physical Activity recommendations Foot exam UTD   - Hemoglobin A1c  7. CKD stage 2 due to type 2  diabetes mellitus ([place]) Discussed how what you eat and drink can aide in kidney protection. Stay well hydrated. Avoid high salt foods. Avoid NSAIDS. Keep BP and BG well controlled.   Take medications as prescribed. Remain active and exercise as tolerated daily. Maintain weight.  Continue to monitor.   8. ETD (Eustachian tube dysfunction), bilateral Continue to monitor   9. Benign prostatic hyperplasia without lower urinary tract symptoms TURP Procedures 03/2021. Well managed. Continue to follow with Urology. Follow Up Q6M per request.  10. Benign prostatic hyperplasia with nocturia  - PSA  11. Overweight (BMI 25.0-29.9) Discussed appropriate BMI Goal of losing 1 lb per month. Diet modification. Physical activity. Encouraged/praised to build confidence.   12. Vitamin D deficiency Continue supplement.  - VITAMIN D 25 Hydroxy (Vit-D Deficiency, Fractures)  13.  History of adenomatous polyp of colon. Due for colonoscopy.  -Ambulatory referral to gastroenterology    14. Medication management All medications discussed and reviewed in full. All questions and concerns regarding medications addressed.    - CBC with Differential/Platelet - COMPLETE METABOLIC PANEL WITH GFR - Lipid panel - VITAMIN D 25 Hydroxy (Vit-D Deficiency, Fractures) - PSA - Hemoglobin A1c  Over 30 minutes of exam, counseling, chart review, and critical decision making was performed  Future Appointments  Date Time Provider [place]  01/12/2022  9:30 AM Darrol Jump, NP [facility] None  06/23/2022 10:00 AM Unk Pinto, MD [facility] None  10/04/2022  9:00 AM Darrol Jump, NP [facility] None     Plan:   During the course of the visit the patient was educated and counseled about appropriate screening and preventive services including:   Pneumococcal vaccine  Influenza vaccine Prevnar 13 Td vaccine Screening electrocardiogram Colorectal cancer screening Diabetes  screening Glaucoma screening  Nutrition counseling    Subjective:  Bryan Morgan is a 81 y.o. male who presents for 3 month follow up and for Medicare Annual Wellness Visit.   He is new to our office in recent years. His wife comes to our office Bryan Morgan) - he is preveiously not well established with PCP, had gone to urgent care as needed.   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  Recently won the [employer].  He walks every day for 30 minutes.  He has a diagnosis of GERD which is currently managed by PRN tums with spicy foods. He reports symptoms is currently well controlled, and denies breakthrough reflux, burning in chest, hoarseness or cough.    He has a hx of adenomatous polyp of colon and is due for a colonoscopy.  BMI is Body mass index is 29.02 kg/m., he bowls once a week, he reports he walks a lot in his neighborhood. He rides the bus and walks a lot for transportation due to vision, generally very active at work, walks at least 60+ min daily.  Wt Readings from Last 3 Encounters:  10/03/21 223 lb (101.2 kg)  09/20/21 221 lb (100.2 kg)  06/17/21 221 lb 9.6 oz (100.5 kg)   His blood pressure has been controlled at home, today their BP is BP: 138/72  He does workout. He denies chest pain, shortness of breath, dizziness.   He is not on cholesterol medication (adamantly declines medications). Had reaction to Rosuvastatin, unable to tolerate d/t reports of syncopal episode. His cholesterol is not at goal. The cholesterol last visit was:   Lab Results  Component Value Date   CHOL 194 06/17/2021   HDL 37 (L) 06/17/2021   LDLCALC 129 (H) 06/17/2021   TRIG 160 (H) 06/17/2021   CHOLHDL 5.2 (H) 06/17/2021    He has been working on  diet and exercise for T2 diabetes managed by diet/lifestyle, he declines all medication management, and denies increased appetite, nausea, paresthesia of the feet, polydipsia, polyuria and vomiting. Last A1C in the office was:  Lab Results  Component Value Date   HGBA1C 6.8 (H) 06/17/2021   He has CKD II associated with T2DM monitored at this office:  Lab Results  Component Value Date   GFRAA 61 10/01/2020   Patient is on Vitamin D supplement.   Lab Results  Component Value Date   VD25OH 35 06/04/2020     BPH - minimal nocturia; He does have hx of PSA elevations and continues to follow with [facility].  Had TURP procedure 03/2021. Lab Results  Component Value Date   PSA 6.39 (H) 06/17/2021   PSA 11.06 (H) 06/04/2020   PSA 9.8 (H) 09/19/2019   Medication Review:   Current Outpatient Medications (Cardiovascular):    rosuvastatin (CRESTOR) 10 MG tablet, Take 1 tablet Daily for Cholesterol (Patient not taking: Reported on 09/20/2021)     Current Outpatient Medications (Other):    Cholecalciferol (VITAMIN D) 125 MCG (5000 UT) CAPS, Take 5,000 Units by mouth daily.   Magnesium 500 MG TABS, Take 500 mg by mouth daily.   sulfamethoxazole-trimethoprim (BACTRIM DS) 800-160 MG tablet, Take 1 tablet 2 x /day with Meals for Prostate Infection  Infection  Allergies: No Known Allergies  Current Problems (verified) has  Overweight (BMI 25.0-29.9); Benign prostatic hyperplasia without lower urinary tract symptoms; Elevated PSA; Gastroesophageal reflux disease without esophagitis; Hyperlipidemia due to type 2 diabetes mellitus ([place]); Diet-controlled diabetes mellitus ([place]); Vitamin D deficiency; CKD stage 2 due to type 2 diabetes mellitus ([place]); History of adenomatous polyp of colon; Medication therapy management recommendation refused by patient; Mild hypertension; Abnormal TSH; Legal blindness; AAA (abdominal aortic aneurysm) (HCC); BPH (benign prostatic hyperplasia); ETD (Eustachian tube  dysfunction), bilateral; and Hearing loss on their problem list.  Screening Tests Immunization History  Administered Date(s) Administered   Janssen (J&J) SARS-COV-2 Vaccination 08/16/2019, 04/02/2020   Preventative care: Last colonoscopy: 08/2018 after positive cologuard, precancerous polyps per Dr. Hilarie Fredrickson, 3 year follow up - Due   Prior vaccinations: TD or Tdap: never, declines today  Influenza: declines  Pneumococcal: declines Prevnar13: declines Shingles/Zostavax: declines Covid 19: J&J, 2021, 2022  Names of Other Physician/Practitioners you currently use: 1. [facility] here for primary care 2. Dr. Marland Kitchen In [place] at [facility], eye doctor, last visit 2017, has glasses, no change in vision due to legally blind  3. P[facility] Badder, dentist, has partials and bridges, last visit 2022, goes q25m Patient Care Team: MUnk Pinto MD as PCP - General (Internal Medicine)  Surgical: He  has a past surgical history that includes Cataract extraction, bilateral (2007); Abdominal aortic endovascular stent graft (N/A, 01/27/2021); Ultrasound guidance for vascular access (Bilateral, 01/27/2021); Transurethral resection of prostate (N/A, 03/29/2021); and Cystoscopy (N/A, 03/29/2021). Family His family history includes COPD in his brother; Diabetes in his sister and sister. Social history  He reports that he has never smoked. He has never been exposed to tobacco smoke. He has never used smokeless tobacco. He reports that he does not drink alcohol and does not use drugs.  MEDICARE WELLNESS OBJECTIVES: Physical activity: Exercise limited by: None identified Cardiac risk factors: Cardiac Risk Factors include: advanced age (>581m, >6>51omen);male gender Depression/mood screen:      10/03/2021   10:07 AM  Depression screen PHQ 2/9  Decreased Interest 0  Down, Depressed, Hopeless 0  PHQ - 2 Score 0    ADLs:     10/03/2021   10:06 AM 06/16/2021   10:17 PM   In your present state of health, do you have any difficulty performing the following activities:  Hearing? 0 1  Comment  has bilat hearing aids  Vision? 1 1  Comment Legally Blind legally blind  Difficulty concentrating or making decisions? 0 0  Walking or climbing stairs? 0 0  Dressing or bathing? 0 0  Doing errands, shopping? 0 1  Comment  requires transport  Preparing Food and eating ? N   Using the Toilet? N   In the past six months, have you accidently leaked urine? N   Do you have problems with loss of bowel control? N   Managing your Medications? N   Managing your Finances? N   Housekeeping or managing your Housekeeping? N       Cognitive Testing  Alert? Yes  Normal Appearance?Yes  Oriented to person? Yes  Place? Yes   Time? Yes  Recall of three objects?  Yes  Can perform simple calculations? Yes  Displays appropriate judgment?Yes  Can read the correct time from a watch face? N/a - very poor vision   EOL planning: Does Patient Have a Medical Advance Directive?: Yes Type of Advance Directive: Living will Does patient want to make changes to medical advance directive?: No - Patient declined  Objective:   Today's Vitals   10/03/21 0922  BP: 138/72  Pulse: (!) 56  Temp: 97.7 F (36.5 C)  SpO2: 99%  Weight: 223 lb (101.2 kg)  Height: 6' 1.5" (1.867 m)    Body mass index is 29.02 kg/m.  General appearance: alert, no distress, WD/WN, male HEENT: normocephalic, sclerae anicteric, TMs pearly, nares patent, no discharge or erythema, pharynx normal, severely poor vision, R pupil is non-reactive/torn, horizontal nystagmus with attempts to focus Oral cavity: MMM, no lesions Neck: supple, no lymphadenopathy, no thyromegaly, no masses Heart: RRR, normal S1, S2, no murmurs Lungs: CTA bilaterally, no wheezes, rhonchi, or rales Abdomen: +bs, soft, non tender, non distended, no masses, no hepatomegaly, no splenomegaly Musculoskeletal: nontender, no swelling, no obvious  deformity Extremities: no edema, no cyanosis, no clubbing Pulses: 2+ symmetric, upper and lower extremities, normal cap refill Neurological: alert, oriented x 3, CN2-12 intact (excepting vision testing deferred), strength normal upper extremities and lower extremities, sensation normal throughout, DTRs 2+ throughout, no cerebellar signs, gait normal (slow steady)  Psychiatric: normal affect, behavior normal, pleasant  Skin: albino coloring without rashes or concerning lesions  Medicare Attestation I have personally reviewed: The patient's medical and social history Their use of alcohol, tobacco or illicit drugs Their current medications and supplements The patient's functional ability including ADLs,fall risks, home safety risks, cognitive, and hearing and visual impairment Diet and physical activities Evidence for depression or mood disorders  The patient's weight, height, BMI, and visual acuity have been recorded in the chart.  I have made referrals, counseling, and provided education to the patient based on review of the above and I have provided the patient with a written personalized care plan for preventive services.     Darrol Jump, NP   10/03/2021

## 2021-10-03 NOTE — Patient Instructions (Signed)

## 2021-10-04 LAB — CBC WITH DIFFERENTIAL/PLATELET
Absolute Monocytes: 324 cells/uL (ref 200–950)
Basophils Absolute: 32 cells/uL (ref 0–200)
Basophils Relative: 0.8 %
Eosinophils Absolute: 60 cells/uL (ref 15–500)
Eosinophils Relative: 1.5 %
HCT: 40.8 % (ref 38.5–50.0)
Hemoglobin: 13.9 g/dL (ref 13.2–17.1)
Lymphs Abs: 1460 cells/uL (ref 850–3900)
MCH: 29.7 pg (ref 27.0–33.0)
MCHC: 34.1 g/dL (ref 32.0–36.0)
MCV: 87.2 fL (ref 80.0–100.0)
MPV: 10.6 fL (ref 7.5–12.5)
Monocytes Relative: 8.1 %
Neutro Abs: 2124 cells/uL (ref 1500–7800)
Neutrophils Relative %: 53.1 %
Platelets: 246 10*3/uL (ref 140–400)
RBC: 4.68 10*6/uL (ref 4.20–5.80)
RDW: 13.6 % (ref 11.0–15.0)
Total Lymphocyte: 36.5 %
WBC: 4 10*3/uL (ref 3.8–10.8)

## 2021-10-04 LAB — COMPLETE METABOLIC PANEL WITH GFR
AG Ratio: 1.4 (calc) (ref 1.0–2.5)
ALT: 17 U/L (ref 9–46)
AST: 18 U/L (ref 10–35)
Albumin: 4.3 g/dL (ref 3.6–5.1)
Alkaline phosphatase (APISO): 57 U/L (ref 35–144)
BUN/Creatinine Ratio: 11 (calc) (ref 6–22)
BUN: 15 mg/dL (ref 7–25)
CO2: 27 mmol/L (ref 20–32)
Calcium: 9.7 mg/dL (ref 8.6–10.3)
Chloride: 102 mmol/L (ref 98–110)
Creat: 1.41 mg/dL — ABNORMAL HIGH (ref 0.70–1.22)
Globulin: 3.1 g/dL (calc) (ref 1.9–3.7)
Glucose, Bld: 108 mg/dL — ABNORMAL HIGH (ref 65–99)
Potassium: 4.8 mmol/L (ref 3.5–5.3)
Sodium: 140 mmol/L (ref 135–146)
Total Bilirubin: 0.4 mg/dL (ref 0.2–1.2)
Total Protein: 7.4 g/dL (ref 6.1–8.1)
eGFR: 50 mL/min/{1.73_m2} — ABNORMAL LOW (ref >=60)

## 2021-10-04 LAB — LIPID PANEL
Cholesterol: 190 mg/dL (ref <200)
HDL: 34 mg/dL — ABNORMAL LOW (ref >=40)
LDL Cholesterol (Calc): 121 mg/dL (calc) — ABNORMAL HIGH
Non-HDL Cholesterol (Calc): 156 mg/dL (calc) — ABNORMAL HIGH (ref <130)
Total CHOL/HDL Ratio: 5.6 (calc) — ABNORMAL HIGH (ref <5.0)
Triglycerides: 233 mg/dL — ABNORMAL HIGH (ref <150)

## 2021-10-04 LAB — HEMOGLOBIN A1C
Hgb A1c MFr Bld: 6.4 % of total Hgb — ABNORMAL HIGH (ref <5.7)
Mean Plasma Glucose: 137 mg/dL
eAG (mmol/L): 7.6 mmol/L

## 2021-10-04 LAB — PSA: PSA: 10.03 ng/mL — ABNORMAL HIGH (ref <=4.00)

## 2021-10-04 LAB — VITAMIN D 25 HYDROXY (VIT D DEFICIENCY, FRACTURES): Vit D, 25-Hydroxy: 69 ng/mL (ref 30–100)

## 2021-10-14 ENCOUNTER — Encounter: Payer: Self-pay | Admitting: Internal Medicine

## 2022-01-12 ENCOUNTER — Ambulatory Visit: Payer: PPO | Admitting: Nurse Practitioner

## 2022-02-03 ENCOUNTER — Ambulatory Visit (INDEPENDENT_AMBULATORY_CARE_PROVIDER_SITE_OTHER): Payer: PPO | Admitting: Nurse Practitioner

## 2022-02-03 ENCOUNTER — Encounter: Payer: Self-pay | Admitting: Nurse Practitioner

## 2022-02-03 VITALS — BP 140/80 | HR 81 | Temp 97.9°F | Resp 16 | Ht 73.5 in | Wt 226.4 lb

## 2022-02-03 DIAGNOSIS — E119 Type 2 diabetes mellitus without complications: Secondary | ICD-10-CM

## 2022-02-03 DIAGNOSIS — K219 Gastro-esophageal reflux disease without esophagitis: Secondary | ICD-10-CM | POA: Diagnosis not present

## 2022-02-03 DIAGNOSIS — R351 Nocturia: Secondary | ICD-10-CM

## 2022-02-03 DIAGNOSIS — I10 Essential (primary) hypertension: Secondary | ICD-10-CM | POA: Diagnosis not present

## 2022-02-03 DIAGNOSIS — N401 Enlarged prostate with lower urinary tract symptoms: Secondary | ICD-10-CM

## 2022-02-03 DIAGNOSIS — E785 Hyperlipidemia, unspecified: Secondary | ICD-10-CM

## 2022-02-03 DIAGNOSIS — E1122 Type 2 diabetes mellitus with diabetic chronic kidney disease: Secondary | ICD-10-CM

## 2022-02-03 DIAGNOSIS — I714 Abdominal aortic aneurysm, without rupture, unspecified: Secondary | ICD-10-CM | POA: Diagnosis not present

## 2022-02-03 DIAGNOSIS — E1169 Type 2 diabetes mellitus with other specified complication: Secondary | ICD-10-CM | POA: Diagnosis not present

## 2022-02-03 DIAGNOSIS — N182 Chronic kidney disease, stage 2 (mild): Secondary | ICD-10-CM

## 2022-02-03 DIAGNOSIS — Z79899 Other long term (current) drug therapy: Secondary | ICD-10-CM

## 2022-02-03 DIAGNOSIS — E663 Overweight: Secondary | ICD-10-CM

## 2022-02-03 NOTE — Progress Notes (Signed)
3 MONTH OV  Assessment:   Bryan Bryan Morgan was seen today for diabetes management and medicare wellness.  Diagnoses and all orders for this visit:  Mild hypertension Controlled  without medication. Discussed DASH (Dietary Approaches to Stop Hypertension) DASH diet is lower in sodium than a typical American diet. Cut back on foods that are high in saturated fat, cholesterol, and trans fats. Eat more whole-grain foods, fish, poultry, and nuts Remain active and exercise as tolerated daily.  Monitor BP at home-Call if greater than 130/80.    Abdominal aortic aneurysm (AAA) without rupture, unspecified part ([place]) S/P EVAR 01/27/2021 for 72 mm infrarenal AAA. Last seen 09/20/2021-Continue survelience. Continue to follow with Vascular Q6M as recommended.   Gastroesophageal reflux disease without esophagitis No suspected reflux complications ([place]/stricture). Lifestyle modification:  wt loss, avoid meals 2-3h before bedtime. Consider eliminating food triggers:  chocolate, caffeine, EtOH, acid/spicy food.  Hyperlipidemia Bryan Morgan to type 2 diabetes mellitus (HCC) Uncontrolled. Unable to tolerate statin medication.   No longer wishes to pursue medications. Discussed lifestyle modifications. Recommended diet heavy in fruits and veggies, omega 3's. Decrease consumption of animal meats, cheeses, and dairy products. Remain active and exercise as tolerated. Continue to monitor.  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 Stay well hydrated. Avoid high salt foods. Avoid NSAIDS. Keep BP and BG well controlled.   Take medications as  prescribed. Remain active and exercise as tolerated daily. Maintain weight.  Continue to monitor.   Benign prostatic hyperplasia without lower urinary tract symptoms/BPH with nocturia TURP Procedures 03/2021. Well managed. Continue to follow with Urology. Follow Up Q6M per request. Check and monitor PSA  Overweight (BMI 25.0-29.9) Discussed appropriate BMI Goal of losing 1 lb per month. Diet modification. Physical activity. Encouraged/praised to build confidence.  Medication management All medications discussed and reviewed in full. All questions and concerns regarding medications addressed.    Orders Placed This Encounter  Procedures   CBC with Differential/Platelet   COMPLETE METABOLIC PANEL WITH GFR   Lipid panel   Hemoglobin A1c    Notify office for further evaluation and treatment, questions or concerns if any reported s/s fail to improve.   The patient was advised to call back or seek an in-person evaluation if any symptoms worsen or if the condition fails to improve as anticipated.   Further disposition pending results of labs. Discussed med's effects and SE's.    I discussed the assessment and treatment plan with the patient. The patient was provided an opportunity to ask questions and all were answered. The patient agreed with the plan and demonstrated an understanding of the instructions.  Discussed med's effects and SE's. Screening labs and tests as requested with regular follow-up as recommended.  I provided 20 minutes of face-to-face time during this encounter including counseling, chart review, and critical decision making was preformed..  Future Appointments  Date Time Provider [place]  06/23/2022 10:00 AM Unk Pinto, MD [facility] None  10/04/2022  9:00 AM Darrol Jump, NP [facility] None    Subjective:  Bryan Bryan Morgan is a 81 y.o. male who presents for 3 month follow up.  Overall he reports feeling well today.  He has no new  concerns at this time.   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  Recently won the [employer].  He continues to  walk every day for 30 minutes.  He has a diagnosis of GERD which is currently managed by PRN tums with spicy foods. He reports symptoms is currently well controlled, and denies breakthrough reflux, burning in chest, hoarseness or cough.    BMI is Body mass index is 29.46 kg/m., he bowls once a week, he reports he walks a lot in his neighborhood. He rides the bus and walks a lot for transportation Bryan Morgan to vision Wt Readings from Last 3 Encounters:  02/03/22 226 lb 6.4 oz (102.7 kg)  10/03/21 223 lb (101.2 kg)  09/20/21 221 lb (100.2 kg)   His blood pressure has been controlled at home, today their BP is BP: (!) 140/80  He does workout. He denies chest pain, shortness of breath, dizziness.   He is not on cholesterol medication (adamantly declines medications). Had reaction to Rosuvastatin, unable to tolerate d/t reports of syncopal episode. His cholesterol is not at goal. The cholesterol last visit was:   Lab Results  Component Value Date   CHOL 190 10/03/2021   HDL 34 (L) 10/03/2021   LDLCALC 121 (H) 10/03/2021   TRIG 233 (H) 10/03/2021   CHOLHDL 5.6 (H) 10/03/2021    He has been working on diet and exercise for T2 diabetes managed by diet/lifestyle, he declines all medication management, and denies increased appetite, nausea, paresthesia of the feet, polydipsia, polyuria and vomiting. Last A1C in the office was:  Lab Results  Component Value Date   HGBA1C 6.4 (H) 10/03/2021   He has CKD II associated with T2DM monitored at this office:  Lab Results  Component Value Date   GFRAA 61 10/01/2020   Patient is on Vitamin D supplement.    Lab Results  Component Value Date   VD25OH 69 10/03/2021     BPH - minimal nocturia; He does have hx of PSA elevations and continues to follow with [facility].  Had TURP procedure 03/2021. Lab Results  Component Value Date   PSA 10.03 (H) 10/03/2021   PSA 6.39 (H) 06/17/2021   PSA 11.06 (H) 06/04/2020   Medication Review:   Current Outpatient Medications (Cardiovascular):    rosuvastatin (CRESTOR) 10 MG tablet, Take 1 tablet Daily for Cholesterol (Patient not taking: Reported on 02/03/2022)     Current Outpatient Medications (Other):    Cholecalciferol (VITAMIN D) 125 MCG (5000 UT) CAPS, Take 5,000 Units by mouth daily.   Magnesium 500 MG TABS, Take 500 mg by mouth daily.   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  Screening Tests Immunization History  Administered Date(s) Administered   Janssen (J&J) SARS-COV-2 Vaccination 08/16/2019, 04/02/2020    Surgical: He  has a past surgical history that includes Cataract extraction, bilateral (2007); Abdominal aortic endovascular stent graft (N/A, 01/27/2021); Ultrasound guidance for vascular access  (Bilateral, 01/27/2021); Transurethral resection of prostate (N/A, 03/29/2021); and Cystoscopy (N/A, 03/29/2021). Family His family history includes COPD in his brother; Diabetes in his sister and sister. Social history  He reports that he has never smoked. He has never been exposed to tobacco smoke. He has never used smokeless tobacco. He reports that he does not drink alcohol and does not use drugs.       Objective:   Today's Vitals   02/03/22 0924  BP: (!) 140/80  Pulse: 81  Resp: 16  Temp: 97.9 F (36.6 C)  SpO2: 97%  Weight: 226 lb 6.4 oz (102.7 kg)  Height: 6' 1.5" (1.867 m)   General appearance: alert, no distress, WD/WN, male HEENT: normocephalic, sclerae anicteric, TMs pearly, nares patent, no discharge or erythema, pharynx normal, severely poor vision, R pupil is non-reactive/torn, horizontal nystagmus with attempts to focus Oral cavity: MMM, no lesions Neck: supple, no lymphadenopathy, no thyromegaly, no masses Heart: RRR, normal S1, S2, no murmurs Lungs: CTA bilaterally, no wheezes, rhonchi, or rales Abdomen: +bs, soft, non tender, non distended, no masses, no hepatomegaly, no splenomegaly Musculoskeletal: nontender, no swelling, no obvious deformity Extremities: no edema, no cyanosis, no clubbing Pulses: 2+ symmetric, upper and lower extremities, normal cap refill Neurological: alert, oriented x 3, CN2-12 intact (excepting vision testing deferred), strength normal upper extremities and lower extremities, sensation normal throughout, DTRs 2+ throughout, no cerebellar signs, gait normal (slow steady)  Psychiatric: normal affect, behavior normal, pleasant  Skin: albino coloring without rashes or concerning lesions   Darrol Jump, NP   02/03/2022

## 2022-02-04 LAB — COMPLETE METABOLIC PANEL WITH GFR
AG Ratio: 1.4 (calc) (ref 1.0–2.5)
ALT: 13 U/L (ref 9–46)
AST: 13 U/L (ref 10–35)
Albumin: 4.2 g/dL (ref 3.6–5.1)
Alkaline phosphatase (APISO): 54 U/L (ref 35–144)
BUN/Creatinine Ratio: 13 (calc) (ref 6–22)
BUN: 19 mg/dL (ref 7–25)
CO2: 30 mmol/L (ref 20–32)
Calcium: 10.2 mg/dL (ref 8.6–10.3)
Chloride: 102 mmol/L (ref 98–110)
Creat: 1.45 mg/dL — ABNORMAL HIGH (ref 0.70–1.22)
Globulin: 2.9 g/dL (calc) (ref 1.9–3.7)
Glucose, Bld: 141 mg/dL — ABNORMAL HIGH (ref 65–99)
Potassium: 4.1 mmol/L (ref 3.5–5.3)
Sodium: 140 mmol/L (ref 135–146)
Total Bilirubin: 0.4 mg/dL (ref 0.2–1.2)
Total Protein: 7.1 g/dL (ref 6.1–8.1)
eGFR: 48 mL/min/{1.73_m2} — ABNORMAL LOW (ref >=60)

## 2022-02-04 LAB — CBC WITH DIFFERENTIAL/PLATELET
Absolute Monocytes: 329 cells/uL (ref 200–950)
Basophils Absolute: 9 cells/uL (ref 0–200)
Basophils Relative: 0.2 %
Eosinophils Absolute: 41 cells/uL (ref 15–500)
Eosinophils Relative: 0.9 %
HCT: 39.3 % (ref 38.5–50.0)
Hemoglobin: 13.2 g/dL (ref 13.2–17.1)
Lymphs Abs: 1472 cells/uL (ref 850–3900)
MCH: 30.1 pg (ref 27.0–33.0)
MCHC: 33.6 g/dL (ref 32.0–36.0)
MCV: 89.7 fL (ref 80.0–100.0)
MPV: 10.9 fL (ref 7.5–12.5)
Monocytes Relative: 7.3 %
Neutro Abs: 2651 cells/uL (ref 1500–7800)
Neutrophils Relative %: 58.9 %
Platelets: 232 10*3/uL (ref 140–400)
RBC: 4.38 10*6/uL (ref 4.20–5.80)
RDW: 13.6 % (ref 11.0–15.0)
Total Lymphocyte: 32.7 %
WBC: 4.5 10*3/uL (ref 3.8–10.8)

## 2022-02-04 LAB — HEMOGLOBIN A1C
Hgb A1c MFr Bld: 6.8 % of total Hgb — ABNORMAL HIGH (ref <5.7)
Mean Plasma Glucose: 148 mg/dL
eAG (mmol/L): 8.2 mmol/L

## 2022-02-04 LAB — LIPID PANEL
Cholesterol: 193 mg/dL (ref <200)
HDL: 35 mg/dL — ABNORMAL LOW (ref >=40)
LDL Cholesterol (Calc): 117 mg/dL (calc) — ABNORMAL HIGH
Non-HDL Cholesterol (Calc): 158 mg/dL (calc) — ABNORMAL HIGH (ref <130)
Total CHOL/HDL Ratio: 5.5 (calc) — ABNORMAL HIGH (ref <5.0)
Triglycerides: 287 mg/dL — ABNORMAL HIGH (ref <150)

## 2022-02-07 NOTE — Progress Notes (Signed)
lmom 

## 2022-05-22 ENCOUNTER — Other Ambulatory Visit: Payer: Self-pay | Admitting: *Deleted

## 2022-05-22 DIAGNOSIS — I714 Abdominal aortic aneurysm, without rupture, unspecified: Secondary | ICD-10-CM

## 2022-05-22 NOTE — Progress Notes (Unsigned)
[facility] [place] PROGRESS NOTE  ASSESSMENT / PLAN: Bryan Morgan is a 82 y.o. male status post EVAR 01/27/21 for 57m infrarenal abdominal aortic aneurysm noted during workup for urinary retention. 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  We will continue surveillance.  We will plan for follow-up with EVAR duplex in 6 months.  SUBJECTIVE: No complaints. Doing well overall  OBJECTIVE: There were no vitals taken for this visit.  Constitutional: well appearing. no acute distress. CNS: normal gait and station Cardiac: RRR. Pulmonary: unlabored Abdomen: soft, not tender Vascular: access sites healed     Latest Ref Rng & Units 02/03/2022   12:00 AM 10/03/2021    9:56 AM 06/17/2021   10:00 AM  CBC  WBC 3.8 - 10.8 Thousand/uL 4.5  4.0  3.9   Hemoglobin 13.2 - 17.1 g/dL 13.2  13.9  13.0   Hematocrit 38.5 - 50.0 % 39.3  40.8  39.9   Platelets 140 - 400 Thousand/uL 232  246  249         Latest Ref Rng & Units 02/03/2022   12:00 AM 10/03/2021    9:56 AM 09/16/2021   11:44 AM  CMP  Glucose 65 - 99 mg/dL 141  108    BUN 7 - 25 mg/dL 19  15    Creatinine 0.70 - 1.22 mg/dL 1.45  1.41  1.50   Sodium 135 - 146 mmol/L 140  140    Potassium 3.5 - 5.3 mmol/L 4.1  4.8    Chloride 98 - 110 mmol/L 102  102    CO2 20 - 32 mmol/L 30  27    Calcium 8.6 - 10.3 mg/dL 10.2  9.7    Total Protein 6.1 - 8.1 g/dL 7.1  7.4    Total Bilirubin 0.2 - 1.2 mg/dL 0.4  0.4    AST 10 - 35 U/L 13  18    ALT 9 - 46 U/L 13  17      Pre-clinic visit CT angiogram reviewed in detail.  Patient has a small, persistent type II endoleak.  Otherwise satisfactory exclusion of the 74 mm infrarenal abdominal aortic aneurysm.  ThYevonne AlineHaStanford BreedMD [facility] LPhone Number: [phone]/26/2024 8:14 PM

## 2022-05-23 ENCOUNTER — Ambulatory Visit (INDEPENDENT_AMBULATORY_CARE_PROVIDER_SITE_OTHER): Payer: PPO | Admitting: Vascular Surgery

## 2022-05-23 ENCOUNTER — Encounter: Payer: Self-pay | Admitting: Vascular Surgery

## 2022-05-23 ENCOUNTER — Ambulatory Visit (HOSPITAL_COMMUNITY)
Admission: RE | Admit: 2022-05-23 | Discharge: 2022-05-23 | Disposition: A | Discharge status: Home / Self Care | Payer: PPO | Source: Ambulatory Visit | Attending: Vascular Surgery | Admitting: Vascular Surgery

## 2022-05-23 VITALS — BP 138/79 | HR 65 | Temp 98.3°F | Resp 20 | Ht 73.5 in | Wt 226.0 lb

## 2022-05-23 DIAGNOSIS — I714 Abdominal aortic aneurysm, without rupture, unspecified: Secondary | ICD-10-CM | POA: Diagnosis not present

## 2022-05-23 DIAGNOSIS — Z9889 Other specified postprocedural states: Secondary | ICD-10-CM

## 2022-05-23 DIAGNOSIS — Z8679 Personal history of other diseases of the circulatory system: Secondary | ICD-10-CM | POA: Diagnosis not present

## 2022-05-25 ENCOUNTER — Other Ambulatory Visit: Payer: Self-pay

## 2022-05-25 DIAGNOSIS — I714 Abdominal aortic aneurysm, without rupture, unspecified: Secondary | ICD-10-CM

## 2022-05-25 DIAGNOSIS — I7143 Infrarenal abdominal aortic aneurysm, without rupture: Secondary | ICD-10-CM

## 2022-06-23 ENCOUNTER — Encounter: Payer: PPO | Admitting: Internal Medicine

## 2022-06-29 NOTE — Progress Notes (Signed)
Annual  Screening/Preventative Visit  & Comprehensive Evaluation & Examination  Future Appointments  Date Time Provider Department  06/30/2022                         cpe 11:00 AM [name], MD [facility]  10/04/2022                      wellness  9:00 AM [name], NP [facility]  10/31/2022 10:00 AM [name], MD [facility]  07/05/2023                     cpe 11:00 AM [name], MD [facility]          This very nice 82 y.o. MBM  w [place] & legal blindness since birth presents for a Screening /Preventative Visit & comprehensive evaluation and management of multiple medical co-morbidities.  Patient has been followed for HTN, HLD, T2_NIDDM and Vitamin D Deficiency. Patient has  GERD  controlled with diet & his meds.        Labile HTN predates circa 2020. Patient's BP has been controlled at home.  Today's BP is at goal -  130/78. Patient denies any cardiac symptoms as chest pain, palpitations, shortness of breath, dizziness or ankle swelling.        Patient's hyperlipidemia is not controlled with diet . Last lipids were not at goal :  Lab Results  Component Value Date   CHOL 193 02/03/2022   HDL 35 (L) 02/03/2022   LDLCALC 117 (H) 02/03/2022   TRIG 287 (H) 02/03/2022   CHOLHDL 5.5 (H) 02/03/2022         Patient has hx/o T2_NIDDM (A1c 6.6%/May2020) w/CKD2 (GFR 67) and has been controlled with diet.   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 Patient denies reactive hypoglycemic symptoms, diabetic polys or paresthesias. Last A1c was not at goal :   Lab Results  Component Value Date   HGBA1C 6.8 (H) 02/03/2022         Finally, patient has history of Vitamin D Deficiency ("13"/May 2020) and last vitamin D was  at goal :   Lab Results  Component Value Date   VD25OH 69 10/03/2021       Current Outpatient Medications  Medication Instructions   Magnesium  500 mg Daily   tamsulosin 0.4  mg Daily at bedtime   Vitamin D 5,000 Units Daily     Past Medical History:  Diagnosis Date   AAA (abdominal aortic aneurysm)    BPH (benign prostatic hyperplasia)    with retention, s/p foley catheter placement in [facility] 12/29/20   History of kidney stones    History of needle biopsy of prostate with negative result    Legal blindness    Positive colorectal cancer screening using Cologuard test 08/20/2018   08/18/2018 - GI referral placed; tubular & tubulovillous adenomas 09/12/18 colonoscopy, 3 year f/u rec   Pre-diabetes 06/04/2020     Health Maintenance  Topic Date Due   OPHTHALMOLOGY EXAM  Never done   TETANUS/TDAP  Never done   Zoster Vaccines- Shingrix (1 of 2) Never done   Pneumonia Vaccine 47+ Years old (1 - PCV) Never done   COVID-19 Vaccine (3 - Booster for Janssen series) 05/28/2020   INFLUENZA VACCINE  Never done   FOOT EXAM  06/03/2021   URINE MICROALBUMIN  06/04/2021   HEMOGLOBIN A1C  09/23/2021   HPV VACCINES  Aged Out     Immunization History  Administered Date(s) Administered   Comptroller (J&J) SARS-COV-2 Vaccination 08/16/2019, 04/02/2020   Positive Cologuard test 08/20/2018    Last Colon - 09/12/2018 - Dr Rhea Belton Recc 3 year f/u due June 2023  Past Surgical History:  Procedure Laterality Date   ABDOMINAL AORTIC ENDOVASCULAR STENT GRAFT N/A 01/27/2021   Procedure: ABDOMINAL AORTIC ENDOVASCULAR STENT GRAFT REPAIR;  Surgeon: [name], MD;  Location: [facility] OR;  Service: Vascular;  Laterality: N/A;   CATARACT EXTRACTION, BILATERAL  2007   CYSTOSCOPY N/A 03/29/2021   Procedure: CYSTOSCOPY;  Surgeon: Belva Agee, MD;  Location: [facility]S;  Service: Urology;  Laterality: N/A;   TRANSURETHRAL RESECTION OF PROSTATE N/A 03/29/2021   Procedure: TRANSURETHRAL RESECTION OF THE PROSTATE (TURP);  Surgeon: Belva Agee, MD;  Location: [facility];  Service: Urology;  Laterality: N/A;   ULTRASOUND GUIDANCE FOR VASCULAR ACCESS Bilateral 01/27/2021   Procedure:  ULTRASOUND GUIDANCE FOR VASCULAR ACCESS, BILATERAL FEMORAL ARTERIES;  Surgeon: [name], MD;  Location: [facility] OR;  Service: Vascular;  Laterality: Bilateral;     Family History  Problem Relation Age of Onset   Diabetes Sister    COPD Brother    Diabetes Sister    Colon cancer Neg Hx    Esophageal cancer Neg Hx    Stomach cancer Neg Hx    Pancreatic cancer Neg Hx    Liver disease Neg Hx      Social History   Tobacco Use   Smoking status: Never   Smokeless tobacco: Never  Vaping Use   Vaping Use: Never used  Substance Use Topics   Alcohol use: No    Comment: rare, 1 drink twice a year   Drug use: No      ROS Constitutional: Denies fever, chills, weight loss/gain, headaches, insomnia,  night sweats or change in appetite. Does c/o fatigue. Eyes: Denies redness, blurred vision, diplopia, discharge, itchy or watery eyes.  ENT: Denies discharge, congestion, post nasal drip, epistaxis, sore throat, earache, hearing loss, dental pain, Tinnitus, Vertigo, Sinus pain or snoring.  Cardio: Denies chest pain, palpitations, irregular heartbeat, syncope, dyspnea, diaphoresis, orthopnea, PND, claudication or edema Respiratory: denies cough, dyspnea, DOE, pleurisy, hoarseness, laryngitis or wheezing.  Gastrointestinal: Denies dysphagia, heartburn, reflux, water brash, pain, cramps, nausea, vomiting, bloating, diarrhea, constipation, hematemesis, melena, hematochezia, jaundice or hemorrhoids Genitourinary: Denies dysuria, frequency, discharge, hematuria or flank pain. Has urgency, nocturia x 2-3 & occasional hesitancy. Musculoskeletal: Denies arthralgia, myalgia, stiffness, Jt. Swelling, pain, limp or strain/sprain. Denies Falls. Skin: Denies puritis, rash, hives, warts, acne, eczema or change in skin lesion Neuro: No weakness, tremor, incoordination, spasms, paresthesia or pain Psychiatric: Denies confusion, memory loss or sensory loss. Denies Depression. Endocrine: Denies change in  weight, skin, hair change, nocturia, and paresthesia, diabetic polys, visual blurring or hyper / hypo glycemic episodes.  Heme/Lymph: No excessive bleeding, bruising or enlarged lymph nodes.   Physical Exam  BP 130/78   Pulse 68   Temp 97.6 F (36.4 C)   Resp 17   Ht 6' 1.5" (1.867 m)   Wt 225 lb 9.6 oz (102.3 kg)   SpO2 97%   BMI 29.36 kg/m   General Appearance: Well nourished and well groomed and in no apparent distress.  Eyes: Sl anisocoria w/o tonic light reflex and Rt pupil  elliptical. 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 Vision decreased to forms w/o details. Sinuses: No frontal/maxillary tenderness ENT/Mouth: EACs patent / TMs  nl. Nares clear without erythema, swelling, mucoid exudates. Oral hygiene is good. No erythema, swelling, or exudate. Tongue normal, non-obstructing. Tonsils not swollen or erythematous. Hearing normal.  Neck: Supple, thyroid not palpable. No bruits, nodes or JVD. Respiratory: Respiratory effort normal.  BS equal and clear bilateral without rales, rhonci, wheezing or stridor. Cardio: Heart sounds are normal with regular rate and rhythm and no murmurs, rubs or gallops. Peripheral pulses are normal and equal bilaterally without edema. No aortic or femoral bruits. Chest: symmetric with normal excursions and percussion.  Abdomen: Soft, with Nl bowel sounds. Nontender, no guarding, rebound, hernias, masses, or organomegaly.  Lymphatics: Non tender without lymphadenopathy.  Musculoskeletal: Full ROM all peripheral extremities, joint stability, 5/5 strength, and normal gait. Skin: Warm and dry without rashes, lesions, cyanosis, clubbing or  ecchymosis.  Neuro: Cranial nerves intact, reflexes equal bilaterally. Normal muscle tone, no cerebellar symptoms. Sensation intact.  Pysch: Alert and oriented X 3 with normal affect, insight and judgment appropriate.   Assessment and Plan  1.  Annual Preventative/Screening Exam    2. Legal blindness   3. Labile hypertension  - EKG 12-Lead - Urinalysis, Routine w reflex microscopic - Microalbumin / creatinine urine ratio - CBC with Differential/Platelet - COMPLETE METABOLIC PANEL WITH GFR - Magnesium - TSH  4. Hyperlipidemia due to type 2 diabetes mellitus (HCC)  - EKG 12-Lead - Lipid panel - TSH  5. Type 2 diabetes mellitus with stage 2 chronic kidney  disease, without long-term current use of insulin (HCC)  - EKG 12-Lead - Urinalysis, Routine w reflex microscopic - Microalbumin / creatinine urine ratio - HM DIABETES FOOT EXAM - PR LOW EXTEMITY NEUR EXAM DOCUM - Hemoglobin A1c - Insulin, random  6. Vitamin D deficiency  - VITAMIN D 25 Hydroxy  7. Hypothyroidism  - TSH  8. Gastroesophageal reflux disease without esophagitis  - CBC with Differential/Platelet  9. Benign prostatic hyperplasia without lower urinary tract symptoms  - PSA  10. Infrarenal abdominal aortic aneurysm (AAA) without rupture   11. BPH with obstruction/lower urinary tract symptoms  - PSA  12. Prostate cancer screening  - PSA  13. Screening for ischemic heart disease  - EKG 12-Lead  14. FH: hypertension  - EKG 12-Lead  15. Screening for AAA (aortic abdominal aneurysm)   16. Medication management  - Urinalysis, Routine w reflex microscopic - Microalbumin / creatinine urine ratio - PSA - CBC with Differential/Platelet - COMPLETE METABOLIC PANEL WITH GFR - Magnesium - Lipid panel - TSH - Hemoglobin A1c - Insulin, random - VITAMIN D 25 Hydroxy  17. Abdominal aortic aneurysm (AAA)             Patient was counseled in prudent diet, weight control to achieve/maintain BMI less than 25, BP monitoring, regular exercise and medications as discussed.  Discussed med effects and SE's. Routine screening labs and tests as requested with regular follow-up as recommended. Over 40 minutes of exam, counseling, chart  review and high complex critical decision making was performed   Marinus Maw, MD

## 2022-06-30 ENCOUNTER — Encounter: Payer: Self-pay | Admitting: Internal Medicine

## 2022-06-30 ENCOUNTER — Ambulatory Visit (INDEPENDENT_AMBULATORY_CARE_PROVIDER_SITE_OTHER): Payer: PPO | Admitting: Internal Medicine

## 2022-06-30 VITALS — BP 130/78 | HR 68 | Temp 97.6°F | Resp 17 | Ht 73.5 in | Wt 225.6 lb

## 2022-06-30 DIAGNOSIS — Z1211 Encounter for screening for malignant neoplasm of colon: Secondary | ICD-10-CM

## 2022-06-30 DIAGNOSIS — I7143 Infrarenal abdominal aortic aneurysm, without rupture: Secondary | ICD-10-CM

## 2022-06-30 DIAGNOSIS — I714 Abdominal aortic aneurysm, without rupture, unspecified: Secondary | ICD-10-CM

## 2022-06-30 DIAGNOSIS — Z136 Encounter for screening for cardiovascular disorders: Secondary | ICD-10-CM | POA: Diagnosis not present

## 2022-06-30 DIAGNOSIS — K219 Gastro-esophageal reflux disease without esophagitis: Secondary | ICD-10-CM | POA: Diagnosis not present

## 2022-06-30 DIAGNOSIS — E1122 Type 2 diabetes mellitus with diabetic chronic kidney disease: Secondary | ICD-10-CM | POA: Diagnosis not present

## 2022-06-30 DIAGNOSIS — R0989 Other specified symptoms and signs involving the circulatory and respiratory systems: Secondary | ICD-10-CM

## 2022-06-30 DIAGNOSIS — Z0000 Encounter for general adult medical examination without abnormal findings: Secondary | ICD-10-CM | POA: Diagnosis not present

## 2022-06-30 DIAGNOSIS — Z8249 Family history of ischemic heart disease and other diseases of the circulatory system: Secondary | ICD-10-CM

## 2022-06-30 DIAGNOSIS — N182 Chronic kidney disease, stage 2 (mild): Secondary | ICD-10-CM | POA: Diagnosis not present

## 2022-06-30 DIAGNOSIS — Z79899 Other long term (current) drug therapy: Secondary | ICD-10-CM

## 2022-06-30 DIAGNOSIS — E1169 Type 2 diabetes mellitus with other specified complication: Secondary | ICD-10-CM

## 2022-06-30 DIAGNOSIS — Z0001 Encounter for general adult medical examination with abnormal findings: Secondary | ICD-10-CM

## 2022-06-30 DIAGNOSIS — Z125 Encounter for screening for malignant neoplasm of prostate: Secondary | ICD-10-CM | POA: Diagnosis not present

## 2022-06-30 DIAGNOSIS — N400 Enlarged prostate without lower urinary tract symptoms: Secondary | ICD-10-CM

## 2022-06-30 DIAGNOSIS — E119 Type 2 diabetes mellitus without complications: Secondary | ICD-10-CM

## 2022-06-30 DIAGNOSIS — E559 Vitamin D deficiency, unspecified: Secondary | ICD-10-CM

## 2022-06-30 NOTE — Patient Instructions (Signed)

## 2022-07-01 ENCOUNTER — Encounter: Payer: Self-pay | Admitting: Internal Medicine

## 2022-07-01 ENCOUNTER — Other Ambulatory Visit: Payer: Self-pay | Admitting: Internal Medicine

## 2022-07-01 DIAGNOSIS — E1122 Type 2 diabetes mellitus with diabetic chronic kidney disease: Secondary | ICD-10-CM

## 2022-07-01 MED ORDER — METFORMIN HCL ER 500 MG PO TB24: ORAL_TABLET | ORAL | 3 refills | Status: DC

## 2022-07-01 NOTE — Progress Notes (Signed)
<><><><><><><><><><><><><><><><><><><><><><><><><><><><><><><><><> <><><><><><><><><><><><><><><><><><><><><><><><><><><><><><><><><>  - PSA is elevated again, but Dr Benancio Deeds ( Urologist ) says OK - No sign of cancer <><><><><><><><><><><><><><><><><><><><><><><><><><><><><><><><><>  - Kidney functions stable  <><><><><><><><><><><><><><><><><><><><><><><><><><><><><><><><><>  -  Total chol  = 186  is elevated  as is LDL Chol 112  ( goal is 70 )   - So need a stricter  low cholesterol diet   - Cholesterol only comes from animal sources                                                                             - ie. meat, dairy, egg yolks  - Eat all the vegetables you want.  - Avoid Meat, Avoid Meat,  Avoid Meat                                                          - especially Red Meat - Beef AND Pork .  - Avoid cheese & dairy - milk & ice cream.     - Cheese is the most concentrated form of trans-fats which  is the worst thing to clog up our arteries.   - Veggie cheese is OK which can be found in the fresh                          produce section at [employer] or [employer] or [employer] <><><><><><><><><><><><><><><><><><><><><><><><><><><><><><><><><> <><><><><><><><><><><><><><><><><><><><><><><><><><><><><><><><><>  -  also Triglycerides (  270  ) or fats in blood are too high                 (   Ideal or  Goal is less than 150  !  )    - Recommend avoid fried & greasy foods,  sweets / candy,   - Avoid white rice  (brown or wild rice or Quinoa is OK),   - Avoid white potatoes  (sweet potatoes are OK)   - Avoid anything made from white flour  - bagels, doughnuts, rolls, buns, biscuits, white and   wheat breads, pizza crust and traditional  pasta made of white flour & egg white  - (vegetarian pasta or spinach or wheat pasta is OK).    - Multi-grain bread is OK - like multi-grain flat bread or                                                                                          sandwich thins.   - Avoid alcohol in excess.   - Exercise is also important. <><><><><><><><><><><><><><><><><><><><><><><><><><><><><><><><><> <><><><><><><><><><><><><><><><><><><><><><><><><><><><><><><><><>  -  A1c = 7.1%  Now needs to be treated for Diabetes  - New Rx for  Metformin 2 tablets  2 x /day  ( with meals ) sent to drug store  <><><><><><><><><><><><><><><><><><><><><><><><><><><><><><><><><> <><><><><><><><><><><><><><><><><><><><><><><><><><><><><><><><><>  -  Vitamin D = 80  - Excellent  - Please continue dose same  <><><><><><><><><><><><><><><><><><><><><><><><><><><><><><><><><>  -  All Else - CBC - Kidneys - Electrolytes - Liver - Magnesium & Thyroid    - all  Normal / OK  <><><><><><><><><><><><><><><><><><><><><><><><><><><><><><><><><> <><><><><><><><><><><><><><><><><><><><><><><><><><><><><><><><><>

## 2022-07-03 LAB — CBC WITH DIFFERENTIAL/PLATELET
Absolute Monocytes: 340 cells/uL (ref 200–950)
Basophils Absolute: 21 cells/uL (ref 0–200)
Basophils Relative: 0.5 %
Eosinophils Absolute: 80 cells/uL (ref 15–500)
Eosinophils Relative: 1.9 %
HCT: 41.2 % (ref 38.5–50.0)
Hemoglobin: 13.7 g/dL (ref 13.2–17.1)
Lymphs Abs: 1344 cells/uL (ref 850–3900)
MCH: 29.5 pg (ref 27.0–33.0)
MCHC: 33.3 g/dL (ref 32.0–36.0)
MCV: 88.6 fL (ref 80.0–100.0)
MPV: 10.6 fL (ref 7.5–12.5)
Monocytes Relative: 8.1 %
Neutro Abs: 2415 cells/uL (ref 1500–7800)
Neutrophils Relative %: 57.5 %
Platelets: 253 10*3/uL (ref 140–400)
RBC: 4.65 10*6/uL (ref 4.20–5.80)
RDW: 14 % (ref 11.0–15.0)
Total Lymphocyte: 32 %
WBC: 4.2 10*3/uL (ref 3.8–10.8)

## 2022-07-03 LAB — URINALYSIS, ROUTINE W REFLEX MICROSCOPIC
Bacteria, UA: NONE SEEN /HPF
Bilirubin Urine: NEGATIVE
Glucose, UA: NEGATIVE
Hgb urine dipstick: NEGATIVE
Ketones, ur: NEGATIVE
Leukocytes,Ua: NEGATIVE
Nitrite: NEGATIVE
Specific Gravity, Urine: 1.015 (ref 1.001–1.035)
Squamous Epithelial / HPF: NONE SEEN /HPF (ref <=5)
WBC, UA: NONE SEEN /HPF (ref 0–5)
pH: 7.5 (ref 5.0–8.0)

## 2022-07-03 LAB — COMPLETE METABOLIC PANEL WITH GFR
AG Ratio: 1.3 (calc) (ref 1.0–2.5)
ALT: 17 U/L (ref 9–46)
AST: 13 U/L (ref 10–35)
Albumin: 4.2 g/dL (ref 3.6–5.1)
Alkaline phosphatase (APISO): 60 U/L (ref 35–144)
BUN/Creatinine Ratio: 12 (calc) (ref 6–22)
BUN: 15 mg/dL (ref 7–25)
CO2: 30 mmol/L (ref 20–32)
Calcium: 10.2 mg/dL (ref 8.6–10.3)
Chloride: 102 mmol/L (ref 98–110)
Creat: 1.29 mg/dL — ABNORMAL HIGH (ref 0.70–1.22)
Globulin: 3.2 g/dL (calc) (ref 1.9–3.7)
Glucose, Bld: 131 mg/dL — ABNORMAL HIGH (ref 65–99)
Potassium: 4.4 mmol/L (ref 3.5–5.3)
Sodium: 140 mmol/L (ref 135–146)
Total Bilirubin: 0.5 mg/dL (ref 0.2–1.2)
Total Protein: 7.4 g/dL (ref 6.1–8.1)
eGFR: 55 mL/min/{1.73_m2} — ABNORMAL LOW (ref >=60)

## 2022-07-03 LAB — MICROALBUMIN / CREATININE URINE RATIO
Creatinine, Urine: 133 mg/dL (ref 20–320)
Microalb Creat Ratio: 168 mg/g creat — ABNORMAL HIGH (ref <30)
Microalb, Ur: 22.4 mg/dL

## 2022-07-03 LAB — PSA: PSA: 12.34 ng/mL — ABNORMAL HIGH (ref <=4.00)

## 2022-07-03 LAB — LIPID PANEL
Cholesterol: 186 mg/dL (ref <200)
HDL: 35 mg/dL — ABNORMAL LOW (ref >=40)
LDL Cholesterol (Calc): 112 mg/dL (calc) — ABNORMAL HIGH
Non-HDL Cholesterol (Calc): 151 mg/dL (calc) — ABNORMAL HIGH (ref <130)
Total CHOL/HDL Ratio: 5.3 (calc) — ABNORMAL HIGH (ref <5.0)
Triglycerides: 270 mg/dL — ABNORMAL HIGH (ref <150)

## 2022-07-03 LAB — VITAMIN D 25 HYDROXY (VIT D DEFICIENCY, FRACTURES): Vit D, 25-Hydroxy: 80 ng/mL (ref 30–100)

## 2022-07-03 LAB — HEMOGLOBIN A1C
Hgb A1c MFr Bld: 7.1 % of total Hgb — ABNORMAL HIGH (ref <5.7)
Mean Plasma Glucose: 157 mg/dL
eAG (mmol/L): 8.7 mmol/L

## 2022-07-03 LAB — TSH: TSH: 4.15 mIU/L (ref 0.40–4.50)

## 2022-07-03 LAB — MAGNESIUM: Magnesium: 2.1 mg/dL (ref 1.5–2.5)

## 2022-07-03 LAB — MICROSCOPIC MESSAGE

## 2022-07-03 LAB — INSULIN, RANDOM: Insulin: 88.1 u[IU]/mL — ABNORMAL HIGH

## 2022-09-13 DIAGNOSIS — R3914 Feeling of incomplete bladder emptying: Secondary | ICD-10-CM | POA: Diagnosis not present

## 2022-09-13 DIAGNOSIS — N281 Cyst of kidney, acquired: Secondary | ICD-10-CM | POA: Diagnosis not present

## 2022-09-13 DIAGNOSIS — N401 Enlarged prostate with lower urinary tract symptoms: Secondary | ICD-10-CM | POA: Diagnosis not present

## 2022-10-04 ENCOUNTER — Ambulatory Visit: Payer: PPO | Admitting: Nurse Practitioner

## 2022-10-05 ENCOUNTER — Ambulatory Visit: Payer: PPO | Admitting: Nurse Practitioner

## 2022-10-05 NOTE — Progress Notes (Deleted)
3 MONTH OV AND MEDICARE ANNUAL WELLNESS VISIT  Assessment:   Bryan Morgan was seen today for diabetes management and medicare wellness.  Diagnoses and all orders for this visit:   1. Encounter for Medicare annual wellness exam Due Annually Declines all vaccines and medications Working on lifestyle, goals set   2. Mild hypertension Controlled  without medication. Discussed DASH (Dietary Approaches to Stop Hypertension) DASH diet is lower in sodium than a typical American diet. Cut back on foods that are high in saturated fat, cholesterol, and trans fats. Eat more whole-grain foods, fish, poultry, and nuts Remain active and exercise as tolerated daily.  Monitor BP at home-Call if greater than 130/80.   - CBC with Differential/Platelet - COMPLETE METABOLIC PANEL WITH GFR  3. Abdominal aortic aneurysm (AAA) without rupture, unspecified part (HCC) S/P EVAR 01/27/2021 for 72 mm infrarenal AAA. Last seen 09/20/2021-Continue survelience. Continue to follow with Vascular Q6M as recommended.  Last seen 09/20/2021  4. Gastroesophageal reflux disease without esophagitis Stable and well controlled via diet. Avoid triggers.  Avoid laying down after eating.  5. Hyperlipidemia due to type 2 diabetes mellitus (HCC) Uncontrolled. Unable to tolerate statin medication.   No longer wishes to pursue medications. Discussed lifestyle modifications. Recommended diet heavy in fruits and veggies, omega 3's. Decrease consumption of animal meats, cheeses, and dairy products. Remain active and exercise as tolerated. Continue to monitor.  - Lipid panel  6. Diet-controlled diabetes mellitus [facility]) Education: Reviewed 'ABCs' of diabetes management  A1C (<7) Blood pressure (<130/80) Cholesterol (LDL <70) Continue Eye Exam yearly  Continue Dental Exam Q6 mo Discussed dietary recommendations Discussed Physical Activity recommendations Foot exam UTD   - Hemoglobin A1c  7. CKD stage 2 due to type 2  diabetes mellitus (HCC) Discussed how what you eat and drink can aide in kidney protection. Stay well hydrated. Avoid high salt foods. Avoid NSAIDS. Keep BP and BG well controlled.   Take medications as prescribed. Remain active and exercise as tolerated daily. Maintain weight.  Continue to monitor.   8. ETD (Eustachian tube dysfunction), bilateral Continue to monitor   10. Benign prostatic hyperplasia with nocturia TURP Procedures 03/2021. Well managed. Continue to follow with Urology. Follow Up Q6M per request. - PSA  11. Overweight (BMI 25.0-29.9) Discussed appropriate BMI Goal of losing 1 lb per month. Diet modification. Physical activity. Encouraged/praised to build confidence.   12. Vitamin D deficiency Continue supplement.  - VITAMIN D 25 Hydroxy (Vit-D Deficiency, Fractures)  13.  History of adenomatous polyp of colon. Due for colonoscopy.  -Ambulatory referral to gastroenterology    14. Medication management All medications discussed and reviewed in full. All questions and concerns regarding medications addressed.    - CBC with Differential/Platelet - COMPLETE METABOLIC PANEL WITH GFR - Lipid panel - VITAMIN D 25 Hydroxy (Vit-D Deficiency, Fractures) - PSA - Hemoglobin A1c  Over 30 minutes of exam, counseling, chart review, and critical decision making was performed  Future Appointments  Date Time Provider Department Center  10/05/2022  9:30 AM [name], NP [facility] None  10/31/2022  9:00 AM [facility] - EM [facility]  10/31/2022 10:00 AM [name], MD [facility]  01/05/2023 10:30 AM [name], MD [facility] None  07/05/2023 11:00 AM [name], MD [facility] None  10/05/2023  9:30 AM [name], NP [facility] None     Plan:   During the course of the visit the patient was educated and counseled about appropriate screening and preventive services including:  Pneumococcal vaccine  Influenza  vaccine Prevnar 13 Td vaccine Screening electrocardiogram Colorectal cancer screening Diabetes screening Glaucoma screening Nutrition counseling    Subjective:  Bryan Morgan Morgan is a 82 y.o. male who presents for 3 month follow up and for Medicare Annual Wellness Visit.   He is new to our office in recent years. His wife comes to our office Bryan Morgan) - he is preveiously not well established with PCP, had gone to urgent care as needed.   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  Recently won the [employer].  He walks every day for 30 minutes.  He has a diagnosis of GERD which is currently managed by PRN tums with spicy foods. He reports symptoms is currently well controlled, and denies breakthrough reflux, burning in chest, hoarseness or cough.    He has a hx of adenomatous polyp of colon and is due for a colonoscopy.  BMI is There is no height or weight on file to calculate BMI., he bowls once a week, he reports he walks a lot in his neighborhood. He rides the bus and walks a lot for transportation due to vision, generally very active at work, walks at least 60+ min daily.  Wt Readings from Last 3 Encounters:  06/30/22 225 lb 9.6 oz (102.3 kg)  05/23/22 226 lb (102.5 kg)  02/03/22 226 lb 6.4 oz (102.7 kg)   His blood pressure has been controlled at home, today their BP is    He does workout. He denies chest pain, shortness of breath, dizziness.   He is not on cholesterol medication (adamantly declines medications). Had reaction to Rosuvastatin, unable to tolerate d/t reports of syncopal episode. His cholesterol is not at goal. The cholesterol last visit was:   Lab Results  Component Value Date   CHOL 186 06/30/2022   HDL 35 (L)  06/30/2022   LDLCALC 112 (H) 06/30/2022   TRIG 270 (H) 06/30/2022   CHOLHDL 5.3 (H) 06/30/2022    He has been working on diet and exercise for T2 diabetes managed by diet/lifestyle, he declines all medication management, and denies increased appetite, nausea, paresthesia of the feet, polydipsia, polyuria and vomiting. Last A1C in the office was:  Lab Results  Component Value Date   HGBA1C 7.1 (H) 06/30/2022   He has CKD II associated with T2DM monitored at this office:  Lab Results  Component Value Date   GFRAA 61 10/01/2020   Patient is on Vitamin D supplement.   Lab Results  Component Value Date   VD25OH 80 06/30/2022     BPH - minimal nocturia; He does have hx of PSA elevations and continues to follow with [facility].  Had TURP procedure 03/2021. Lab Results  Component Value Date   PSA 12.34 (H) 06/30/2022   PSA 10.03 (H) 10/03/2021   PSA 6.39 (H) 06/17/2021   Medication Review:  Current Outpatient Medications (Endocrine & Metabolic):    metFORMIN (GLUCOPHAGE-XR) 500 MG 24 hr tablet, Take 2 tablets 2 x /day with Meals for Diabetes.      Current Outpatient Medications (Other):    Cholecalciferol (VITAMIN D) 125 MCG (5000 UT) CAPS, Take 5,000 Units by mouth daily.   Magnesium 500 MG TABS, Take 500 mg by mouth daily.   tamsulosin (  FLOMAX) 0.4 MG CAPS capsule, Take 0.4 mg by mouth at bedtime.  Allergies: No Known Allergies  Current Problems (verified) has Overweight (BMI 25.0-29.9); Benign prostatic hyperplasia without lower urinary tract symptoms; Elevated PSA; Gastroesophageal reflux disease without esophagitis; Hyperlipidemia due to type 2 diabetes mellitus (HCC); Diabetes mellitus (HCC); Vitamin D deficiency; CKD stage 2 due to type 2 diabetes mellitus (HCC); History of adenomatous polyp of colon; Mild hypertension; Abnormal TSH; Legal blindness; AAA (abdominal aortic aneurysm) (HCC); BPH (benign prostatic hyperplasia); ETD (Eustachian tube dysfunction),  bilateral; and Hearing loss on their problem list.  Screening Tests Immunization History  Administered Date(s) Administered   Janssen (J&J) SARS-COV-2 Vaccination 08/16/2019, 04/02/2020   Preventative care: Last colonoscopy: 08/2018 after positive cologuard, precancerous polyps per Dr. Rhea Belton, 3 year follow up - Due   Prior vaccinations: TD or Tdap: never, declines today  Influenza: declines  Pneumococcal: declines Prevnar13: declines Shingles/Zostavax: declines Covid 19: J&J, 2021, 2022  Names of Other Physician/Practitioners you currently use: 1. [facility] here for primary care 2. Dr. Marland Kitchen [place] at [facility], eye doctor, last visit 2017, has glasses, no change in vision due to legally blind  3. Chandra Batch, dentist, has partials and bridges, last visit 2022, goes q105m  Patient Care Team: [name], MD as PCP - General (Internal Medicine)  Surgical: He  has a past surgical history that includes Cataract extraction, bilateral (2007); Abdominal aortic endovascular stent graft (N/A, 01/27/2021); Ultrasound guidance for vascular access (Bilateral, 01/27/2021); Transurethral resection of prostate (N/A, 03/29/2021); and Cystoscopy (N/A, 03/29/2021). Family His family history includes COPD in his brother; Diabetes in his sister and sister. Social history  He reports that he has never smoked. He has never been exposed to tobacco smoke. He has never used smokeless tobacco. He reports that he does not drink alcohol and does not use drugs.  MEDICARE WELLNESS OBJECTIVES: Physical activity:   Cardiac risk factors:   Depression/mood screen:      07/01/2022    8:44 PM  Depression screen PHQ 2/9  Decreased Interest 0  Down, Depressed, Hopeless 0  PHQ - 2 Score 0    ADLs:     07/01/2022    8:44 PM  In your present state of health, do you have any difficulty performing the following activities:  Hearing? 0  Vision? 1  Comment legally blind   Difficulty concentrating or making decisions? 0  Walking or climbing stairs? 0  Dressing or bathing? 0  Doing errands, shopping? 0      Cognitive Testing  Alert? Yes  Normal Appearance?Yes  Oriented to person? Yes  Place? Yes   Time? Yes  Recall of three objects?  Yes  Can perform simple calculations? Yes  Displays appropriate judgment?Yes  Can read the correct time from a watch face? N/a - very poor vision   EOL planning:     Objective:   There were no vitals filed for this visit.   There is no height or weight on file to calculate BMI.  General appearance: alert, no distress, WD/WN, male HEENT: normocephalic, sclerae anicteric, TMs pearly, nares patent, no discharge or erythema, pharynx normal, severely poor vision, R pupil is non-reactive/torn, horizontal nystagmus with attempts to focus Oral cavity: MMM, no lesions Neck: supple, no lymphadenopathy, no thyromegaly, no masses Heart: RRR, normal S1, S2, no murmurs Lungs: CTA bilaterally, no wheezes, rhonchi, or rales Abdomen: +bs, soft, non tender, non distended, no masses, no hepatomegaly, no splenomegaly Musculoskeletal: nontender, no swelling,  no obvious deformity Extremities: no edema, no cyanosis, no clubbing Pulses: 2+ symmetric, upper and lower extremities, normal cap refill Neurological: alert, oriented x 3, CN2-12 intact (excepting vision testing deferred), strength normal upper extremities and lower extremities, sensation normal throughout, DTRs 2+ throughout, no cerebellar signs, gait normal (slow steady)  Psychiatric: normal affect, behavior normal, pleasant  Skin: albino coloring without rashes or concerning lesions  Medicare Attestation I have personally reviewed: The patient's medical and social history Their use of alcohol, tobacco or illicit drugs Their current medications and supplements The patient's functional ability including ADLs,fall risks, home safety risks, cognitive, and hearing and visual  impairment Diet and physical activities Evidence for depression or mood disorders  The patient's weight, height, BMI, and visual acuity have been recorded in the chart.  I have made referrals, counseling, and provided education to the patient based on review of the above and I have provided the patient with a written personalized care plan for preventive services.     [name], NP   10/05/2022

## 2022-10-31 ENCOUNTER — Encounter: Payer: Self-pay | Admitting: Vascular Surgery

## 2022-10-31 ENCOUNTER — Ambulatory Visit (HOSPITAL_COMMUNITY)
Admission: RE | Admit: 2022-10-31 | Discharge: 2022-10-31 | Disposition: A | Discharge status: Home / Self Care | Payer: PPO | Source: Ambulatory Visit | Attending: Vascular Surgery | Admitting: Vascular Surgery

## 2022-10-31 ENCOUNTER — Ambulatory Visit: Payer: PPO | Admitting: Vascular Surgery

## 2022-10-31 VITALS — BP 167/87 | HR 63 | Temp 97.3°F | Ht 73.5 in | Wt 220.0 lb

## 2022-10-31 DIAGNOSIS — Z9889 Other specified postprocedural states: Secondary | ICD-10-CM | POA: Diagnosis not present

## 2022-10-31 DIAGNOSIS — I7143 Infrarenal abdominal aortic aneurysm, without rupture: Secondary | ICD-10-CM | POA: Diagnosis not present

## 2022-10-31 DIAGNOSIS — I714 Abdominal aortic aneurysm, without rupture, unspecified: Secondary | ICD-10-CM | POA: Insufficient documentation

## 2022-10-31 DIAGNOSIS — I9789 Other postprocedural complications and disorders of the circulatory system, not elsewhere classified: Secondary | ICD-10-CM

## 2022-10-31 DIAGNOSIS — Z8679 Personal history of other diseases of the circulatory system: Secondary | ICD-10-CM

## 2022-10-31 NOTE — Progress Notes (Unsigned)
[facility] [place] PROGRESS NOTE  ASSESSMENT / PLAN: Bryan Morgan is a 82 y.o. male status post EVAR 01/27/21 for 76mm infrarenal abdominal aortic aneurysm noted during workup for urinary retention. [name] is slowly growing 74mm. 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 Given slow growth on multiple imaging modalities, I will refer him to interventional radiology to discuss options for embolization. I will follow-up with patient with EVAR duplex in 6 months.  SUBJECTIVE: No complaints. Doing well overall  OBJECTIVE: BP (!) 167/87 (BP Location: Left Arm, Patient Position: Sitting, Cuff Size: Normal)   Pulse 63   Temp (!) 97.3 F (36.3 C)   Ht 6' 1.5" (1.867 m)   Wt 220 lb (99.8 kg)   SpO2 96%   BMI 28.63 kg/m   Constitutional: well appearing. no acute distress. CNS: normal gait and station Cardiac: RRR. Pulmonary: unlabored Abdomen: soft, not tender Vascular: 2+ DP pulses     Latest Ref Rng & Units 06/30/2022   10:10 AM 02/03/2022   12:00 AM 10/03/2021    9:56 AM  CBC  WBC 3.8 - 10.8 Thousand/uL 4.2  4.5  4.0   Hemoglobin 13.2 - 17.1 g/dL 40.9  81.1  91.4   Hematocrit 38.5 - 50.0 % 41.2  39.3  40.8   Platelets 140 - 400 Thousand/uL 253  232  246         Latest Ref Rng & Units 06/30/2022   10:10 AM 02/03/2022   12:00 AM 10/03/2021    9:56 AM  CMP  Glucose 65 - 99 mg/dL 782  956  213   BUN 7 - 25 mg/dL 15  19  15    Creatinine 0.70 - 1.22 mg/dL 0.86  5.78  4.69   Sodium 135 - 146 mmol/L 140  140  140   Potassium 3.5 - 5.3 mmol/L 4.4  4.1  4.8   Chloride 98 - 110 mmol/L 102  102  102   CO2 20 - 32 mmol/L 30  30  27    Calcium 8.6 - 10.3 mg/dL 62.9  52.8  9.7   Total Protein 6.1 - 8.1 g/dL 7.4  7.1  7.4   Total Bilirubin 0.2 - 1.2 mg/dL 0.5  0.4  0.4   AST 10 - 35 U/L 13  13  18    ALT 9 - 46 U/L 17  13  17      EVAR duplex shows  persistent endoleak. Sac diameter now 76mm.  Rande Brunt. Lenell Antu, MD [facility] Phone Number: [phone] 10/31/2022 9:27 AM

## 2022-11-01 ENCOUNTER — Encounter: Payer: Self-pay | Admitting: Nurse Practitioner

## 2022-11-01 ENCOUNTER — Ambulatory Visit (INDEPENDENT_AMBULATORY_CARE_PROVIDER_SITE_OTHER): Payer: PPO | Admitting: Nurse Practitioner

## 2022-11-01 VITALS — BP 138/76 | HR 66 | Temp 97.8°F | Ht 73.5 in | Wt 221.4 lb

## 2022-11-01 DIAGNOSIS — Z0001 Encounter for general adult medical examination with abnormal findings: Secondary | ICD-10-CM

## 2022-11-01 DIAGNOSIS — R351 Nocturia: Secondary | ICD-10-CM

## 2022-11-01 DIAGNOSIS — H6993 Unspecified Eustachian tube disorder, bilateral: Secondary | ICD-10-CM

## 2022-11-01 DIAGNOSIS — E119 Type 2 diabetes mellitus without complications: Secondary | ICD-10-CM

## 2022-11-01 DIAGNOSIS — N182 Chronic kidney disease, stage 2 (mild): Secondary | ICD-10-CM | POA: Diagnosis not present

## 2022-11-01 DIAGNOSIS — E1169 Type 2 diabetes mellitus with other specified complication: Secondary | ICD-10-CM | POA: Diagnosis not present

## 2022-11-01 DIAGNOSIS — R6889 Other general symptoms and signs: Secondary | ICD-10-CM

## 2022-11-01 DIAGNOSIS — E1122 Type 2 diabetes mellitus with diabetic chronic kidney disease: Secondary | ICD-10-CM | POA: Diagnosis not present

## 2022-11-01 DIAGNOSIS — N401 Enlarged prostate with lower urinary tract symptoms: Secondary | ICD-10-CM | POA: Diagnosis not present

## 2022-11-01 DIAGNOSIS — Z86010 Personal history of colonic polyps: Secondary | ICD-10-CM

## 2022-11-01 DIAGNOSIS — E559 Vitamin D deficiency, unspecified: Secondary | ICD-10-CM

## 2022-11-01 DIAGNOSIS — K219 Gastro-esophageal reflux disease without esophagitis: Secondary | ICD-10-CM

## 2022-11-01 DIAGNOSIS — Z0000 Encounter for general adult medical examination without abnormal findings: Secondary | ICD-10-CM

## 2022-11-01 DIAGNOSIS — E785 Hyperlipidemia, unspecified: Secondary | ICD-10-CM

## 2022-11-01 DIAGNOSIS — E663 Overweight: Secondary | ICD-10-CM

## 2022-11-01 DIAGNOSIS — Z79899 Other long term (current) drug therapy: Secondary | ICD-10-CM | POA: Diagnosis not present

## 2022-11-01 DIAGNOSIS — I714 Abdominal aortic aneurysm, without rupture, unspecified: Secondary | ICD-10-CM | POA: Diagnosis not present

## 2022-11-01 DIAGNOSIS — I10 Essential (primary) hypertension: Secondary | ICD-10-CM

## 2022-11-01 LAB — CBC WITH DIFFERENTIAL/PLATELET
Absolute Monocytes: 391 cells/uL (ref 200–950)
Basophils Absolute: 22 cells/uL (ref 0–200)
Basophils Relative: 0.5 %
Eosinophils Absolute: 90 cells/uL (ref 15–500)
Eosinophils Relative: 2.1 %
HCT: 39.7 % (ref 38.5–50.0)
Hemoglobin: 13.3 g/dL (ref 13.2–17.1)
Lymphs Abs: 1475 cells/uL (ref 850–3900)
MCH: 29.6 pg (ref 27.0–33.0)
MCHC: 33.5 g/dL (ref 32.0–36.0)
MCV: 88.2 fL (ref 80.0–100.0)
MPV: 10.7 fL (ref 7.5–12.5)
Monocytes Relative: 9.1 %
Neutro Abs: 2322 cells/uL (ref 1500–7800)
Neutrophils Relative %: 54 %
Platelets: 242 10*3/uL (ref 140–400)
RBC: 4.5 10*6/uL (ref 4.20–5.80)
RDW: 14 % (ref 11.0–15.0)
Total Lymphocyte: 34.3 %
WBC: 4.3 10*3/uL (ref 3.8–10.8)

## 2022-11-01 NOTE — Progress Notes (Signed)
3 MONTH OV AND MEDICARE ANNUAL WELLNESS VISIT  Assessment:   Tison was seen today for diabetes management and medicare wellness.  Diagnoses and all orders for this visit:  Encounter for Medicare annual wellness exam Due Annually Declines all vaccines and medications Working on lifestyle, goals set  Mild hypertension Controlled  without medication. Discussed DASH (Dietary Approaches to Stop Hypertension) DASH diet is lower in sodium than a typical American diet. Cut back on foods that are high in saturated fat, cholesterol, and trans fats. Eat more whole-grain foods, fish, poultry, and nuts Remain active and exercise as tolerated daily.  Monitor BP at home-Call if greater than 130/80.   Abdominal aortic aneurysm (AAA) without rupture, unspecified part (HCC) S/P EVAR 01/27/2021 for 72 mm infrarenal AAA. Last seen 09/20/2021-Continue survelience. Continue to follow with Vascular Q6M as recommended.  Gastroesophageal reflux disease without esophagitis No suspected reflux complications (Barret/stricture). Lifestyle modification:  wt loss, avoid meals 2-3h before bedtime. Consider eliminating food triggers:  chocolate, caffeine, EtOH, acid/spicy food.  Hyperlipidemia due to type 2 diabetes mellitus (HCC) Uncontrolled. Unable to tolerate statin medication.   No longer wishes to pursue medications. Discussed lifestyle modiedifications. Recommended diet heavy in fruits and veggies, omega 3's. Decrease consumption of animal meats, cheeses, and dairy products. Remain active and exercise as tolerated. Continue to monitor.  Diet-controlled diabetes mellitus [facility]) Education: Reviewed 'ABCs' of diabetes management  A1C (<7) Blood pressure (<130/80) Cholesterol (LDL <70) Continue Eye Exam yearly  Continue Dental Exam Q6 mo Discussed dietary recommendations Discussed Physical Activity recommendations Foot exam UTD   CKD stage 2 due to type 2 diabetes mellitus (HCC) Discussed how  what you eat and drink can aide in kidney protection. Stay well hydrated. Avoid high salt foods. Avoid NSAIDS. Keep BP and BG well controlled.   Take medications as prescribed. Remain active and exercise as tolerated daily. Maintain weight.  Continue to monitor.  ETD (Eustachian tube dysfunction), bilateral Continue to monitor  Benign prostatic hyperplasia with nocturia TURP Procedures 03/2021. Well managed. Continue to follow with Urology. Follow Up Q6M per request. Monitor PSA at CPE and through Urology  Overweight (BMI 25.0-29.9) Discussed appropriate BMI Diet modification. Physical activity. Encouraged/praised to build confidence.  Vitamin D deficiency Continue supplement for goal of 60-100 Monitor Vitamin D levels  History of adenomatous polyp of colon. Due for colonoscopy.  Medication management All medications discussed and reviewed in full. All questions and concerns regarding medications addressed.    Orders Placed This Encounter  Procedures   CBC with Differential/Platelet   COMPLETE METABOLIC PANEL WITH GFR   Lipid panel   Hemoglobin A1c   Ambulatory referral to Gastroenterology    Referral Priority:   Routine    Referral Type:   Consultation    Referral Reason:   Specialty Services Required    Referred to Provider:   Beverley Fiedler, MD    Number of Visits Requested:   1    Notify office for further evaluation and treatment, questions or concerns if any reported s/s fail to improve.   The patient was advised to call back or seek an in-person evaluation if any symptoms worsen or if the condition fails to improve as anticipated.   Further disposition pending results of labs. Discussed med's effects and SE's.    I discussed the assessment and treatment plan with the patient. The patient was provided an opportunity to ask questions and all were answered. The patient agreed with the plan and demonstrated an understanding of the  instructions.  Discussed  med's effects and SE's. Screening labs and tests as requested with regular follow-up as recommended.  I provided 35 minutes of face-to-face time during this encounter including counseling, chart review, and critical decision making was preformed.  Today's Plan of Care is based on a patient-centered health care approach known as shared decision making - the decisions, tests and treatments allow for patient preferences and values to be balanced with clinical evidence.    Future Appointments  Date Time Provider Department Center  02/19/2023 10:30 AM [name], MD [facility] None  05/01/2023  9:00 AM [facility] 6 [facility]  05/01/2023 10:00 AM [name], MD [facility]  07/05/2023 11:00 AM [name], MD [facility] None  11/01/2023 11:30 AM [name], NP [facility] None     Plan:   During the course of the visit the patient was educated and counseled about appropriate screening and preventive services including:   Pneumococcal vaccine  Influenza vaccine Prevnar 13 Td vaccine Screening electrocardiogram Colorectal cancer screening Diabetes screening Glaucoma screening Nutrition counseling    Subjective:  Bryan Morgan is a 82 y.o. male who presents for 3 month follow up and for Medicare Annual Wellness Visit.   Overall he reports feeling well today.  He has no new concerns at this time.    Wife is also a patient at our office Bryan Morgan).  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  Recently won the [employer].    He has a diagnosis of GERD which is currently managed by PRN tums with spicy foods. He reports symptoms is currently well controlled, and denies breakthrough reflux, burning in chest, hoarseness or  cough.    He has a hx of adenomatous polyp of colon and is due for a colonoscopy.  Referral placed last year but did not follow through.  BMI is Body mass index is 28.81 kg/m., he bowls once a week, he reports he walks a lot in his neighborhood. He rides the bus and walks a lot for transportation due to vision, generally very active at work, walks at least 60+ min daily. He walks every day for 30 minutes. Wt Readings from Last 3 Encounters:  11/01/22 221 lb 6.4 oz (100.4 kg)  10/31/22 220 lb (99.8 kg)  06/30/22 225 lb 9.6 oz (102.3 kg)   His blood pressure has been controlled at home, today their BP is BP: 138/76  He does workout. He denies chest pain, shortness of breath, dizziness.   He is not on cholesterol medication (adamantly declines medications). Had reaction to Rosuvastatin, unable to tolerate d/t reports of syncopal episode. His cholesterol is not at goal. The cholesterol last visit was:   Lab Results  Component Value Date   CHOL 186 06/30/2022   HDL 35 (L) 06/30/2022   LDLCALC 112 (H) 06/30/2022   TRIG 270 (H) 06/30/2022   CHOLHDL 5.3 (H) 06/30/2022    He has been working on diet and exercise for T2 diabetes managed by diet/lifestyle, he declines all medication management, and denies increased appetite, nausea, paresthesia of the feet, polydipsia, polyuria and vomiting. Last A1C in the office was:  Lab Results  Component Value Date   HGBA1C 7.1 (H) 06/30/2022   He has CKD II associated with T2DM  monitored at this office:  Lab Results  Component Value Date   GFRAA 61 10/01/2020   Patient is on Vitamin D supplement.   Lab Results  Component Value Date   VD25OH 80 06/30/2022     BPH - minimal nocturia; He does have hx of PSA elevations and continues to follow with [facility].  Had TURP procedure 03/2021.  Recently saw in 08/2022 and plans to f/u 02/2023 Lab Results  Component Value Date   PSA 12.34 (H) 06/30/2022   PSA 10.03 (H) 10/03/2021   PSA 6.39 (H)  06/17/2021   Medication Review:  Current Outpatient Medications (Endocrine & Metabolic):    metFORMIN (GLUCOPHAGE-XR) 500 MG 24 hr tablet, Take 2 tablets 2 x /day with Meals for Diabetes. (Patient not taking: Reported on 11/01/2022)      Current Outpatient Medications (Other):    Cholecalciferol (VITAMIN D) 125 MCG (5000 UT) CAPS, Take 5,000 Units by mouth daily.   Magnesium 500 MG TABS, Take 500 mg by mouth daily.   tamsulosin (FLOMAX) 0.4 MG CAPS capsule, Take 0.4 mg by mouth at bedtime.  Allergies: No Known Allergies  Current Problems (verified) has Overweight (BMI 25.0-29.9); Benign prostatic hyperplasia without lower urinary tract symptoms; Elevated PSA; Gastroesophageal reflux disease without esophagitis; Hyperlipidemia due to type 2 diabetes mellitus (HCC); Diabetes mellitus (HCC); Vitamin D deficiency; CKD stage 2 due to type 2 diabetes mellitus (HCC); History of adenomatous polyp of colon; Mild hypertension; Abnormal TSH; Legal blindness; AAA (abdominal aortic aneurysm) (HCC); BPH (benign prostatic hyperplasia); ETD (Eustachian tube dysfunction), bilateral; and Hearing loss on their problem list.  Screening Tests Immunization History  Administered Date(s) Administered   Janssen (J&J) SARS-COV-2 Vaccination 08/16/2019, 04/02/2020   Preventative care: Last colonoscopy: 08/2018 after positive cologuard, precancerous polyps per Dr. Rhea Belton, 3 year follow up - Due   Prior vaccinations: TD or Tdap: never, declines today  Influenza: declines  Pneumococcal: declines Prevnar13: declines Shingles/Zostavax: declines Covid 19: J&J, 2021, 2022  Names of Other Physician/Practitioners you currently use: 1. [facility] here for primary care 2. [facility] [place] [facility], eye doctor, last visit 2017, has glasses, no change in vision due to legally blind  3. Chandra Batch, dentist, has partials and bridges, last visit 2024, goes q63m  Patient Care  Team: [name], MD as PCP - General (Internal Medicine)  Surgical: He  has a past surgical history that includes Cataract extraction, bilateral (2007); Abdominal aortic endovascular stent graft (N/A, 01/27/2021); Ultrasound guidance for vascular access (Bilateral, 01/27/2021); Transurethral resection of prostate (N/A, 03/29/2021); and Cystoscopy (N/A, 03/29/2021). Family His family history includes COPD in his brother; Diabetes in his sister and sister. Social history  He reports that he has never smoked. He has never been exposed to tobacco smoke. He has never used smokeless tobacco. He reports that he does not drink alcohol and does not use drugs.  MEDICARE WELLNESS OBJECTIVES: Physical activity: Current Exercise Habits: Home exercise routine Cardiac risk factors:   Depression/mood screen:      11/01/2022   11:20 AM  Depression screen PHQ 2/9  Decreased Interest 0  Down, Depressed, Hopeless 0  PHQ - 2 Score 0    ADLs:     11/01/2022   11:20 AM 07/01/2022    8:44 PM  In your present state of health, do you have any difficulty performing the following activities:  Hearing? 1 0  Vision? 1 1  Comment  legally blind  Difficulty concentrating or making decisions? 0  0  Walking or climbing stairs? 0 0  Dressing or bathing? 0 0  Doing errands, shopping? 0 0      Cognitive Testing  Alert? Yes  Normal Appearance?Yes  Oriented to person? Yes  Place? Yes   Time? Yes  Recall of three objects?  Yes  Can perform simple calculations? Yes  Displays appropriate judgment?Yes  Can read the correct time from a watch face? N/a - very poor vision   EOL planning: Does Patient Have a Medical Advance Directive?: Yes Type of Advance Directive: Living will   Objective:   Today's Vitals   11/01/22 1011  BP: 138/76  Pulse: 66  Temp: 97.8 F (36.6 C)  SpO2: 99%  Weight: 221 lb 6.4 oz (100.4 kg)  Height: 6' 1.5" (1.867 m)     Body mass index is 28.81 kg/m.  General appearance: alert,  no distress, WD/WN, male HEENT: normocephalic, sclerae anicteric, TMs pearly, nares patent, no discharge or erythema, pharynx normal, severely poor vision, R pupil is non-reactive/torn, horizontal nystagmus with attempts to focus Oral cavity: MMM, no lesions Neck: supple, no lymphadenopathy, no thyromegaly, no masses Heart: RRR, normal S1, S2, no murmurs Lungs: CTA bilaterally, no wheezes, rhonchi, or rales Abdomen: +bs, soft, non tender, non distended, no masses, no hepatomegaly, no splenomegaly Musculoskeletal: nontender, no swelling, no obvious deformity Extremities: no edema, no cyanosis, no clubbing Pulses: 2+ symmetric, upper and lower extremities, normal cap refill Neurological: alert, oriented x 3, CN2-12 intact (excepting vision testing deferred), strength normal upper extremities and lower extremities, sensation normal throughout, DTRs 2+ throughout, no cerebellar signs, gait normal (slow steady)  Psychiatric: normal affect, behavior normal, pleasant  Skin: albino coloring without rashes or concerning lesions  Medicare Attestation I have personally reviewed: The patient's medical and social history Their use of alcohol, tobacco or illicit drugs Their current medications and supplements The patient's functional ability including ADLs,fall risks, home safety risks, cognitive, and hearing and visual impairment Diet and physical activities Evidence for depression or mood disorders  The patient's weight, height, BMI, and visual acuity have been recorded in the chart.  I have made referrals, counseling, and provided education to the patient based on review of the above and I have provided the patient with a written personalized care plan for preventive services.     [name], NP   11/01/2022

## 2022-11-01 NOTE — Patient Instructions (Signed)

## 2022-11-07 ENCOUNTER — Other Ambulatory Visit: Payer: Self-pay

## 2022-11-07 DIAGNOSIS — I7143 Infrarenal abdominal aortic aneurysm, without rupture: Secondary | ICD-10-CM

## 2022-11-09 ENCOUNTER — Other Ambulatory Visit: Payer: Self-pay | Admitting: Vascular Surgery

## 2022-11-09 DIAGNOSIS — I9789 Other postprocedural complications and disorders of the circulatory system, not elsewhere classified: Secondary | ICD-10-CM

## 2022-11-17 ENCOUNTER — Other Ambulatory Visit: Payer: Self-pay | Admitting: Vascular Surgery

## 2022-11-17 ENCOUNTER — Ambulatory Visit: Admission: RE | Admit: 2022-11-17 | Payer: PPO | Source: Ambulatory Visit

## 2022-11-17 ENCOUNTER — Other Ambulatory Visit: Payer: Self-pay | Admitting: Interventional Radiology

## 2022-11-17 DIAGNOSIS — I9789 Other postprocedural complications and disorders of the circulatory system, not elsewhere classified: Secondary | ICD-10-CM

## 2022-11-17 HISTORY — PX: IR RADIOLOGIST EVAL & MGMT: IMG5224

## 2022-11-17 NOTE — Progress Notes (Signed)
Reason for visit: Endoleak evaluation, referred by [name], MD  Care Team(s): Primary Care; [name], MD Vascular Surgery; [name], MD  History of Present Illness:  Bryan Morgan is a 82 y.o. male w Hx of albinism, T2DM, CKDII, and BPH who was found to have a 7cm AAA during workup for urinary retention. Pt underwent EVAR w Dr Lenell Antu in 01/27/21 with a type 2 endoleak noted at completion angiogram, initial post stent CTA on 02/18/21 and on surveillance in 09/16/21. His excluded sac size measures up to 7.6 cm and has not demonstrated enlargement on CTA or AAA Duplex US, most recently on 10/31/22.  The patient is asymptomatic and denies any discomfort on today's evaluation.  Review of Systems: A 12-point ROS discussed, and pertinent positives are indicated in the HPI above.  All other systems are negative.   Past Medical History:  Diagnosis Date   AAA (abdominal aortic aneurysm) (HCC)    BPH (benign prostatic hyperplasia)    with retention, s/p foley catheter placement in [facility] ED 12/29/20   History of kidney stones    History of needle biopsy of prostate with negative result    Legal blindness    Positive colorectal cancer screening using Cologuard test 08/20/2018   08/18/2018 - GI referral placed; tubular & tubulovillous adenomas 09/12/18 colonoscopy, 3 year f/u rec   Pre-diabetes 06/04/2020    Past Surgical History:  Procedure Laterality Date   ABDOMINAL AORTIC ENDOVASCULAR STENT GRAFT N/A 01/27/2021   Procedure: ABDOMINAL AORTIC ENDOVASCULAR STENT GRAFT REPAIR;  Surgeon: [name], MD;  [facility];  Service: Vascular;  Laterality: N/A;   CATARACT EXTRACTION, BILATERAL  2007   CYSTOSCOPY N/A 03/29/2021   Procedure: CYSTOSCOPY;  Surgeon: Belva Agee, MD;  Location: [facility] ORS;  Service: Urology;  Laterality: N/A;   IR RADIOLOGIST EVAL & MGMT  11/17/2022   TRANSURETHRAL RESECTION OF PROSTATE N/A 03/29/2021   Procedure: TRANSURETHRAL RESECTION  OF THE PROSTATE (TURP);  Surgeon: Belva Agee, MD;  Location: [facility] ORS;  Service: Urology;  Laterality: N/A;   ULTRASOUND GUIDANCE FOR VASCULAR ACCESS Bilateral 01/27/2021   Procedure: ULTRASOUND GUIDANCE FOR VASCULAR ACCESS, BILATERAL FEMORAL ARTERIES;  Surgeon: [name], MD;  Location: [facility] OR;  Service: Vascular;  Laterality: Bilateral;    Allergies: Patient has no known allergies.  Medications: Prior to Admission medications   Medication Sig Start Date End Date Taking? Authorizing Provider  Cholecalciferol (VITAMIN D) 125 MCG (5000 UT) CAPS Take 5,000 Units by mouth daily.    [provider]  Magnesium 500 MG TABS Take 500 mg by mouth daily.    [provider]  metFORMIN (GLUCOPHAGE-XR) 500 MG 24 hr tablet Take 2 tablets 2 x /day with Meals for Diabetes. Patient not taking: Reported on 11/01/2022 07/01/22   [name], MD  tamsulosin (FLOMAX) 0.4 MG CAPS capsule Take 0.4 mg by mouth at bedtime. 04/07/22   [provider]     Family History  Problem Relation Age of Onset   Diabetes Sister    COPD Brother    Diabetes Sister    Colon cancer Neg Hx    Esophageal cancer Neg Hx    Stomach cancer Neg Hx    Pancreatic cancer Neg Hx    Liver disease Neg Hx     Social History   Socioeconomic History   Marital status: Married    Spouse name: Not on file   Number of children: 4   Years of education:  Not on file   Highest education level: Not on file  Occupational History   Not on file  Tobacco Use   Smoking status: Never    Passive exposure: Never   Smokeless tobacco: Never  Vaping Use   Vaping status: Never Used  Substance and Sexual Activity   Alcohol use: No    Comment: rare, 1 drink twice a year   Drug use: No   Sexual activity: Yes    Partners: Female  Other Topics Concern   Not on file  Social History Narrative   Not on file   Social Determinants of Health   Financial Resource Strain: Not on file  Food Insecurity: Not on  file  Transportation Needs: Not on file  Physical Activity: Insufficiently Active (08/05/2018)   Exercise Vital Sign    Days of Exercise per Week: 1 day    Minutes of Exercise per Session: 120 min  Stress: No Stress Concern Present (08/05/2018)   [employer] of Occupational Health - Occupational Stress Questionnaire    Feeling of Stress : Only a little  Social Connections: Unknown (08/09/2021)   Received from [facility]   Social Network    Social Network: Not on file    Review of Systems As above  Vital Signs: BP (!) 159/73 (BP Location: Right Arm, Patient Position: Sitting, Cuff Size: Normal)   Pulse 70   Temp 97.9 F (36.6 C) (Oral)   Resp 14   SpO2 95%   Physical Exam  General: WN, NAD  CV: RRR on monitor Pulm: normal work of breathing on RA Abd: S, ND, NT MSK: Grossly normal Psych: Appropriate affect.  Imaging:  CTA AP, 09/17/22 Images independently reviewed, demonstrating a type 2 endoleak, suspected at IMA origin.       VAS [place] AAA DUPLEX  Result Date: 11/01/2022 Endovascular Aortic Repair Study (EVAR) Patient Name:  Bryan Morgan  Date of Exam:   10/31/2022 Medical Rec #: [id number]            Accession #:    [id number] Date of Birth: [birth year]            Patient Gender: M Patient Age:   41 years Exam Location:  [facility] Procedure:      VAS [place] AAA DUPLEX Referring Phys: Heath Lark --------------------------------------------------------------------------------  Indications: Follow up exam for EVAR. Limitations: Air/bowel gas and obesity.  Comparison Study: 05/23/2022 Performing Technologist: Dorthula Matas RVS, RCS  Examination Guidelines: A complete evaluation includes B-mode imaging, spectral Doppler, color Doppler, and power Doppler as needed of all accessible portions of each vessel. Bilateral testing is considered an integral part of a complete examination. Limited examinations for reoccurring indications may be performed as  noted.  Endovascular Aortic Repair (EVAR): +----------+----------------+-------------------+--------------+           Diameter AP (cm)Velocities (cm/sec)Comments       +----------+----------------+-------------------+--------------+ Aorta     7.55            103                               +----------+----------------+-------------------+--------------+ Right Limb                                   not visualized +----------+----------------+-------------------+--------------+ Left Limb  not visualized +----------+----------------+-------------------+--------------+  Summary: Abdominal Aorta: Patent endovascular aneurysm repair with evidence of endoleak. The largest aortic diameter remains essentially unchanged compared to prior exam. Previous diameter measurement was 7.6 cm obtained on 05/23/2022.  *See table(s) above for measurements and observations.  Electronically signed by Heath Lark on 11/01/2022 at 4:03:30 PM.    Final     Labs:  CBC: Recent Labs    02/03/22 0000 06/30/22 1010 11/01/22 1104  WBC 4.5 4.2 4.3  HGB 13.2 13.7 13.3  HCT 39.3 41.2 39.7  PLT 232 253 242    COAGS: No results for input(s): "INR", "APTT" in the last 8760 hours.  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  No enlargement of his excluded sac, measuring up to 7.6 cm on CTA or AAA Duplex US, most recently on 10/31/22.  Pt is interested in pursuing a minimally-invasive option for the treatment of his persistent endoleak at this time, and we  discussed potential options including intra-arterial vs percutaneous approaches. I recommended PERCUTANEOUS ENDOLEAK REPAIR.  The procedure has been fully reviewed with the patient/patient's authorized representative. The risks, benefits and alternatives have been explained, and the patient/patient's authorized representative has consented to the procedure.   *CTAs performed and reviewed. Request additional updated cross-sectional imaging with CTA AP, post EVAR protocol. *Procedure to be performed at [facility] with CT-guidance, under moderate sedation. *Proceed to schedule based on mutual availability. *Coag blood draw on the day of the procedure *Same day procedure, no overnight admission. *Ancef for pre op Abx.    Thank you for this interesting consult.  I greatly enjoyed meeting Bryan Morgan and look forward to participating in their care.  A copy of this report was sent to the requesting provider on this date.  Electronically Signed:  Roanna Banning, MD [facility]   Pager. [phone] Clinic. [phone]  I spent a total of  60 Minutes in face to face in clinical consultation, greater than 50% of which was counseling/coordinating care for Mr Vale Paley Kijowski's endoleak s/p aortic aneurysm repair.

## 2022-11-20 ENCOUNTER — Ambulatory Visit
Admission: RE | Admit: 2022-11-20 | Discharge: 2022-11-20 | Disposition: A | Discharge status: Home / Self Care | Payer: PPO | Source: Ambulatory Visit | Attending: Vascular Surgery | Admitting: Vascular Surgery

## 2022-11-20 DIAGNOSIS — I9789 Other postprocedural complications and disorders of the circulatory system, not elsewhere classified: Secondary | ICD-10-CM | POA: Diagnosis not present

## 2022-11-20 DIAGNOSIS — I700 Atherosclerosis of aorta: Secondary | ICD-10-CM | POA: Diagnosis not present

## 2022-11-20 MED ORDER — IOPAMIDOL (ISOVUE-370) INJECTION 76%
100.0000 mL | Freq: Once | INTRAVENOUS | Status: AC | PRN
  Administered 2022-11-20: 100 mL via INTRAVENOUS

## 2022-11-22 ENCOUNTER — Other Ambulatory Visit (HOSPITAL_COMMUNITY): Payer: Self-pay | Admitting: Interventional Radiology

## 2022-11-22 DIAGNOSIS — I714 Abdominal aortic aneurysm, without rupture, unspecified: Secondary | ICD-10-CM

## 2022-11-29 ENCOUNTER — Other Ambulatory Visit: Payer: Self-pay | Admitting: Radiology

## 2022-11-29 DIAGNOSIS — I9789 Other postprocedural complications and disorders of the circulatory system, not elsewhere classified: Secondary | ICD-10-CM

## 2022-11-30 ENCOUNTER — Ambulatory Visit (HOSPITAL_COMMUNITY)
Admission: RE | Admit: 2022-11-30 | Discharge: 2022-11-30 | Disposition: A | Discharge status: Home / Self Care | Payer: PPO | Source: Ambulatory Visit | Attending: Interventional Radiology | Admitting: Interventional Radiology

## 2022-11-30 ENCOUNTER — Other Ambulatory Visit (HOSPITAL_COMMUNITY): Payer: Self-pay | Admitting: Interventional Radiology

## 2022-11-30 ENCOUNTER — Other Ambulatory Visit: Payer: Self-pay

## 2022-11-30 ENCOUNTER — Encounter (HOSPITAL_COMMUNITY): Payer: Self-pay

## 2022-11-30 DIAGNOSIS — I714 Abdominal aortic aneurysm, without rupture, unspecified: Secondary | ICD-10-CM

## 2022-11-30 DIAGNOSIS — I9789 Other postprocedural complications and disorders of the circulatory system, not elsewhere classified: Secondary | ICD-10-CM

## 2022-11-30 DIAGNOSIS — Y838 Other surgical procedures as the cause of abnormal reaction of the patient, or of later complication, without mention of misadventure at the time of the procedure: Secondary | ICD-10-CM | POA: Insufficient documentation

## 2022-11-30 DIAGNOSIS — T82330A Leakage of aortic (bifurcation) graft (replacement), initial encounter: Secondary | ICD-10-CM | POA: Diagnosis not present

## 2022-11-30 DIAGNOSIS — Z8679 Personal history of other diseases of the circulatory system: Secondary | ICD-10-CM | POA: Diagnosis not present

## 2022-11-30 LAB — BASIC METABOLIC PANEL
Anion gap: 8 (ref 5–15)
BUN: 15 mg/dL (ref 8–23)
CO2: 27 mmol/L (ref 22–32)
Calcium: 9.6 mg/dL (ref 8.9–10.3)
Chloride: 100 mmol/L (ref 98–111)
Creatinine, Ser: 1.31 mg/dL — ABNORMAL HIGH (ref 0.61–1.24)
GFR, Estimated: 54 mL/min — ABNORMAL LOW (ref >60)
Glucose, Bld: 101 mg/dL — ABNORMAL HIGH (ref 70–99)
Potassium: 4.3 mmol/L (ref 3.5–5.1)
Sodium: 135 mmol/L (ref 135–145)

## 2022-11-30 LAB — CBC
HCT: 43.6 % (ref 39.0–52.0)
Hemoglobin: 13.8 g/dL (ref 13.0–17.0)
MCH: 29.7 pg (ref 26.0–34.0)
MCHC: 31.7 g/dL (ref 30.0–36.0)
MCV: 93.8 fL (ref 80.0–100.0)
Platelets: 238 10*3/uL (ref 150–400)
RBC: 4.65 MIL/uL (ref 4.22–5.81)
RDW: 14 % (ref 11.5–15.5)
WBC: 4.6 10*3/uL (ref 4.0–10.5)
nRBC: 0 % (ref 0.0–0.2)

## 2022-11-30 LAB — GLUCOSE, CAPILLARY: Glucose-Capillary: 85 mg/dL (ref 70–99)

## 2022-11-30 LAB — PROTIME-INR
INR: 1 (ref 0.8–1.2)
Prothrombin Time: 13.2 s (ref 11.4–15.2)

## 2022-11-30 MED ORDER — LIDOCAINE-EPINEPHRINE 1 %-1:100000 IJ SOLN
20.0000 mL | Freq: Once | INTRAMUSCULAR | Status: AC
  Administered 2022-11-30: 20 mL via INTRADERMAL

## 2022-11-30 MED ORDER — LIDOCAINE-EPINEPHRINE 1 %-1:100000 IJ SOLN
INTRAMUSCULAR | Status: AC
  Filled 2022-11-30: qty 1

## 2022-11-30 MED ORDER — SODIUM CHLORIDE 0.9 % IV SOLN: INTRAVENOUS | Status: DC

## 2022-11-30 MED ORDER — IOHEXOL 300 MG/ML  SOLN
50.0000 mL | Freq: Once | INTRAMUSCULAR | Status: AC | PRN
  Administered 2022-11-30: 10 mL via INTRA_ARTERIAL

## 2022-11-30 MED ORDER — CEFAZOLIN SODIUM-DEXTROSE 2-4 GM/100ML-% IV SOLN
INTRAVENOUS | Status: AC | PRN
Start: 2022-11-30 — End: 2022-11-30
  Administered 2022-11-30: 2 g via INTRAVENOUS

## 2022-11-30 MED ORDER — FENTANYL CITRATE (PF) 100 MCG/2ML IJ SOLN
INTRAMUSCULAR | Status: AC | PRN
  Administered 2022-11-30: 50 ug via INTRAVENOUS
  Administered 2022-11-30 (×3): 25 ug via INTRAVENOUS

## 2022-11-30 MED ORDER — FENTANYL CITRATE (PF) 100 MCG/2ML IJ SOLN
INTRAMUSCULAR | Status: AC
  Filled 2022-11-30: qty 4

## 2022-11-30 MED ORDER — MIDAZOLAM HCL 2 MG/2ML IJ SOLN
INTRAMUSCULAR | Status: AC
  Filled 2022-11-30: qty 4

## 2022-11-30 MED ORDER — CEFAZOLIN SODIUM-DEXTROSE 2-4 GM/100ML-% IV SOLN
INTRAVENOUS | Status: AC
  Filled 2022-11-30: qty 100

## 2022-11-30 MED ORDER — MIDAZOLAM HCL 2 MG/2ML IJ SOLN
INTRAMUSCULAR | Status: AC
  Filled 2022-11-30: qty 2

## 2022-11-30 MED ORDER — MIDAZOLAM HCL 2 MG/2ML IJ SOLN
INTRAMUSCULAR | Status: AC | PRN
Start: 2022-11-30 — End: 2022-11-30
  Administered 2022-11-30: .5 mg via INTRAVENOUS
  Administered 2022-11-30: 1 mg via INTRAVENOUS
  Administered 2022-11-30: .5 mg via INTRAVENOUS
  Administered 2022-11-30: 1 mg via INTRAVENOUS
  Administered 2022-11-30 (×4): .5 mg via INTRAVENOUS

## 2022-11-30 NOTE — Procedures (Signed)
Vascular and Interventional Radiology Procedure Note  Patient: Bryan Morgan DOB: February 04, 1941 Medical Record Number: [id number] Note Date/Time: 11/30/22 5:22 PM   Performing Physician: Roanna Banning, MD Assistant(s): None  Diagnosis: Hx AAA s/p EVAR. Type II Endoleak on follow up imaging.   Procedure: PERCUTANEOUS ENDOLEAK REPAIR   Anesthesia: Conscious Sedation Complications: None Estimated Blood Loss:  0 mL Specimens: Sent for None   Findings:  Successful CT-guided percutaneous endoleak repair, by a bilateral posterior translumbar approach. 3.0 mL of 4:1 ratio of Lipiodol and n-BCA glue was administed Hemostasis of the tract was achieved using Manual Pressure.   Plan: Bed rest for 1 hours.  See detailed procedure note with images in PACS. The patient tolerated the procedure well without incident or complication and was returned to Recovery in stable condition.    Roanna Banning, MD [facility]   Pager. [phone] Clinic. [phone]

## 2022-11-30 NOTE — Consult Note (Addendum)
Chief Complaint: Patient was seen in consultation today for percutaneous endoleak repair at the request of Dr Lelan Pons   Supervising Physician: Roanna Banning  Patient Status: [facility] - Out-pt  History of Present Illness: Bryan Morgan is a 82 y.o. male   FULL Code status per pt DM; CKD2; BPH 7 cm AAA EVAR with Dr Lelan Pons 2022 (Endovascular aneurysm repair) His excluded sac size measures up to 7.6 cm and has not demonstrated enlargement on CTA or AAA Duplex US, most recently on 10/31/22.   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 No enlargement of his excluded sac, measuring up to 7.6 cm on CTA or AAA Duplex US, most recently on 10/31/22.  Pt is interested in pursuing a minimally-invasive option for the treatment of his persistent endoleak at this time, and we discussed potential options including intra-arterial vs percutaneous approaches. I recommended PERCUTANEOUS ENDOLEAK REPAIR.   The procedure has been fully reviewed with the patient/patient's authorized representative. The risks, benefits and alternatives have been explained, and the patient/patient's authorized representative has consented to the procedure.  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/12/18 colonoscopy, 3 year f/u rec   Pre-diabetes 06/04/2020    Past Surgical History:  Procedure  Laterality Date   ABDOMINAL AORTIC ENDOVASCULAR STENT GRAFT N/A 01/27/2021   Procedure: ABDOMINAL AORTIC ENDOVASCULAR STENT GRAFT REPAIR;  Surgeon: [name], MD;  Location: [facility] OR;  Service: Vascular;  Laterality: N/A;   CATARACT EXTRACTION, BILATERAL  2007   CYSTOSCOPY N/A 03/29/2021   Procedure: CYSTOSCOPY;  Surgeon: Belva Agee, MD;  Location: [facility] ORS;  Service: Urology;  Laterality: N/A;   IR RADIOLOGIST EVAL & MGMT  11/17/2022   TRANSURETHRAL RESECTION OF PROSTATE N/A 03/29/2021   Procedure: TRANSURETHRAL RESECTION OF THE PROSTATE (TURP);  Surgeon: Belva Agee, MD;  Location: [facility] ORS;  Service: Urology;  Laterality: N/A;   ULTRASOUND GUIDANCE FOR VASCULAR ACCESS Bilateral 01/27/2021   Procedure: ULTRASOUND GUIDANCE FOR VASCULAR ACCESS, BILATERAL FEMORAL ARTERIES;  Surgeon: [name], MD;  Location: [facility] OR;  Service: Vascular;  Laterality: Bilateral;    Allergies: Patient has no known allergies.  Medications: Prior to Admission medications   Medication Sig Start Date End Date Taking? Authorizing Provider  Cholecalciferol (VITAMIN D) 125 MCG (5000 UT) CAPS Take 5,000 Units by mouth daily.   Yes [provider]  Magnesium 500 MG TABS Take 500 mg by mouth daily.   Yes [provider]  tamsulosin (FLOMAX) 0.4 MG CAPS capsule Take 0.4 mg by mouth at bedtime. 04/07/22  Yes [provider]  metFORMIN (GLUCOPHAGE-XR) 500 MG 24 hr tablet Take 2 tablets 2 x /day with Meals for Diabetes. Patient not taking: Reported on 11/01/2022 07/01/22   [name], MD     Family History  Problem Relation Age of Onset   Diabetes Sister    COPD  Brother    Diabetes Sister    Colon cancer Neg Hx    Esophageal cancer Neg Hx    Stomach cancer Neg Hx    Pancreatic cancer Neg Hx    Liver disease Neg Hx     Social History   Socioeconomic History   Marital status: Married    Spouse name: Not on file   Number of children: 4   Years of education: Not on file    Highest education level: Not on file  Occupational History   Not on file  Tobacco Use   Smoking status: Never    Passive exposure: Never   Smokeless tobacco: Never  Vaping Use   Vaping status: Never Used  Substance and Sexual Activity   Alcohol use: No    Comment: rare, 1 drink twice a year   Drug use: No   Sexual activity: Yes    Partners: Female  Other Topics Concern   Not on file  Social History Narrative   Not on file   Social Determinants of Health   Financial Resource Strain: Not on file  Food Insecurity: Not on file  Transportation Needs: Not on file  Physical Activity: Insufficiently Active (08/05/2018)   Exercise Vital Sign    Days of Exercise per Week: 1 day    Minutes of Exercise per Session: 120 min  Stress: No Stress Concern Present (08/05/2018)   [employer] of Occupational Health - Occupational Stress Questionnaire    Feeling of Stress : Only a little  Social Connections: Unknown (08/09/2021)   Received from [facility], [facility]   Social Network    Social Network: Not on file    Review of Systems: A 12 point ROS discussed and pertinent positives are indicated in the HPI above.  All other systems are negative.  Review of Systems  Constitutional:  Negative for activity change, fatigue and fever.  Respiratory:  Negative for cough and shortness of breath.   Cardiovascular:  Negative for chest pain.  Gastrointestinal:  Negative for abdominal pain and nausea.  Musculoskeletal:  Negative for gait problem.  Neurological:  Negative for weakness.  Psychiatric/Behavioral:  Negative for behavioral problems and confusion.     Vital Signs: BP 135/63   Pulse (!) 59   Temp 98.8 F (37.1 C) (Temporal)   Resp (!) 62   Ht 6\' 2"  (1.88 m)   Wt 215 lb (97.5 kg)   SpO2 98%   BMI 27.60 kg/m   Advance Care Plan: The advanced care plan/surrogate decision maker was discussed at the time of visit and documented in the medical record.    Physical  Exam Vitals reviewed.  HENT:     Mouth/Throat:     Mouth: Mucous membranes are moist.  Cardiovascular:     Rate and Rhythm: Normal rate and regular rhythm.     Heart sounds: Normal heart sounds. No murmur heard. Pulmonary:     Effort: Pulmonary effort is normal.     Breath sounds: Normal breath sounds. No wheezing.  Abdominal:     Palpations: Abdomen is soft.     Tenderness: There is no abdominal tenderness.  Musculoskeletal:        General: Normal range of motion.  Skin:    General: Skin is warm.  Neurological:     Mental Status: He is alert and oriented to person, place, and time.  Psychiatric:        Behavior: Behavior normal.     Imaging: CT ANGIO ABDOMEN  PELVIS  W & WO CONTRAST  Result Date: 11/20/2022 CLINICAL DATA:  82 year old male with a history of abdominal aortic aneurysm, status post EVAR. EXAM: CTA ABDOMEN AND PELVIS WITHOUT AND WITH CONTRAST TECHNIQUE: Multidetector CT imaging of the abdomen and pelvis was performed using the standard protocol during bolus administration of intravenous contrast. Multiplanar reconstructed images and MIPs were obtained and reviewed to evaluate the vascular anatomy. RADIATION DOSE REDUCTION: This exam was performed according to the departmental dose-optimization program which includes automated exposure control, adjustment of the mA and/or kV according to patient size and/or use of iterative reconstruction technique. CONTRAST:  ISOVUE-370 IOPAMIDOL (ISOVUE-370) INJECTION 76% COMPARISON:  01/24/2021, 02/18/2021, 09/16/2021 FINDINGS: VASCULAR Aorta: Minimal atherosclerosis of the distal thoracic aorta. Diameter at the hiatus measures 2.6 cm. Mild atherosclerotic changes of the abdominal aorta. Changes of endovascular aneurysm repair, infrarenal fixation, main body from the left. Greatest axial diameter of the excluded aneurysm sac is estimated 7.7 cm. The estimated diameter at baseline was reported 7.0 cm. The study dated 09/16/2021  indicates a diameter of 7.4 cm. No inflammatory changes or periaortic fluid. Accumulation of contrast within the excluded aneurysm sac is immediately adjacent to the IMA origin. The IMA remains patent, as the most likely source of type 2 endoleak. Celiac: Patent, with no significant atherosclerotic changes. SMA: Patent, with no significant atherosclerotic changes. Renals: - Right: Right renal artery patent. - Left: Left renal artery patent. IMA: IMA origin is excluded by the endograft. The IMA is patent to the origin with contrast opacifying the excluded aneurysm sac near the origin. Collateral flow via trans mesenteric collaterals, with filling of the left colic artery and the superior rectal arteries. Right lower extremity: Right iliac limb is patent, terminating in the common iliac artery. No evidence of type 1 B endoleak. No significant compression. No endo luminal thrombus/plaque. Hypogastric artery is patent as well as the pelvic arteries. External iliac artery patent without significant atherosclerosis. Common femoral artery patent. Proximal profunda femoris and SFA patent. Left lower extremity: Left iliac limb is patent, terminating in the common iliac artery. No type 1 B endoleak. No significant endo luminal thrombus or plaque. No compression or narrowing. Hypogastric artery patent.  Pelvic arteries patent. External iliac artery patent. Common femoral artery patent without significant atherosclerosis. Proximal profunda femoris and SFA. Veins: Unremarkable appearance of the venous system. Review of the MIP images confirms the above findings. NON-VASCULAR Lower chest: No acute. Hepatobiliary: Unremarkable appearance of the liver. Unremarkable gall bladder. Pancreas: Unremarkable. Spleen: Unremarkable. Adrenals/Urinary Tract: - Right adrenal gland: Unremarkable - Left adrenal gland: Unremarkable. - Right kidney: No hydronephrosis, nephrolithiasis, inflammation, or ureteral dilation. There are several fluid  density cysts of the right kidney. All of these are simple fluid, and none demonstrate any enhancement or internal complexity to categorize them beyond Bosniak 1 category. No further require up necessary. - Left Kidney: No hydronephrosis, nephrolithiasis, inflammation, or ureteral dilation. There are several left-sided fluid density cysts. All of these demonstrate simple fluid on Hounsfield units, and none of them demonstrate enhancement or internal complexity to categories them beyond Bosniak 1. No further follow-up is necessary. - Urinary Bladder: Unremarkable. Stomach/Bowel: - Stomach: Hiatal hernia.  Otherwise unremarkable stomach. - Small bowel: Unremarkable - Appendix: Normal. - Colon: Left-sided colonic diverticula. No inflammatory changes. No evidence of bowel obstruction. No significant stool burden. Lymphatic: No adenopathy. Mesenteric: No free fluid or air. No mesenteric adenopathy. Reproductive: Transverse diameter of the prostate estimated 6.8 cm. Other: Fat containing left inguinal hernia. Musculoskeletal:  No evidence of acute fracture. No bony canal narrowing. No significant degenerative changes of the hips. Vacuum disc phenomenon at L5-S1. 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 Aortic Atherosclerosis (ICD10-I70.0). Additional ancillary findings as above. Signed, Yvone Neu. Miachel Roux, RPVI [facility] Radiology Electronically Signed   By: Gilmer Mor D.O.   On: 11/20/2022 16:03   IR Radiologist Eval & Mgmt  Result Date: 11/17/2022 EXAM: NEW PATIENT OFFICE VISIT CHIEF COMPLAINT: See below HISTORY OF PRESENT ILLNESS: See below REVIEW OF SYSTEMS: See below PHYSICAL EXAMINATION: See below ASSESSMENT AND PLAN: Please refer to completed note in the electronic medical record on [place] [facility] Roanna Banning, MD [facility]  [facility]iology Special[facility] Radiology Electronically Signed   By: Roanna Banning M.D.   On: 11/17/2022 09:33    Labs:  CBC: Recent Labs    02/03/22 0000 06/30/22 1010 11/01/22 1104  WBC 4.5 4.2 4.3  HGB 13.2 13.7 13.3  HCT 39.3 41.2 39.7  PLT 232 253 242    COAGS: No results for input(s): "INR", "APTT" in the last 8760 hours.  BMP: Recent Labs    02/03/22 0000 06/30/22 1010 11/01/22 1104  NA 140 140 140  K 4.1 4.4 4.1  CL 102 102 102  CO2 30 30 30   GLUCOSE 141* 131* 117*  BUN 19 15 14   CALCIUM 10.2 10.2 9.7  CREATININE 1.45* 1.29* 1.19    LIVER FUNCTION TESTS: Recent Labs    02/03/22 0000 06/30/22 1010 11/01/22 1104  BILITOT 0.4 0.5 0.4  AST 13 13 16   ALT 13 17 19   PROT 7.1 7.4 7.0    TUMOR MARKERS: No results for input(s): "AFPTM", "CEA", "CA199", "CHROMGRNA" in the last 8760 hours.  Assessment and Plan:  Scheduled today for Percutaneous Endoleak Repair in IR Risks and benefits of percutaneous endoleak repair were discussed with the patient including, but not limited to bleeding, infection, vascular injury or contrast induced renal failure.  This interventional procedure involves the use of X-rays and because of the nature of the planned procedure, it is possible that we will have prolonged use of X-ray fluoroscopy.  Potential radiation risks to you include (but are not limited to) the following: - A slightly elevated risk for cancer  several years later in life. This risk is typically less than 0.5% percent. This risk is low in comparison to the normal incidence of human cancer, which is 33% for women and 50% for men according to the American Cancer Society. - Radiation induced injury can include skin redness, resembling a rash, tissue breakdown / ulcers and hair loss (which can be temporary or permanent).   The likelihood of either of these occurring depends on the difficulty of the procedure and whether you are sensitive to  radiation due to previous procedures, disease, or genetic conditions.   IF your procedure requires a prolonged use of radiation, you will be notified and given written instructions for further action.  It is your responsibility to monitor the irradiated area for the 2 weeks following the procedure and to notify your physician if you are concerned that you have suffered a radiation induced injury.    All of the patient's questions were answered, patient is agreeable to proceed.  Consent signed and in chart.   Thank you for this interesting consult.  I greatly enjoyed meeting Bryan Morgan  and look forward to participating in their care.  A copy of this report was sent to the requesting provider on this date.  Electronically Signed: Robet Leu, PA-C 11/30/2022, 10:32 AM   I spent a total of    25 Minutes in face to face in clinical consultation, greater than 50% of which was counseling/coordinating care for percutaneous endoleak repair

## 2022-11-30 NOTE — Progress Notes (Signed)
Patient s/p percutaneous endoleak repair today with Dr. Milford Cage. Patient assessed in Short Stay prior to discharge. He was sitting up in bed eating applesauce and stated he did not have any pain or discomfort. His abdomen was assessed with moderate palpation which did not elicit any pain or discomfort. Dr. Milford Cage also visited with the patient prior to discharge. Patient ok for discharge home and Dr. Milford Cage will follow up with the patient tomorrow.  Alwyn Ren, [place] [phone] 11/30/2022, 3:18 PM

## 2022-11-30 NOTE — Discharge Instructions (Signed)

## 2022-12-01 ENCOUNTER — Telehealth (HOSPITAL_COMMUNITY): Payer: Self-pay | Admitting: Interventional Radiology

## 2022-12-01 ENCOUNTER — Other Ambulatory Visit (HOSPITAL_COMMUNITY): Payer: Self-pay | Admitting: Interventional Radiology

## 2022-12-01 DIAGNOSIS — I714 Abdominal aortic aneurysm, without rupture, unspecified: Secondary | ICD-10-CM

## 2022-12-01 NOTE — Progress Notes (Signed)
Vascular and Interventional Radiology  Phone Note  Patient: Bryan Morgan DOB: May 05, 1940 Medical Record Number: [id number] Note Date/Time: 12/01/22 6:04 PM   Diagnosis: Hx AAA s/p EVAR, Type 2 endoleak  I identified myself to the patient and conveyed my credentials to Mr.Bryan Morgan For medical emergencies, Pt was advised to call 911 or go to the nearest emergency room.   Assessment  Plan: 82 y.o. year old male POD 1 s/p percutaneous endoleak repair. VIR reached out in courtesy follow-up.  Pt reports that they are doing well. Denies abdominal pain.  Had an incidence of constipation. Pt taking OTC stool softener as recommended.  No concern at this time.    Follow up Pt to follow up with me in Clinic within 6 month post op with CTA Abdomen Pelvis   As part of this Telephone encounter, no in-person exam was conducted.  The patient was physically located in [place] or a state in which I am permitted to provide care. The encounter was reasonable and appropriate under the circumstances given the patient's presentation at the time.   Roanna Banning, MD [facility] Inter[facility] Radiology [facility] Radiology   Pager. [phone] Clinic. [phone]

## 2023-01-05 ENCOUNTER — Ambulatory Visit: Payer: PPO | Admitting: Internal Medicine

## 2023-02-18 ENCOUNTER — Encounter: Payer: Self-pay | Admitting: Internal Medicine

## 2023-02-18 NOTE — Patient Instructions (Signed)

## 2023-02-18 NOTE — Progress Notes (Signed)
[place]   ADULT   &   ADOLESCENT   INTERNAL   MEDICINE  [name], M.D.        Rance Muir, D.NP      [name], D.NP  [facility] [address]  [place], [place]. [address] Telephone [phone] Telefax [phone]  Future Appointments  Date Time Provider Department  02/19/2023                     6 mo ov 10:30 AM [name], MD [facility]  05/01/2023 10:00 AM [name], MD [facility]  07/05/2023                       cpe 11:00 AM [name], MD [facility]  11/01/2023     11:30 AM [name], NP [facility]    History of Present Illness:       This very nice 82 y.o. MBM  w Marikay Alar & legal blindness since birth presents for 6 month follow up with HTN, HLD, T2_NIDDM and Vitamin D Deficiency.  Patient has  GERD  controlled with diet .        Patient is followed expectantly for labile  HTN  since 2020   & BP has been controlled at home. Today's BP is at goal -  124/68.  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 Patient has had no complaints of any cardiac type chest pain, palpitations, dyspnea Pollyann Kennedy /PND, dizziness, claudication  or dependent edema.        Hyperlipidemia is  not controlled with diet . 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 and has had no symptoms of reactive hypoglycemia, diabetic polys, paresthesias or visual blurring.  Last A1c was not at goal :  Lab Results  Component Value Date   HGBA1C 6.9 (H) 11/01/2022                                                          Further, the patient also has history of Vitamin D Deficiency ("13"/2020) and supplements vitamin D . Last  vitamin D was at goal :  Lab Results  Component Value Date   VD25OH 84 06/30/2022     Current Outpatient Medications on File Prior to Visit  Medication Sig   VITAMIN D 5000 u Take  daily.   Magnesium 500 MG T Take  daily.   metFORMIN-XR) 500 MG  Take 2 tablets 2 x /day with Meals (Patient not taking: Reported on 11/01/2022)   tamsulosin 0.4 MG CAPS  Take 0.4 mg by mouth at bedtime.    No Known Allergies   PMHx:   Past Medical History:  Diagnosis Date   AAA (abdominal aortic aneurysm) (HCC)    BPH (benign prostatic hyperplasia)    with retention, s/p foley catheter placement in [facility] ED 12/29/20   History of kidney stones  History of needle biopsy of prostate with negative result    Legal blindness    Positive colorectal cancer screening using Cologuard test 08/20/2018   08/18/2018 - GI referral placed; tubular & tubulovillous adenomas 09/12/18 colonoscopy, 3 year f/u recommended - Overdue    Pre-diabetes 06/04/2020     Immunization History  Administered Date(s) Administered   Comptroller (J&J) SARS-COV-2 Vaccination 08/16/2019, 04/02/2020     Past Surgical History:  Procedure Laterality Date   ABDOMINAL AORTIC ENDOVASCULAR STENT GRAFT N/A 01/27/2021   Procedure: ABDOMINAL AORTIC ENDOVASCULAR STENT GRAFT REPAIR;  Surgeon: [name], MD;  Location: [facility] OR;  Service: Vascular;  Laterality: N/A;   CATARACT EXTRACTION, BILATERAL  2007   CYSTOSCOPY N/A 03/29/2021   Procedure: CYSTOSCOPY;  Surgeon: Belva Agee, MD;  Location: [facility] ORS;  Service: Urology;  Laterality: N/A;   IR RADIOLOGIST EVAL & MGMT  11/17/2022   TRANSURETHRAL RESECTION OF PROSTATE N/A 03/29/2021   Procedure: TRANSURETHRAL RESECTION OF THE PROSTATE (TURP);  Surgeon: Belva Agee, MD;  Location: [facility] ORS;  Service: Urology;  Laterality: N/A;   ULTRASOUND GUIDANCE FOR VASCULAR ACCESS Bilateral 01/27/2021   Procedure: ULTRASOUND GUIDANCE FOR VASCULAR ACCESS, BILATERAL FEMORAL ARTERIES;  Surgeon: [name], MD;  Location: [facility] OR;  Service: Vascular;  Laterality: Bilateral;     FHx:    Reviewed / unchanged   SHx:    Reviewed / unchanged    Systems Review:  Constitutional: Denies fever, chills, wt changes, headaches, insomnia, fatigue, night sweats, change in appetite. Eyes: Denies redness, blurred vision, diplopia, discharge, itchy, watery eyes.  ENT: Denies discharge, congestion, post nasal drip, epistaxis, sore throat, earache, hearing loss, dental pain, tinnitus, vertigo, sinus pain, snoring.  CV: Denies chest pain, palpitations, irregular heartbeat, syncope, dyspnea, diaphoresis, orthopnea, PND, claudication or edema. Respiratory: denies cough, dyspnea, DOE, pleurisy, hoarseness, laryngitis, wheezing.  Gastrointestinal: Denies dysphagia, odynophagia, heartburn, reflux, water brash, abdominal pain or cramps, nausea, vomiting, bloating, diarrhea, constipation, hematemesis, melena, hematochezia  or hemorrhoids. Genitourinary: Denies dysuria, frequency, urgency, nocturia, hesitancy, discharge, hematuria or flank pain. Musculoskeletal: Denies arthralgias, myalgias, stiffness, jt. swelling, pain, limping or strain/sprain.  Skin: Denies pruritus, rash, hives, warts, acne, eczema or change in skin lesion(s). Neuro: No weakness, tremor, incoordination, spasms, paresthesia or pain. Psychiatric: Denies confusion, memory loss or sensory loss. Endo: Denies change in weight, skin or hair change.  Heme/Lymph: No excessive bleeding, bruising or enlarged lymph nodes.   Physical Exam  BP 124/68   Pulse 88   Temp 97.9 F (36.6 C)   Resp 16   Ht 6' 1.5" (1.867 m)   Wt 223 lb (101.2 kg)   SpO2 97%   BMI 29.02 kg/m   Appears  well nourished, well groomed  and in no distress.  Eyes: Sl anisocoria w/o tonic light reflex and Rt pupil  elliptical. 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 Vision decreased to forms w/o  details. Sinuses: No frontal/maxillary tenderness ENT/Mouth: EAC's clear, TM's nl w/o erythema, bulging. Nares clear w/o erythema, swelling, exudates. Oropharynx clear without erythema or exudates. Oral hygiene is good. Tongue normal, non obstructing. Hearing intact.  Neck: Supple. Thyroid not palpable. Car 2+/2+ without bruits, nodes or JVD. Chest: Respirations nl with BS clear & equal w/o rales, rhonchi, wheezing or stridor.  Cor: Heart sounds normal w/ regular rate and rhythm without sig. murmurs, gallops, clicks or rubs. Peripheral pulses normal and equal  without edema.  Abdomen: Soft &  bowel sounds normal. Non-tender w/o guarding, rebound, hernias, masses or organomegaly.  Lymphatics: Unremarkable.  Musculoskeletal: Full ROM all peripheral extremities, joint stability, 5/5 strength and normal gait.  Skin: Warm, dry without exposed rashes, lesions or ecchymosis apparent.  Neuro: Cranial nerves intact, reflexes equal bilaterally. Sensory-motor testing grossly intact. Tendon reflexes grossly intact.  Pysch: Alert & oriented x 3.  Insight and judgement nl & appropriate. No ideations.   Assessment and Plan:   1. Labile hypertension  - Continue medication, monitor blood pressure at home.  - Continue DASH diet.  Reminder to go to the ER if any CP,  SOB, nausea, dizziness, severe HA, changes vision/speech.   - CBC with Differential/Platelet - COMPLETE METABOLIC PANEL WITH GFR - Magnesium - TSH   2. Hyperlipidemia due to type 2 diabetes mellitus (HCC)  - Continue diet/meds, exercise,& lifestyle modifications.  - Continue monitor periodic cholesterol/liver & renal functions    - Lipid panel - TSH   3. Type 2 diabetes mellitus with stage 2 chronic kidney                            disease, without long-term current use of insulin (HCC)  - Continue diet, exercise  - Lifestyle modifications.  - Monitor appropriate labs    - Hemoglobin A1c - Insulin, random   4. Vitamin D  deficiency  - Continue supplementation   - VITAMIN D 25 Hydroxy    5. History of adenomatous polyp of colon  - Ambulatory referral to Gastroenterology   6. Medication management  - CBC with Differential/Platelet - COMPLETE METABOLIC PANEL WITH GFR - Magnesium - Lipid panel - TSH - Hemoglobin A1c - Insulin, random - VITAMIN D 25 Hydroxy          Discussed  regular exercise, BP monitoring, weight control to achieve/maintain BMI less than 25 and discussed med and SE's. Recommended labs to assess /monitor clinical status .  I discussed the assessment and treatment plan with the patient. The patient was provided an opportunity to ask questions and all were answered. The patient agreed with the plan and demonstrated an understanding of the instructions.  I provided over 30 minutes of exam, counseling, chart review and  complex critical decision making.        The patient was advised to call back or seek an in-person evaluation if the symptoms worsen or if the condition fails to improve as anticipated.   Marinus Maw, MD

## 2023-02-19 ENCOUNTER — Ambulatory Visit (INDEPENDENT_AMBULATORY_CARE_PROVIDER_SITE_OTHER): Payer: PPO | Admitting: Internal Medicine

## 2023-02-19 ENCOUNTER — Encounter: Payer: Self-pay | Admitting: Internal Medicine

## 2023-02-19 VITALS — BP 124/68 | HR 88 | Temp 97.9°F | Resp 16 | Ht 73.5 in | Wt 223.0 lb

## 2023-02-19 DIAGNOSIS — Z860101 Personal history of adenomatous and serrated colon polyps: Secondary | ICD-10-CM

## 2023-02-19 DIAGNOSIS — E785 Hyperlipidemia, unspecified: Secondary | ICD-10-CM

## 2023-02-19 DIAGNOSIS — N182 Chronic kidney disease, stage 2 (mild): Secondary | ICD-10-CM

## 2023-02-19 DIAGNOSIS — E1122 Type 2 diabetes mellitus with diabetic chronic kidney disease: Secondary | ICD-10-CM

## 2023-02-19 DIAGNOSIS — R0989 Other specified symptoms and signs involving the circulatory and respiratory systems: Secondary | ICD-10-CM

## 2023-02-19 DIAGNOSIS — Z79899 Other long term (current) drug therapy: Secondary | ICD-10-CM

## 2023-02-19 DIAGNOSIS — E1169 Type 2 diabetes mellitus with other specified complication: Secondary | ICD-10-CM | POA: Diagnosis not present

## 2023-02-19 DIAGNOSIS — E559 Vitamin D deficiency, unspecified: Secondary | ICD-10-CM

## 2023-02-20 ENCOUNTER — Other Ambulatory Visit: Payer: Self-pay | Admitting: Internal Medicine

## 2023-02-20 DIAGNOSIS — E039 Hypothyroidism, unspecified: Secondary | ICD-10-CM

## 2023-02-20 LAB — CBC WITH DIFFERENTIAL/PLATELET
Absolute Lymphocytes: 1507 {cells}/uL (ref 850–3900)
Absolute Monocytes: 341 {cells}/uL (ref 200–950)
Basophils Absolute: 19 {cells}/uL (ref 0–200)
Basophils Relative: 0.4 %
Eosinophils Absolute: 120 {cells}/uL (ref 15–500)
Eosinophils Relative: 2.5 %
HCT: 40.7 % (ref 38.5–50.0)
Hemoglobin: 13.3 g/dL (ref 13.2–17.1)
MCH: 29.8 pg (ref 27.0–33.0)
MCHC: 32.7 g/dL (ref 32.0–36.0)
MCV: 91.1 fL (ref 80.0–100.0)
MPV: 10.6 fL (ref 7.5–12.5)
Monocytes Relative: 7.1 %
Neutro Abs: 2813 {cells}/uL (ref 1500–7800)
Neutrophils Relative %: 58.6 %
Platelets: 271 10*3/uL (ref 140–400)
RBC: 4.47 10*6/uL (ref 4.20–5.80)
RDW: 13.9 % (ref 11.0–15.0)
Total Lymphocyte: 31.4 %
WBC: 4.8 10*3/uL (ref 3.8–10.8)

## 2023-02-20 LAB — COMPLETE METABOLIC PANEL WITH GFR
AG Ratio: 1.3 (calc) (ref 1.0–2.5)
ALT: 15 U/L (ref 9–46)
AST: 17 U/L (ref 10–35)
Albumin: 4.2 g/dL (ref 3.6–5.1)
Alkaline phosphatase (APISO): 56 U/L (ref 35–144)
BUN/Creatinine Ratio: 12 (calc) (ref 6–22)
BUN: 17 mg/dL (ref 7–25)
CO2: 31 mmol/L (ref 20–32)
Calcium: 10.4 mg/dL — ABNORMAL HIGH (ref 8.6–10.3)
Chloride: 103 mmol/L (ref 98–110)
Creat: 1.4 mg/dL — ABNORMAL HIGH (ref 0.70–1.22)
Globulin: 3.2 g/dL (ref 1.9–3.7)
Glucose, Bld: 85 mg/dL (ref 65–99)
Potassium: 4.4 mmol/L (ref 3.5–5.3)
Sodium: 141 mmol/L (ref 135–146)
Total Bilirubin: 0.5 mg/dL (ref 0.2–1.2)
Total Protein: 7.4 g/dL (ref 6.1–8.1)
eGFR: 50 mL/min/{1.73_m2} — ABNORMAL LOW (ref >=60)

## 2023-02-20 LAB — TSH: TSH: 30.05 m[IU]/L — ABNORMAL HIGH (ref 0.40–4.50)

## 2023-02-20 LAB — LIPID PANEL
Cholesterol: 211 mg/dL — ABNORMAL HIGH (ref <200)
HDL: 36 mg/dL — ABNORMAL LOW (ref >=40)
LDL Cholesterol (Calc): 124 mg/dL — ABNORMAL HIGH
Non-HDL Cholesterol (Calc): 175 mg/dL — ABNORMAL HIGH (ref <130)
Total CHOL/HDL Ratio: 5.9 (calc) — ABNORMAL HIGH (ref <5.0)
Triglycerides: 357 mg/dL — ABNORMAL HIGH (ref <150)

## 2023-02-20 LAB — VITAMIN D 25 HYDROXY (VIT D DEFICIENCY, FRACTURES): Vit D, 25-Hydroxy: 86 ng/mL (ref 30–100)

## 2023-02-20 LAB — HEMOGLOBIN A1C
Hgb A1c MFr Bld: 6.9 %{Hb} — ABNORMAL HIGH (ref <5.7)
Mean Plasma Glucose: 151 mg/dL
eAG (mmol/L): 8.4 mmol/L

## 2023-02-20 LAB — MAGNESIUM: Magnesium: 2.4 mg/dL (ref 1.5–2.5)

## 2023-02-20 LAB — INSULIN, RANDOM: Insulin: 26.6 u[IU]/mL — ABNORMAL HIGH

## 2023-02-20 MED ORDER — LEVOTHYROXINE SODIUM 100 MCG PO TABS: ORAL_TABLET | ORAL | 1 refills | Status: AC

## 2023-02-20 NOTE — Progress Notes (Signed)
[] [] [] [] [] [] [] [] [] [] [] [] [] [] [] [] [] [] [] [] [] [] [] [] [] [] [] [] [] [] [] [] [] [] [] [] [] [] [] [] [] ][] [] [] [] [] [] [] [] [] [] [] [] [] [] [] [] [] [] [] [] [] [] [[] [] [] [] []  [] [] [] [] [] [] [] [] [] [] [] [] [] [] [] [] [] [] [] [] [] [] [] [] [] [] [] [] [] [] [] [] [] [] [] [] [] [] [] [] [] ][] [] [] [] [] [] [] [] [] [] [] [] [] [] [] [] [] [] [] [] [] [] [[] [] [] [] []  -Test results slightly outside the reference range are not unusual. If there is anything important, I will review this with you,  otherwise it is considered normal test values.  If you have further questions,  please do not hesitate to contact me at the office or via My Chart.  [] [] [] [] [] [] [] [] [] [] [] [] [] [] [] [] [] [] [] [] [] [] [] [] [] [] [] [] [] [] [] [] [] [] [] [] [] [] [] [] [] ][] [] [] [] [] [] [] [] [] [] [] [] [] [] [] [] [] [] [] [] [] [] [[] [] [] [] []  [] [] [] [] [] [] [] [] [] [] [] [] [] [] [] [] [] [] [] [] [] [] [] [] [] [] [] [] [] [] [] [] [] [] [] [] [] [] [] [] [] ][] [] [] [] [] [] [] [] [] [] [] [] [] [] [] [] [] [] [] [] [] [] [[] [] [] [] []   -   Kidney Functions Sill Stage 3a & Stable  - Great  !  [] [] [] [] [] [] [] [] [] [] [] [] [] [] [] [] [] [] [] [] [] [] [] [] [] [] [] [] [] [] [] [] [] [] [] [] [] [] [] [] [] ][] [] [] [] [] [] [] [] [] [] [] [] [] [] [] [] [] [] [] [] [] [] [[] [] [] [] []   -   Chol has gone up Dramatically & Dangerously from 171 to 211  &      Bad /Dangerous LDL Chol has gone up from 409 to 125    - Cholesterol is way  too high  - Need a much stricter diet or will need meds to avoid having                                                   Heart Attack , Stroke, Kidney Failure or Dementia !   - Cholesterol only comes from animal sources                                                                           - ie. meat, dairy, egg yolks  - Eat all the vegetables you want.  - Avoid Meat, Avoid Meat,  Avoid Meat                                                       - especially Red Meat - Beef AND Pork .  - Avoid cheese & dairy - milk & ice cream.     - Cheese is the most concentrated form of trans-fats which                                                          is the worst thing to clog up our arteries.    - Veggie cheese is OK  which can be found in the fresh                         produce section at [employer] or [employer] or [employer]  [] [] [] [] [] [] [] [] [] [] [] [] [] [] [] [] [] [] [] [] [] [] [] [] [] [] [] [] [] [] [] [] [] [] [] [] [] [] [] [] [] ][] [] [] [] [] [] [] [] [] [] [] [] [] [] [] [] [] [] [] [] [] [] [[] [] [] [] []   - Also Triglycerides ( = 357     ) or fats in blood are too high                 (   Ideal or  Goal is less than 150  !  )    - Recommend avoid fried & greasy foods,  sweets / candy,   - Avoid white rice  (brown or wild rice or Quinoa is OK),   - Avoid white potatoes  (sweet potatoes are OK)   - Avoid anything made from white flour  -  bagels, doughnuts, rolls, buns, biscuits, white and   wheat breads, pizza crust and traditional                                                   pasta made of white flour & egg white  - (vegetarian pasta or spinach or wheat pasta is OK).    - Multi-grain bread is OK - like multi-grain flat bread or sandwich thins.   - Avoid alcohol in excess.   - Exercise is also important.  [] [] [] [] [] [] [] [] [] [] [] [] [] [] [] [] [] [] [] [] [] [] [] [] [] [] [] [] [] [] [] [] [] [] [] [] [] [] [] [] [] ][] [] [] [] [] [] [] [] [] [] [] [] [] [] [] [] [] [] [] [] [] [] [[] [] [] [] []   -  Your TSH is very high, which means your thyroid gland has  stopped making Thyroid hormone - which is very low &   this may be contributory  in part to your worsening Cholesterol    - So sent in a new med for Thyroid & must be taken correctly as follows   Take    on an empty stomach                  with only water for 30 minutes &   no Antacid meds, Calcium or Magnesium for 4 hours & avoid Biotin  - Please schedule an office visit in 1 month to recheck Thyroid   [] [] [] [] [] [] [] [] [] [] [] [] [] [] [] [] [] [] [] [] [] [] [] [] [] [] [] [] [] [] [] [] [] [] [] [] [] [] [] [] [] ][] [] [] [] [] [] [] [] [] [] [] [] [] [] [] [] [] [] [] [] [] [] [[] [] [] [] []   -   A1c is elevated again at 6.9% - way too high                                               - You must Start the Metformin Diabetic Pills   - Ignoring your Diabetes  which can cause                              Heart Attack,                                           Stroke ,                                                Kidney Failure ,                                                          Nerve Damage &                                                                        Alzheimer's Dementia  Will not make it go away by not accepting it or ignoring it   - So Please start the Metformin  [] [] [] [] [] [] [] [] [] [] [] [] [] [] [] [] [] [] [] [] [] [] [] [] [] [] [] [] [] [] [] [] [] [] [] [] [] [] [] [] [] ][] [] [] [] [] [] [] [] [] [] [] [] [] [] [] [] [] [] [] [] [] [] [[] [] [] [] []   -   Vitamin D = 86  is [place]  !                       Please keep dosage same   [] [] [] [] [] [] [] [] [] [] [] [] [] [] [] [] [] [] [] [] [] [] [] [] [] [] [] [] [] [] [] [] [] [] [] [] [] [] [] [] [] ][] [] [] [] [] [] [] [] [] [] [] [] [] [] [] [] [] [] [] [] [] [] [[] [] [] [] []   -   All Else - CBC - Electrolytes - Liver - Magnesium & Thyroid    - all  Normal / OK  [] [] [] [] [] [] [] [] [] [] [] [] [] [] [] [] [] [] [] [] [] [] [] [] [] [] [] [] [] [] [] [] [] [] [] [] [] [] [] [] [] ][] [] [] [] [] [] [] [] [] [] [] [] [] [] [] [] [] [] [] [] [] [] [[] [] [] [] []

## 2023-03-22 DIAGNOSIS — N401 Enlarged prostate with lower urinary tract symptoms: Secondary | ICD-10-CM | POA: Diagnosis not present

## 2023-03-22 DIAGNOSIS — R972 Elevated prostate specific antigen [PSA]: Secondary | ICD-10-CM | POA: Diagnosis not present

## 2023-03-22 DIAGNOSIS — R3914 Feeling of incomplete bladder emptying: Secondary | ICD-10-CM | POA: Diagnosis not present

## 2023-03-22 IMAGING — CT CT CTA ABD/PEL W/CM AND/OR W/O CM
2 of 7 series · 14 of 46 positions shown, 16 images · IV contrast (APPLIED)
Comparison: None.

CLINICAL DATA: Abdominal aortic aneurysm without rupture.

EXAM:
CTA ABDOMEN AND PELVIS WITHOUT AND WITH CONTRAST
TECHNIQUE: Multidetector CT imaging of the abdomen and pelvis was performed
using the standard protocol during bolus administration of
intravenous contrast. Multiplanar reconstructed images and MIPs were
obtained and reviewed to evaluate the vascular anatomy.
CONTRAST:  100mL OMNIPAQUE IOHEXOL 350 MG/ML SOLN

[Series 4: axial arterial · axial · arterial · 0.85mm/px · z∈[-529,-73]mm · 11 of 176 slices shown, 13 images]
[im 12/176  soft-tissue]
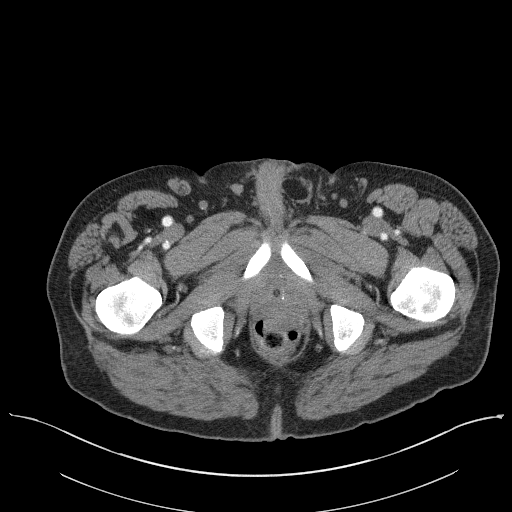
[im 12/176  bone]
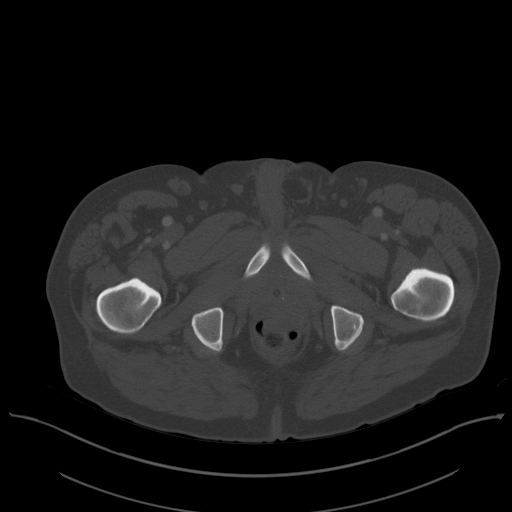
[im 24/176  soft-tissue]
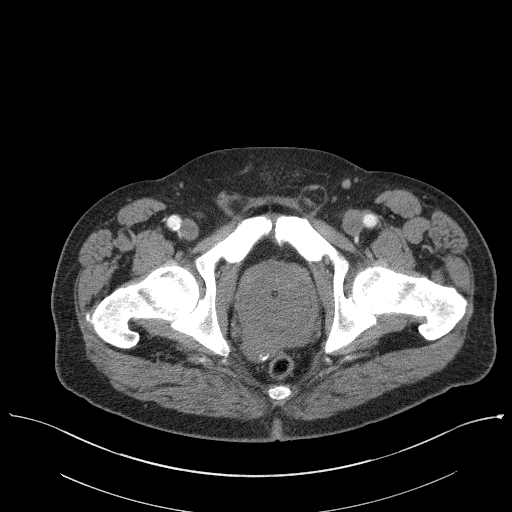
[im 47/176  soft-tissue]
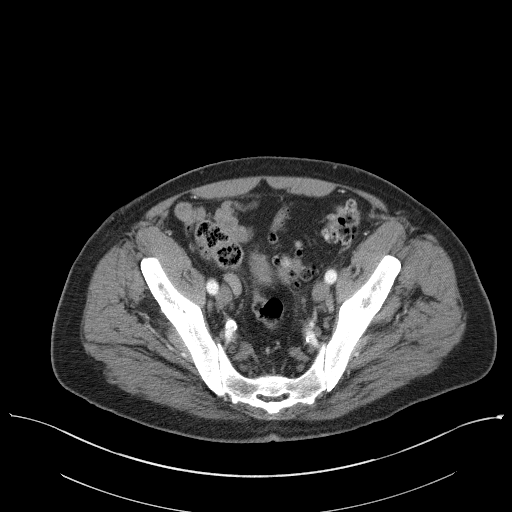
[im 59/176  soft-tissue]
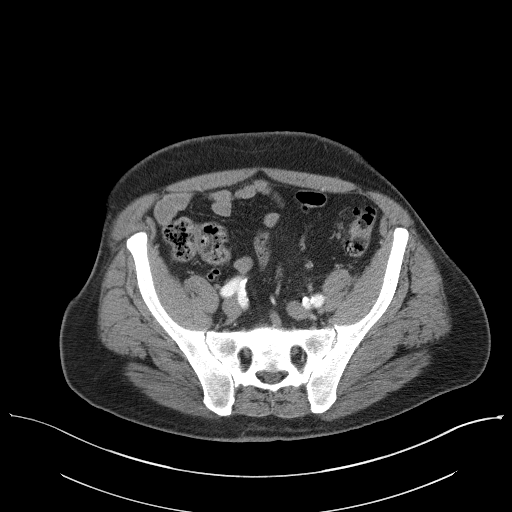
[im 71/176  soft-tissue]
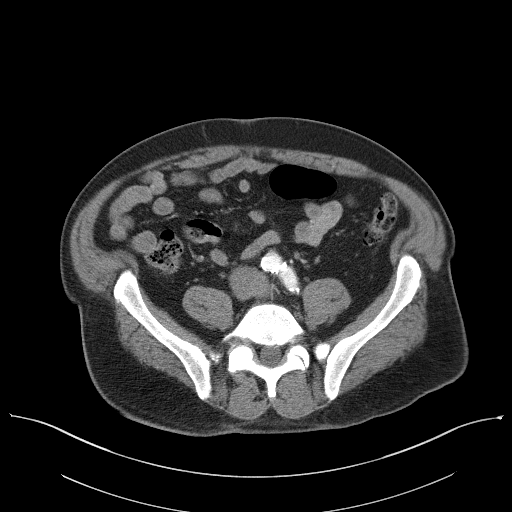
[im 94/176  soft-tissue]
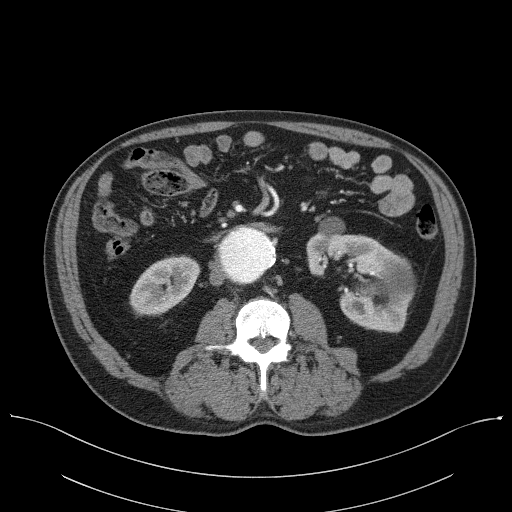
[im 106/176  soft-tissue]
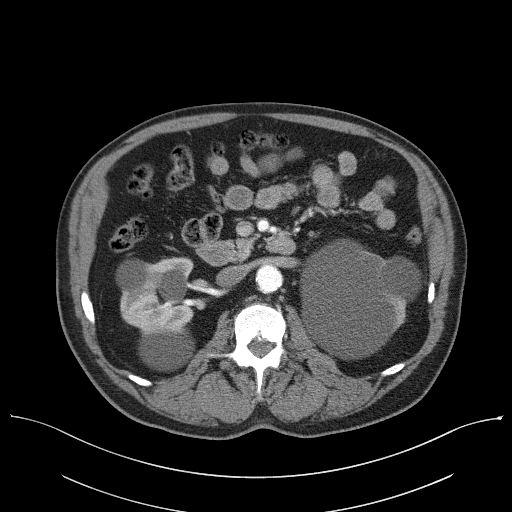
[im 117/176  soft-tissue]
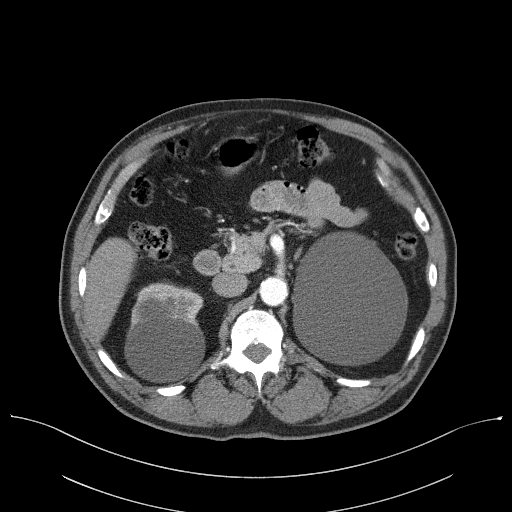
[im 129/176  soft-tissue]
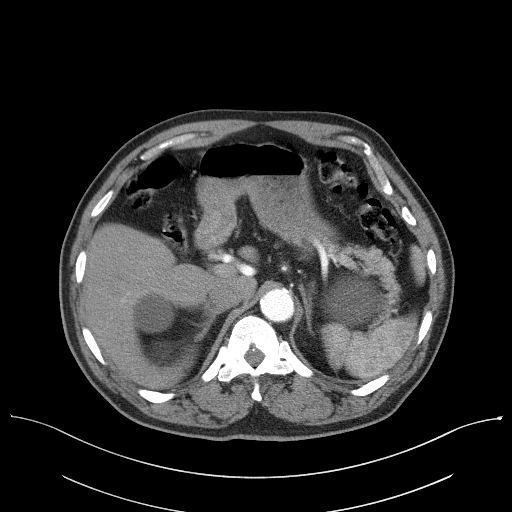
[im 129/176  bone]
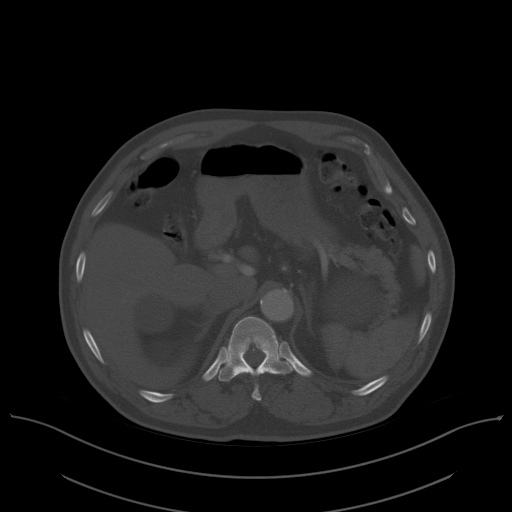
[im 152/176  soft-tissue]
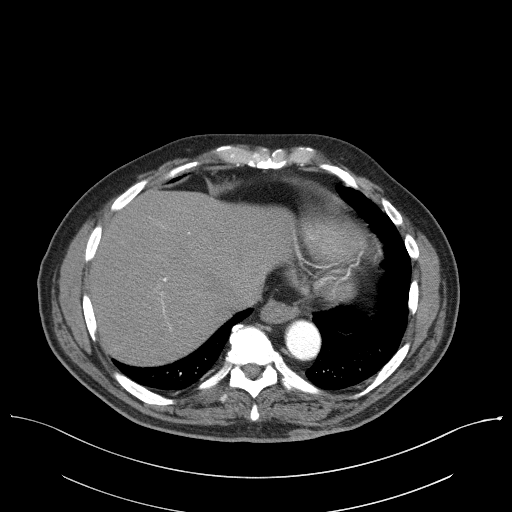
[im 164/176  soft-tissue]
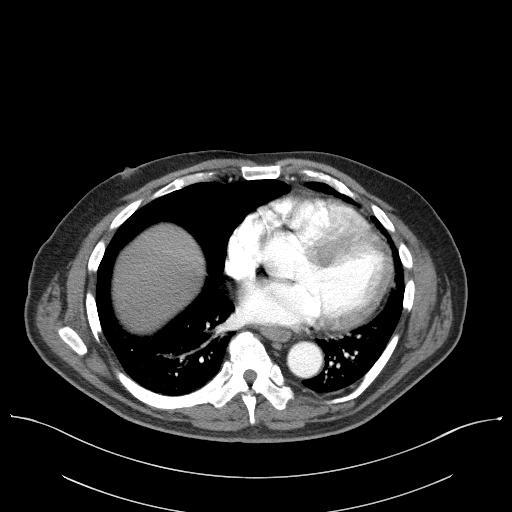

[Series 5: coronal · coronal · 0.87mm/px · 3 of 106 slices shown]
[im 27/106  soft-tissue]
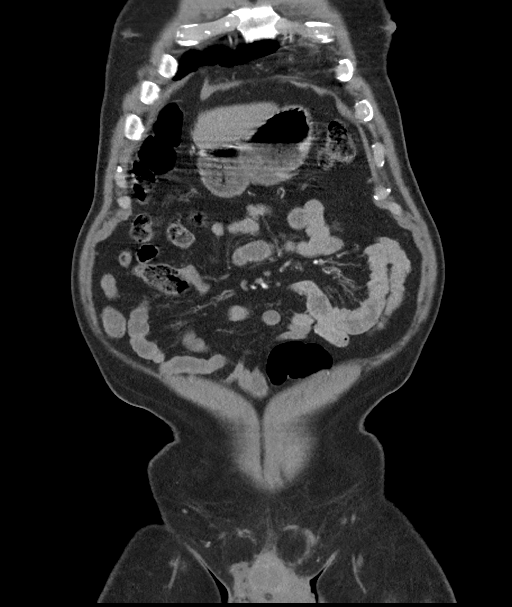
[im 53/106  soft-tissue]
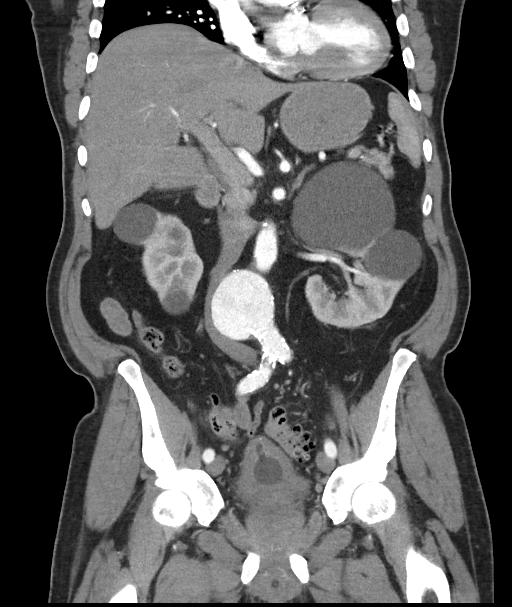
[im 79/106  soft-tissue]
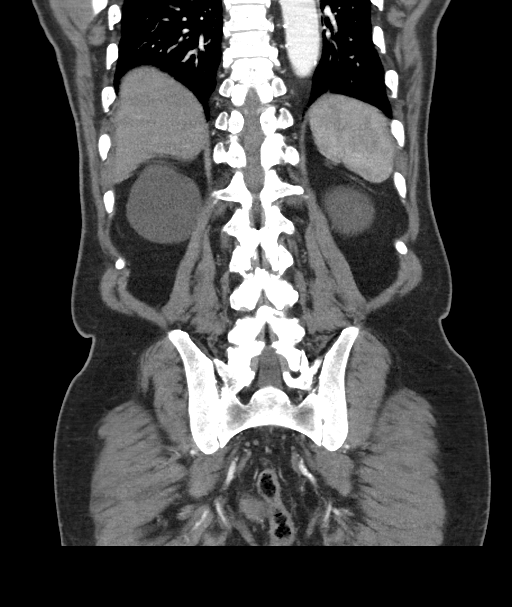

[14 of 46 positions shown; findings below may reference images not displayed]

FINDINGS: VASCULAR

Aorta: Atherosclerotic disease involving descending thoracic aorta
without aneurysm. Saccular aneurysm involving the infrarenal
abdominal aorta which protrudes along the right side of the
abdominal aorta. This aneurysm measures 7.0 x 5.8 cm on sequence 4
image 92. No definite dissection without difficult to exclude small
intimal flaps in the region of the aneurysm. Atherosclerotic
calcifications involving the aortic wall. No evidence for an aortic
rupture. The saccular aneurysm terminates approximately 2.8 cm above
the aortic bifurcation.

Celiac: Patent without evidence of aneurysm, dissection, vasculitis
or significant stenosis.

SMA: Patent without evidence of aneurysm, dissection, vasculitis or
significant stenosis.

Renals: Both renal arteries are patent without evidence of aneurysm,
dissection, vasculitis, fibromuscular dysplasia or significant
stenosis.

IMA: Patent without evidence of aneurysm, dissection, vasculitis or
significant stenosis.

Inflow: Patent without evidence of aneurysm, dissection, vasculitis
or significant stenosis.

Proximal Outflow: Proximal femoral arteries are patent bilaterally.

Veins: IVC is displaced towards the right due to the large saccular
aortic aneurysm. Renal veins are patent. Main portal venous system
appears to be patent.

Review of the MIP images confirms the above findings.

NON-VASCULAR

Lower chest: Coronary artery calcifications. Lung bases are clear
without pleural effusions.

Hepatobiliary: Normal appearance of the liver and gallbladder.

Pancreas: Unremarkable. No pancreatic ductal dilatation or
surrounding inflammatory changes.

Spleen: Normal in size without focal abnormality.

Adrenals/Urinary Tract: Adrenal glands are within normal limits.
Large bilateral renal cysts. Index cyst in the left kidney upper
pole measures up to 12.1 cm. No suspicious renal lesions. Negative
for hydronephrosis. Urinary bladder is decompressed with Foley
catheter. Bladder wall appears to be thickened despite being
decompressed and mild stranding around the bladder.

Stomach/Bowel: Prominent diverticula involving the sigmoid colon.
Normal appendix without inflammatory changes. No evidence for bowel
distention or inflammation. Stomach is within normal limits.

Lymphatic: There are at least 2 right-sided perirectal lymph nodes,
largest measures 7 mm in the short axis on sequence 4, image 146.
Mildly prominent lymph nodes along the left pelvic sidewall on
sequence 4 image 140 measuring 1.0 cm in the short axis. No
significant lymph node enlargement in the abdomen.

Reproductive: Prostate is markedly enlarged measuring at least
cm in the transverse dimension. Asymmetry of the seminal vesicles,
right side greater than left.

Other: Negative for free fluid in the abdomen or pelvis. Mild
stranding around the urinary bladder which could be chronic but
nonspecific. Tiny umbilical hernia containing fat. Left inguinal
hernia containing fat.

Musculoskeletal: Disc space narrowing at L5-S1. No suspicious bony
lesions.
IMPRESSION: VASCULAR

1. Large saccular aneurysm involving the infrarenal abdominal aorta.
The saccular aneurysm measures up to 7.0 cm. Recommend referral to a
vascular specialist. This recommendation follows ACR consensus
guidelines: White Paper of the ACR Incidental Findings Committee II
on Vascular Findings. [HOSPITAL] 2607; [DATE].
2. Coronary artery calcifications.
3.  Aortic Atherosclerosis ([id number]).

NON-VASCULAR

1. Prostate enlargement and asymmetric enlargement of the right
seminal vesicle. In addition, there is probably bladder wall
thickening with mild bladder wall stranding which could be chronic
and related to chronic bladder outlet obstruction. Foley catheter is
present.
2. **An incidental finding of potential clinical significance has
been found.
Mildly prominent pelvic lymph nodes with suspicious right perirectal
lymph nodes. These lymph nodes could be reactive but a neoplastic
process in the pelvis cannot be excluded. **
3. Multiple large bilateral renal cysts. Negative for
hydronephrosis.

## 2023-03-27 ENCOUNTER — Encounter: Payer: Self-pay | Admitting: Internal Medicine

## 2023-04-03 ENCOUNTER — Encounter (HOSPITAL_COMMUNITY): Payer: Self-pay | Admitting: Emergency Medicine

## 2023-04-03 ENCOUNTER — Other Ambulatory Visit: Payer: Self-pay

## 2023-04-03 ENCOUNTER — Emergency Department (HOSPITAL_COMMUNITY)
Admission: EM | Admit: 2023-04-03 | Discharge: 2023-04-03 | Disposition: A | Discharge status: Home / Self Care | Payer: PPO | Attending: Emergency Medicine | Admitting: Emergency Medicine

## 2023-04-03 DIAGNOSIS — R10819 Abdominal tenderness, unspecified site: Secondary | ICD-10-CM | POA: Insufficient documentation

## 2023-04-03 DIAGNOSIS — R339 Retention of urine, unspecified: Secondary | ICD-10-CM | POA: Diagnosis not present

## 2023-04-03 HISTORY — DX: Benign prostatic hyperplasia without lower urinary tract symptoms: N40.0

## 2023-04-03 LAB — URINALYSIS, ROUTINE W REFLEX MICROSCOPIC
Bacteria, UA: NONE SEEN
Bilirubin Urine: NEGATIVE
Glucose, UA: NEGATIVE mg/dL
Ketones, ur: NEGATIVE mg/dL
Leukocytes,Ua: NEGATIVE
Nitrite: NEGATIVE
Protein, ur: 100 mg/dL — AB
RBC / HPF: >50 RBC/hpf (ref 0–5)
Specific Gravity, Urine: 1.016 (ref 1.005–1.030)
pH: 8 (ref 5.0–8.0)

## 2023-04-03 NOTE — ED Notes (Signed)
 Attempted to insert foley and unable to advance same.

## 2023-04-03 NOTE — ED Provider Notes (Signed)
  EMERGENCY DEPARTMENT AT [facility] Provider Note   CSN: [id number] Arrival date & time: 04/03/23  [id number]     History  Chief Complaint  Patient presents with   Urinary Retention    Bryan Morgan is a 83 y.o. male.  HPI Presents with concern of no urination in the past day, but decreased urination over the past 3 to 4 days.  He notes that he recently started levothyroxine  with his urologist, has a history of prior prostate surgery as well as prior urinary retention.  He denies other complaints including chest pain, dyspnea, fever, chills.     Home Medications Prior to Admission medications   Medication Sig Start Date End Date Taking? Authorizing Provider  amoxicillin (AMOXIL) 875 MG tablet Take 875 mg by mouth 2 (two) times daily.   Yes [provider]  Cholecalciferol (VITAMIN D -3 PO) Take 1 capsule by mouth at bedtime.   Yes [provider]  finasteride  (PROSCAR ) 5 MG tablet Take 5 mg by mouth daily.   Yes [provider]  levothyroxine  (SYNTHROID ) 100 MCG tablet Take  1 tablet  Daily  on an empty stomach with only water  for 30 minutes & no Antacid meds, Calcium  or Magnesium  for 4 hours & avoid Biotin Patient taking differently: Take 100 mcg by mouth at bedtime. 02/20/23  Yes Tonita Fallow, MD  magnesium  citrate SOLN Take 30 mLs by mouth at bedtime.   Yes [provider]  tamsulosin (FLOMAX) 0.4 MG CAPS capsule Take 0.4 mg by mouth at bedtime. 04/07/22  Yes [provider]      Allergies    Patient has no known allergies.    Review of Systems   Review of Systems  Physical Exam Updated Vital Signs BP (!) 155/89   Pulse 75   Temp 98.3 F (36.8 C)   Resp 16   Ht 6' 2 (1.88 m)   Wt 96.2 kg   SpO2 99%   BMI 27.22 kg/m  Physical Exam Vitals and nursing note reviewed.  Constitutional:      General: He is not in acute distress.    Appearance: He is well-developed.  HENT:     Head: Normocephalic  and atraumatic.  Eyes:     Conjunctiva/sclera: Conjunctivae normal.  Pulmonary:     Effort: Pulmonary effort is normal. No respiratory distress.     Breath sounds: No stridor.  Abdominal:     General: There is distension.     Tenderness: There is abdominal tenderness.  Skin:    General: Skin is warm and dry.  Neurological:     Mental Status: He is alert and oriented to person, place, and time.     ED Results / Procedures / Treatments   Labs (all labs ordered are listed, but only abnormal results are displayed) Labs Reviewed  URINALYSIS, ROUTINE W REFLEX MICROSCOPIC - Abnormal; Notable for the following components:      Result Value   APPearance CLOUDY (*)    Hgb urine dipstick MODERATE (*)    Protein, ur 100 (*)    All other components within normal limits    EKG None  Radiology No results found.  Procedures Procedures    Medications Ordered in ED Medications - No data to display  ED Course/ Medical Decision Making/ A&P  Medical Decision Making Patient with acute urinary retention, clinically on arrival, bedside ultrasound performed stat, by ED tech, observed by myself with greater than 800 mL of urine.  Patient had Foley catheter placed, with improved condition.  Patient is not hypotensive, febrile or particularly tachycardic, low suspicion for infection, bacteremia, sepsis, though these are considerations in addition to his retention.  Amount and/or Complexity of Data Reviewed External Data Reviewed: notes. Labs: ordered. Decision-making details documented in ED Course.  Risk Decision regarding hospitalization. Diagnosis or treatment significantly limited by social determinants of health.   11:44 AM UA noncontributory, he has remained afebrile, awake and alert, completed by his daughter.  I reviewed the urinalysis results, he is had successful placement of Foley catheter, now draining urine, without complication patient  appropriate for follow-up with urology.        Final Clinical Impression(s) / ED Diagnoses Final diagnoses:  Urinary retention    Rx / DC Orders ED Discharge Orders     None         Garrick [place], MD 04/03/23 1144

## 2023-04-03 NOTE — ED Triage Notes (Signed)
 Pt. Stated, I went to urologist on Dec. 26 and gave me a new medication (Levothyroxine) and things have gradually shut down.The last regular peeing was 3-4 days. The last day or 2 its been nothing.

## 2023-04-20 ENCOUNTER — Encounter (HOSPITAL_COMMUNITY): Payer: Self-pay | Admitting: *Deleted

## 2023-04-20 ENCOUNTER — Emergency Department (HOSPITAL_COMMUNITY)
Admission: EM | Admit: 2023-04-20 | Discharge: 2023-04-20 | Discharge status: Against Medical Advice | Payer: PPO | Attending: Emergency Medicine | Admitting: Emergency Medicine

## 2023-04-20 ENCOUNTER — Other Ambulatory Visit: Payer: Self-pay

## 2023-04-20 DIAGNOSIS — R319 Hematuria, unspecified: Secondary | ICD-10-CM | POA: Insufficient documentation

## 2023-04-20 DIAGNOSIS — Z5321 Procedure and treatment not carried out due to patient leaving prior to being seen by health care provider: Secondary | ICD-10-CM | POA: Diagnosis not present

## 2023-04-20 NOTE — ED Triage Notes (Signed)
Old urine bag noted to be full of blood clots; clot noted at the exiting port; new bag placed in triage; light red blood noted to be draining.

## 2023-04-20 NOTE — ED Triage Notes (Signed)
Pt has a leg bag for urinary catheter; reports bag willl not empty due to clots; requesting leg bag be changed; denies abd pain, distention, or urinary retention

## 2023-04-24 ENCOUNTER — Encounter: Payer: Self-pay | Admitting: Internal Medicine

## 2023-04-24 ENCOUNTER — Other Ambulatory Visit: Payer: Self-pay | Admitting: Urology

## 2023-04-30 NOTE — Progress Notes (Signed)
 [facility] PROGRESS NOTE  ASSESSMENT / PLAN: Bryan Morgan is a 83 y.o. male status post EVAR 01/27/21 for 76mm infrarenal abdominal aortic aneurysm noted during workup for urinary retention.  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 Hughes. His aneurysm sac is stable at 75 mm.  We will continue to monitor this with yearly duplex.  SUBJECTIVE: No complaints. Doing well overall.   OBJECTIVE: There were no vitals taken for this visit.  Constitutional: well appearing. no acute distress. CNS: normal gait and station Cardiac: RRR. Pulmonary: unlabored Abdomen: soft, not tender Vascular: 2+ DP pulses     Latest Ref Rng & Units 02/19/2023   11:30 AM 11/30/2022   10:28 AM 11/01/2022   11:04 AM  CBC  WBC 3.8 - 10.8 Thousand/uL 4.8  4.6  4.3   Hemoglobin 13.2 - 17.1 g/dL 86.6  86.1  86.6   Hematocrit 38.5 - 50.0 % 40.7  43.6  39.7   Platelets 140 - 400 Thousand/uL 271  238  242         Latest Ref Rng & Units 02/19/2023   11:30 AM 11/30/2022   10:28 AM 11/01/2022   11:04 AM  CMP  Glucose 65 - 99 mg/dL 85  898  882   BUN 7 - 25 mg/dL 17  15  14    Creatinine 0.70 - 1.22 mg/dL 8.59  8.68  8.80   Sodium 135 - 146 mmol/L 141  135  140   Potassium 3.5 - 5.3 mmol/L 4.4  4.3  4.1   Chloride 98 - 110 mmol/L 103  100  102   CO2 20 - 32 mmol/L 31  27  30    Calcium  8.6 - 10.3 mg/dL 89.5  9.6  9.7   Total Protein 6.1 - 8.1 g/dL 7.4   7.0   Total Bilirubin 0.2 - 1.2 mg/dL 0.5   0.4   AST 10 - 35 U/L 17   16   ALT 9 - 46 U/L 15   19     EVAR duplex shows no evidence of endoleak, but has technical limitations. Sac diameter now 77mm.  Debby SAILOR. Magda, MD [facility] Phone Number: [phone] 04/30/2023 7:41 AM

## 2023-05-01 ENCOUNTER — Encounter: Payer: Self-pay | Admitting: Vascular Surgery

## 2023-05-01 ENCOUNTER — Ambulatory Visit: Payer: PPO | Admitting: Vascular Surgery

## 2023-05-01 ENCOUNTER — Ambulatory Visit (HOSPITAL_COMMUNITY)
Admission: RE | Admit: 2023-05-01 | Discharge: 2023-05-01 | Disposition: A | Discharge status: Home / Self Care | Payer: PPO | Source: Ambulatory Visit | Attending: Vascular Surgery | Admitting: Vascular Surgery

## 2023-05-01 VITALS — BP 138/78 | HR 84 | Temp 98.1°F | Resp 20 | Ht 74.0 in | Wt 219.0 lb

## 2023-05-01 DIAGNOSIS — Z8679 Personal history of other diseases of the circulatory system: Secondary | ICD-10-CM

## 2023-05-01 DIAGNOSIS — I7143 Infrarenal abdominal aortic aneurysm, without rupture: Secondary | ICD-10-CM

## 2023-05-01 DIAGNOSIS — Z9889 Other specified postprocedural states: Secondary | ICD-10-CM

## 2023-05-01 NOTE — Progress Notes (Addendum)
 Anesthesia Review:  PCP: Bryan Morgan LOV 02/19/23    PT was unaware at preop appt that MD is deceased.  Informed pt.   Cardiologist : none  Vascular - Runell Countryman LOV 10/31/22  Chest x-ray : EKG : 06/30/22  Echo : Stress test: Cardiac Cath :  Activity level: can do a flight of stairs without difficutly  Sleep Study/ CPAP : none  Fasting Blood Sugar :      / Checks Blood Sugar -- times a day:   Blood Thinner/ Instructions /Last Dose: ASA / Instructions/ Last Dose :     Visually impaired per pt even with glasses

## 2023-05-02 NOTE — Patient Instructions (Addendum)
 SURGICAL WAITING ROOM VISITATION  Patients having surgery or a procedure may have no more than 2 support people in the waiting area - these visitors may rotate.    Children under the age of 84 must have an adult with them who is not the patient.  Due to an increase in RSV and influenza rates and associated hospitalizations, children ages 53 and under may not visit patients in [facility] hospitals.  Visitors with respiratory illnesses are discouraged from visiting and should remain at home.  If the patient needs to stay at the hospital during part of their recovery, the visitor guidelines for inpatient rooms apply. Pre-op nurse will coordinate an appropriate time for 1 support person to accompany patient in pre-op.  This support person may not rotate.    Please refer to the [facility] website for the visitor guidelines for Inpatients (after your surgery is over and you are in a regular room).       Your procedure is scheduled on2/28/2025     Report to [facility] Main Entrance    Report to admitting at   0930AM   Call this number if you have problems the morning of surgery [phone]   Do not eat food or drink liquids :After Midnight.               If you have questions, please contact your surgeon's office.      Oral Hygiene is also important to reduce your risk of infection.                                    Remember - BRUSH YOUR TEETH THE MORNING OF SURGERY WITH YOUR REGULAR TOOTHPASTE  DENTURES WILL BE REMOVED PRIOR TO SURGERY PLEASE DO NOT APPLY "Poly grip" OR ADHESIVES!!!   Do NOT smoke after Midnight   Stop all vitamins and herbal supplements 7 days before surgery.   Take these medicines the morning of surgery with A SIP OF WATER : proscar   DO NOT TAKE ANY ORAL DIABETIC MEDICATIONS DAY OF YOUR SURGERY  Bring CPAP mask and tubing day of surgery.                              You may not have any metal on your body including hair pins, jewelry, and  body piercing             Do not wear make-up, lotions, powders, perfumes/cologne, or deodorant  Do not wear nail polish including gel and S&S, artificial/acrylic nails, or any other type of covering on natural nails including finger and toenails. If you have artificial nails, gel coating, etc. that needs to be removed by a nail salon please have this removed prior to surgery or surgery may need to be canceled/ delayed if the surgeon/ anesthesia feels like they are unable to be safely monitored.   Do not shave  48 hours prior to surgery.               Men may shave face and neck.   Do not bring valuables to the hospital. [facility] IS NOT             RESPONSIBLE   FOR VALUABLES.   Contacts, glasses, dentures or bridgework may not be worn into surgery.   Bring small overnight bag day of surgery.   DO NOT BRING  YOUR HOME MEDICATIONS TO THE HOSPITAL. PHARMACY WILL DISPENSE MEDICATIONS LISTED ON YOUR MEDICATION LIST TO YOU DURING YOUR ADMISSION IN THE HOSPITAL!    Patients discharged on the day of surgery will not be allowed to drive home.  Someone NEEDS to stay with you for the first 24 hours after anesthesia.   Special Instructions: Bring a copy of your healthcare power of attorney and living will documents the day of surgery if you haven't scanned them before.              Please read over the following fact sheets you were given: IF YOU HAVE QUESTIONS ABOUT YOUR PRE-OP INSTRUCTIONS PLEASE CALL [phone]   If you received a COVID test during your pre-op visit  it is requested that you wear a mask when out in public, stay away from anyone that may not be feeling well and notify your surgeon if you develop symptoms. If you test positive for Covid or have been in contact with anyone that has tested positive in the last 10 days please notify you surgeon.    [place] - Preparing for Surgery Before surgery, you can play an important role.  Because skin is not sterile, your skin needs to  be as free of germs as possible.  You can reduce the number of germs on your skin by washing with CHG (chlorahexidine gluconate) soap before surgery.  CHG is an antiseptic cleaner which kills germs and bonds with the skin to continue killing germs even after washing. Please DO NOT use if you have an allergy to CHG or antibacterial soaps.  If your skin becomes reddened/irritated stop using the CHG and inform your nurse when you arrive at Short Stay. Do not shave (including legs and underarms) for at least 48 hours prior to the first CHG shower.  You may shave your face/neck. Please follow these instructions carefully:  1.  Shower with CHG Soap the night before surgery and the  morning of Surgery.  2.  If you choose to wash your hair, wash your hair first as usual with your  normal  shampoo.  3.  After you shampoo, rinse your hair and body thoroughly to remove the  shampoo.                           4.  Use CHG as you would any other liquid soap.  You can apply chg directly  to the skin and wash                       Gently with a scrungie or clean washcloth.  5.  Apply the CHG Soap to your body ONLY FROM THE NECK DOWN.   Do not use on face/ open                           Wound or open sores. Avoid contact with eyes, ears mouth and genitals (private parts).                       Wash face,  Genitals (private parts) with your normal soap.             6.  Wash thoroughly, paying special attention to the area where your surgery  will be performed.  7.  Thoroughly rinse your body with warm water  from the neck down.  8.  DO NOT  shower/wash with your normal soap after using and rinsing off  the CHG Soap.                9.  Pat yourself dry with a clean towel.            10.  Wear clean pajamas.            11.  Place clean sheets on your bed the night of your first shower and do not  sleep with pets. Day of Surgery : Do not apply any lotions/deodorants the morning of surgery.  Please wear clean clothes to  the hospital/surgery center.  FAILURE TO FOLLOW THESE INSTRUCTIONS MAY RESULT IN THE CANCELLATION OF YOUR SURGERY PATIENT SIGNATURE_________________________________  NURSE SIGNATURE__________________________________  ________________________________________________________________________

## 2023-05-07 ENCOUNTER — Encounter (HOSPITAL_COMMUNITY): Payer: Self-pay

## 2023-05-07 ENCOUNTER — Other Ambulatory Visit: Payer: Self-pay

## 2023-05-07 ENCOUNTER — Encounter (HOSPITAL_COMMUNITY)
Admission: RE | Admit: 2023-05-07 | Discharge: 2023-05-07 | Disposition: A | Discharge status: Home / Self Care | Payer: PPO | Source: Ambulatory Visit | Attending: Urology

## 2023-05-07 VITALS — BP 149/81 | HR 91 | Temp 98.4°F | Resp 16 | Ht 74.0 in | Wt 213.0 lb

## 2023-05-07 DIAGNOSIS — Z01812 Encounter for preprocedural laboratory examination: Secondary | ICD-10-CM | POA: Insufficient documentation

## 2023-05-07 DIAGNOSIS — Z01818 Encounter for other preprocedural examination: Secondary | ICD-10-CM

## 2023-05-07 LAB — CBC
HCT: 37.2 % — ABNORMAL LOW (ref 39.0–52.0)
Hemoglobin: 11.9 g/dL — ABNORMAL LOW (ref 13.0–17.0)
MCH: 29.9 pg (ref 26.0–34.0)
MCHC: 32 g/dL (ref 30.0–36.0)
MCV: 93.5 fL (ref 80.0–100.0)
Platelets: 278 10*3/uL (ref 150–400)
RBC: 3.98 MIL/uL — ABNORMAL LOW (ref 4.22–5.81)
RDW: 13.5 % (ref 11.5–15.5)
WBC: 7.3 10*3/uL (ref 4.0–10.5)
nRBC: 0 % (ref 0.0–0.2)

## 2023-05-07 LAB — BASIC METABOLIC PANEL
Anion gap: 13 (ref 5–15)
BUN: 15 mg/dL (ref 8–23)
CO2: 22 mmol/L (ref 22–32)
Calcium: 9.5 mg/dL (ref 8.9–10.3)
Chloride: 101 mmol/L (ref 98–111)
Creatinine, Ser: 1.38 mg/dL — ABNORMAL HIGH (ref 0.61–1.24)
GFR, Estimated: 51 mL/min — ABNORMAL LOW (ref >60)
Glucose, Bld: 180 mg/dL — ABNORMAL HIGH (ref 70–99)
Potassium: 3.9 mmol/L (ref 3.5–5.1)
Sodium: 136 mmol/L (ref 135–145)

## 2023-05-11 ENCOUNTER — Other Ambulatory Visit: Payer: Self-pay

## 2023-05-11 DIAGNOSIS — Z8679 Personal history of other diseases of the circulatory system: Secondary | ICD-10-CM

## 2023-05-11 NOTE — Progress Notes (Signed)
Anesthesia Chart Review  Case: [id number] Date/Time: 05/25/23 1115   Procedure: TRANSURETHRAL WATERJET ABLATION OF PROSTATE - 90 MINUTE CASE   Anesthesia type: General   Pre-op diagnosis: BENIGN PROSTATIC HYPERPLASIA   Location: [facility] PROCEDURE [facility] / [facility]   Surgeons: Jerilee Field, MD       DISCUSSION:82 y.o. never smoker with h/o AAA, BPH scheduled for above procedure 05/25/2023 with Dr. Jerilee Field.   Pt s/p EVAR 01/27/2021 for AAA.  Last seen by vascular surgeon 05/01/2023. 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 Milford Cage. His aneurysm sac is stable at 75 mm.  We will continue to monitor this with yearly duplex."  VS: There were no vitals taken for this visit.  PROVIDERS: [name], MD former PCP   LABS: Labs reviewed: Acceptable for surgery. (all labs ordered are listed, but only abnormal results are displayed)  Labs Reviewed - No data to display   IMAGES: VAS [place] 05/07/2023 Summary:  Abdominal Aorta: The largest aortic sac diameter has increased compared to  prior exam. Previous diameter measurement was 7.6 cm obtained on  05/23/2022. Endoleak not visualized likely due to significant bowel gas  interference.   EKG:   CV:  Past Medical History:  Diagnosis Date   AAA (abdominal aortic aneurysm) (HCC)    BPH (benign prostatic hyperplasia)    with retention, s/p foley catheter placement in [facility] ED 12/29/20   Enlarged prostate    History of kidney stones    History of needle biopsy of prostate with negative result    Legal blindness    Positive colorectal cancer screening using Cologuard test 08/20/2018   08/18/2018 - GI referral placed; tubular & tubulovillous adenomas 09/12/18 colonoscopy, 3 year f/u rec    Past Surgical History:  Procedure Laterality Date   ABDOMINAL AORTIC ENDOVASCULAR STENT GRAFT N/A 01/27/2021   Procedure: ABDOMINAL AORTIC ENDOVASCULAR STENT GRAFT REPAIR;  Surgeon: [name], MD;  Location: [facility] OR;  Service: Vascular;  Laterality: N/A;   CATARACT EXTRACTION, BILATERAL  2007   CYSTOSCOPY N/A 03/29/2021   Procedure: CYSTOSCOPY;  Surgeon: Belva Agee, MD;  Location: [facility]S;  Service: Urology;  Laterality: N/A;   IR RADIOLOGIST EVAL & MGMT  11/17/2022   TRANSURETHRAL RESECTION OF PROSTATE N/A 03/29/2021   Procedure: TRANSURETHRAL RESECTION OF THE PROSTATE (TURP);  Surgeon: Belva Agee, MD;  Location: [facility];  Service: Urology;  Laterality: N/A;   ULTRASOUND GUIDANCE FOR VASCULAR ACCESS Bilateral 01/27/2021   Procedure: ULTRASOUND GUIDANCE FOR VASCULAR ACCESS, BILATERAL FEMORAL ARTERIES;  Surgeon: [name], MD;  Location: [facility] OR;  Service: Vascular;  Laterality: Bilateral;    MEDICATIONS: No current facility-administered medications for this encounter.    amoxicillin (AMOXIL) 875 MG tablet   Cholecalciferol (VITAMIN D-3 PO)   finasteride (PROSCAR) 5 MG tablet   levothyroxine (SYNTHROID) 100 MCG tablet   magnesium citrate SOLN   tamsulosin (FLOMAX) 0.4 MG CAPS capsule     Jodell Cipro Ward, PA-C [facility] Pre-Surgical Testing [phone]

## 2023-05-23 NOTE — H&P (Signed)
 Office Visit Report     05/10/2023   --------------------------------------------------------------------------------   Bryan Morgan  MRN: [id number]  DOB: [birth year], 83 year old Male  SSN: [id number]   PRIMARY CARE:  [name], MD  PRIMARY CARE FAX:  [phone]  REFERRING:  Jannifer Hick, MD  PROVIDER:  Jerilee Field, M.D.  LOCATION:  [facility] [phone]     --------------------------------------------------------------------------------   CC/HPI: F/u -   1) PSA elevation-patient had a PSA of 7.5 in 2000. His PSA was 9.4 in 2010. He underwent a prostate biopsy which was benign with an 86 g prostate. His PSA was 10 in 2016. His April 2024 PSA was 12.   2) BPH-patient had 86 g prostate on imaging in 2010. He developed urinary retention in 2022. He had 1700 cc drained from the bladder. CT showed a punctate left kidney stone and bilateral renal cysts up to 8 cm. Also BPH. A 7.5 cm AAA. He started silodosin and finasteride. He failed a voiding trial. 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 Cystoscopy revealed marked by lobar prostatic hypertrophy. He underwent a TURP in January 2023. He stopped silodosin but continued finasteride but then stopped. Follow-up PVR was 274 but rose to the 500s. He was started on tamsulosin in 2023. Follow-up PVR 358.   August 2024 CT angio showed about 100g prostate with a moderately distended bladder. No hydronephrosis. Bilateral renal cysts. His residual rose to 488 in 2024 and repeated December 2024 it has risen to greater than 558 mL. He continued tamsulosin. Started him on finasteride. He went into retention. He failed a voiding trial Jan 2025. Cystoscopy today - Jan 2025 - with obstructing lateral lobes. No median lobe. Filled to 400 cc and could not void. He felt the urge to void and I removed the scope but he could not void.   Today, seen for the above. Pt was complaining of urine not draining from catheter  into the bag. He was seen by Dr Cardell Peach on 2/6 and Dr Cardell Peach felt like the catheter had come down into his prostatic urethra. Lauren deflated the balloon and pushed catheter up to the hub and drained of urine. She inflated the balloon with 12ml of sterile water to try to prevent the catheter from sliding down. He rolled his best game of the season last week at 201.   He was at [employer]. He is visually impaired. He can see well but doesn't drive. He bowls. He rolled a 279.     ALLERGIES: No Allergies    MEDICATIONS: Finasteride 5 mg tablet 1 tablet PO Daily  Tamsulosin Hcl 0.4 mg capsule 1 capsule PO Daily  Vitamin D3     GU PSH: Complex cystometrogram, w/ void pressure and urethral pressure profile studies, any technique - 02/25/2021 Complex Uroflow - 02/25/2021 Cystoscopy - 04/12/2023, 03/11/2021 Cystoscopy TURP - 2023 Emg surf Electrd - 02/25/2021 Inject For cystogram - 02/25/2021 Intrabd voidng Press - 02/25/2021 Prostate Needle Biopsy - 2010       PSH Notes: Biopsy Of The Prostate Needle, Cataract Surgery   NON-GU PSH: Visit Complexity (formerly GPC1X) - 04/11/2023, 03/22/2023     GU PMH: BPH w/LUTS - 05/03/2023, Discussed he has a large prostate on imaging. Looking at the operative note I wonder if more the median lobe resectedin 2023. He still had significant lateral lobe hypertrophy today. We discussed the nature risk and benefits of continuing tamsulosin and finasteride. F/u void trial. We went  over the nature risk and benefits of aquablation/TURP. He will proceed. Discussed he may not reach treatment goals of catheter free due to bladder failure. 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 Most are catheter free., - 04/12/2023, - 04/11/2023, Discussed concern that he is developing urinary retention and could decompensate. Discussed the nature risk and benefits of  finasteride or another procedure. All questions answered. Finasteride restarted. , - 03/22/2023, - 09/13/2022, - 03/16/2022, - 09/14/2021, - 08/03/2021, - 2023, - 03/11/2021, - 01/21/2021, Benign prostatic hyperplasia with urinary obstruction, - 2014 Incomplete bladder emptying - 05/03/2023, - 04/12/2023, - 03/22/2023, - 09/13/2022, - 03/16/2022, - 09/14/2021, - 08/03/2021 Urinary Retention - 05/03/2023, - 04/11/2023, - 2023, - 2023, - 03/11/2021, - 02/25/2021, - 02/15/2021, - 02/01/2021, - 01/21/2021 Elevated PSA, We discussed the nature, risks and benefits of PSA screening as well as the nature of elevated PSA (benign versus malignant). We discussed the possibility of prostate cancer and discussed PSA cutoff levels for screening, for age, for PCa risk stratification. We discussed the management of prostate cancer might include active surveillance or treatment depending on patient and cancer characteristics. In that context, we discussed the nature, risks and benefits of continued surveillance, other lab tests, transrectal ultrasound/prostate biopsy, or prostate MRI. All questions answered. PSA was sent and will consider follow-up MRI if PSA doesn't drop appropriately. - 03/22/2023, Elevated prostate specific antigen (PSA), - 2016 Renal cyst - 09/13/2022, - 09/14/2021, - 01/21/2021 Other microscopic hematuria, Microscopic hematuria - 2016 CIS of prostate/prostatic urethra, PIN III (prostatic intraepithelial neoplasia III) - 9 Male ED, unspecified, Erectile dysfunction - 2015 Primary hypogonadism, Hypogonadism, testicular - 2014      PMH Notes:  2009-08-25 09:00:55 - Note: No Medical Problems   NON-GU PMH: Encounter for general adult medical examination without abnormal findings, Encounter for preventive health examination - 2016    FAMILY HISTORY: Family Health Status Number - Runs In Family No pertinent family history - Other   SOCIAL HISTORY: Marital Status: Married Race: Black or African American      Notes: Never A Smoker, Alcohol Use, Marital History - Currently Married, Occupation:, Tobacco Use, Caffeine Use   REVIEW OF SYSTEMS:    GU Review Male:   Patient denies frequent urination, hard to postpone urination, burning/ pain with urination, get up at night to urinate, leakage of urine, stream starts and stops, trouble starting your stream, have to strain to urinate , erection problems, and penile pain.  Gastrointestinal (Upper):   Patient denies nausea, vomiting, and indigestion/ heartburn.  Gastrointestinal (Lower):   Patient denies diarrhea and constipation.  Constitutional:   Patient denies fever, night sweats, weight loss, and fatigue.  Skin:   Patient denies skin rash/ lesion and itching.  Eyes:   Patient denies blurred vision and double vision.  Ears/ Nose/ Throat:   Patient denies sore throat and sinus problems.  Hematologic/Lymphatic:   Patient denies swollen glands and easy bruising.  Cardiovascular:   Patient denies leg swelling and chest pains.  Respiratory:   Patient denies cough and shortness of breath.  Endocrine:   Patient denies excessive thirst.  Musculoskeletal:   Patient denies back pain and joint pain.  Neurological:   Patient denies headaches and dizziness.  Psychologic:   Patient denies depression and anxiety.   VITAL SIGNS: None   MULTI-SYSTEM PHYSICAL EXAMINATION:    Constitutional: Well-nourished. No physical deformities. Normally developed. Good grooming.  Neck: Neck symmetrical, not swollen. Normal tracheal position.  Respiratory: No labored breathing, no use of accessory muscles.   Cardiovascular: Normal temperature, normal extremity pulses, no swelling, no varicosities.  Skin: No paleness, no jaundice, no cyanosis. No lesion, no ulcer, no rash.  Neurologic / Psychiatric: Oriented to time, oriented to place, oriented to person. No depression, no anxiety, no agitation.  Gastrointestinal: No mass, no tenderness, no rigidity, non obese abdomen.      Complexity of Data:   03/22/23 10/28/14 05/11/14 05/09/13 05/08/12 04/17/11 04/01/10 08/17/09  PSA  Total PSA 12.90 ng/mL 9.93  10.14  9.79  8.30  8.48  7.24  7.60   Free PSA    2.12  1.38  1.70  1.3  0.90   % Free PSA    22  17  20  18   11.8     PROCEDURES:          Visit Complexity - G2211 Chronic management   ASSESSMENT:      ICD-10 Details  1 GU:   BPH w/LUTS - N40.1 Chronic, Stable - cont foley. planning for bph surgery soon.   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 Oneta Rack         Next Appointment:      Next Appointment: 05/25/2023 11:30 AM    Appointment Type: Surgery     Location: [facility] [phone]    Provider: Jerilee Field, M.D.    Reason for Visit: OP--[facility]--AQUABLATION      * Signed by Jerilee Field, M.D. on 05/10/23 at 3:36 PM (EST)*     ADD: Given the foley catheter - I started him on cephalexin to decrease the expected bacteruria.

## 2023-05-25 ENCOUNTER — Ambulatory Visit (HOSPITAL_COMMUNITY): Payer: PPO | Admitting: Physician Assistant

## 2023-05-25 ENCOUNTER — Encounter (HOSPITAL_COMMUNITY)
Admission: RE | Disposition: A | Discharge status: Home / Self Care | Payer: Self-pay | Source: Home / Self Care | Attending: Urology

## 2023-05-25 ENCOUNTER — Observation Stay (HOSPITAL_COMMUNITY)
Admission: RE | Admit: 2023-05-25 | Discharge: 2023-05-26 | Disposition: A | Discharge status: Home / Self Care | Payer: PPO | Attending: Urology | Admitting: Urology

## 2023-05-25 ENCOUNTER — Encounter (HOSPITAL_COMMUNITY): Payer: Self-pay | Admitting: Urology

## 2023-05-25 ENCOUNTER — Other Ambulatory Visit: Payer: Self-pay

## 2023-05-25 ENCOUNTER — Ambulatory Visit (HOSPITAL_BASED_OUTPATIENT_CLINIC_OR_DEPARTMENT_OTHER): Payer: PPO | Admitting: Physician Assistant

## 2023-05-25 DIAGNOSIS — N401 Enlarged prostate with lower urinary tract symptoms: Principal | ICD-10-CM | POA: Diagnosis present

## 2023-05-25 DIAGNOSIS — Z01818 Encounter for other preprocedural examination: Secondary | ICD-10-CM

## 2023-05-25 DIAGNOSIS — N138 Other obstructive and reflux uropathy: Principal | ICD-10-CM | POA: Diagnosis present

## 2023-05-25 DIAGNOSIS — R3912 Poor urinary stream: Secondary | ICD-10-CM | POA: Diagnosis not present

## 2023-05-25 DIAGNOSIS — R3914 Feeling of incomplete bladder emptying: Secondary | ICD-10-CM | POA: Insufficient documentation

## 2023-05-25 DIAGNOSIS — R350 Frequency of micturition: Secondary | ICD-10-CM

## 2023-05-25 DIAGNOSIS — N189 Chronic kidney disease, unspecified: Secondary | ICD-10-CM

## 2023-05-25 DIAGNOSIS — I129 Hypertensive chronic kidney disease with stage 1 through stage 4 chronic kidney disease, or unspecified chronic kidney disease: Secondary | ICD-10-CM | POA: Diagnosis not present

## 2023-05-25 DIAGNOSIS — N182 Chronic kidney disease, stage 2 (mild): Secondary | ICD-10-CM | POA: Diagnosis not present

## 2023-05-25 DIAGNOSIS — E1122 Type 2 diabetes mellitus with diabetic chronic kidney disease: Secondary | ICD-10-CM | POA: Insufficient documentation

## 2023-05-25 DIAGNOSIS — Z79899 Other long term (current) drug therapy: Secondary | ICD-10-CM | POA: Diagnosis not present

## 2023-05-25 SURGERY — ABLATION, PROSTATE, TRANSURETHRAL, USING WATERJET
Anesthesia: General | Site: Prostate

## 2023-05-25 MED ORDER — PROPOFOL 10 MG/ML IV BOLUS
INTRAVENOUS | Status: AC
  Filled 2023-05-25: qty 20

## 2023-05-25 MED ORDER — LIDOCAINE HCL (PF) 2 % IJ SOLN
INTRAMUSCULAR | Status: DC | PRN
  Administered 2023-05-25: 100 mg via INTRADERMAL

## 2023-05-25 MED ORDER — TRANEXAMIC ACID-NACL 1000-0.7 MG/100ML-% IV SOLN
1000.0000 mg | INTRAVENOUS | Status: AC
  Administered 2023-05-25: 1000 mg via INTRAVENOUS
  Filled 2023-05-25: qty 100

## 2023-05-25 MED ORDER — SODIUM CHLORIDE 0.9% FLUSH
3.0000 mL | Freq: Two times a day (BID) | INTRAVENOUS | Status: DC
  Administered 2023-05-25: 10 mL via INTRAVENOUS
  Administered 2023-05-26: 3 mL via INTRAVENOUS

## 2023-05-25 MED ORDER — ZOLPIDEM TARTRATE 5 MG PO TABS: 5.0000 mg | ORAL_TABLET | Freq: Every evening | ORAL | Status: DC | PRN

## 2023-05-25 MED ORDER — CEPHALEXIN 500 MG PO CAPS: 500.0000 mg | ORAL_CAPSULE | Freq: Every day | ORAL | Status: DC

## 2023-05-25 MED ORDER — LABETALOL HCL 5 MG/ML IV SOLN
5.0000 mg | INTRAVENOUS | Status: DC | PRN
  Administered 2023-05-25: 5 mg via INTRAVENOUS

## 2023-05-25 MED ORDER — PHENYLEPHRINE 80 MCG/ML (10ML) SYRINGE FOR IV PUSH (FOR BLOOD PRESSURE SUPPORT)
PREFILLED_SYRINGE | INTRAVENOUS | Status: DC | PRN
  Administered 2023-05-25 (×2): 80 ug via INTRAVENOUS

## 2023-05-25 MED ORDER — LABETALOL HCL 5 MG/ML IV SOLN
INTRAVENOUS | Status: AC
  Filled 2023-05-25: qty 4

## 2023-05-25 MED ORDER — OXYCODONE HCL 5 MG/5ML PO SOLN: 5.0000 mg | Freq: Once | ORAL | Status: DC | PRN

## 2023-05-25 MED ORDER — ACETAMINOPHEN 325 MG PO TABS: 650.0000 mg | ORAL_TABLET | ORAL | Status: DC | PRN

## 2023-05-25 MED ORDER — 0.9 % SODIUM CHLORIDE (POUR BTL) OPTIME
TOPICAL | Status: DC | PRN
  Administered 2023-05-25: 1000 mL

## 2023-05-25 MED ORDER — OXYCODONE HCL 5 MG PO TABS: 5.0000 mg | ORAL_TABLET | Freq: Once | ORAL | Status: DC | PRN

## 2023-05-25 MED ORDER — STERILE WATER FOR IRRIGATION IR SOLN
Status: DC | PRN
  Administered 2023-05-25: 500 mL

## 2023-05-25 MED ORDER — FENTANYL CITRATE PF 50 MCG/ML IJ SOSY: 25.0000 ug | PREFILLED_SYRINGE | INTRAMUSCULAR | Status: DC | PRN

## 2023-05-25 MED ORDER — ACETAMINOPHEN 500 MG PO TABS
1000.0000 mg | ORAL_TABLET | Freq: Once | ORAL | Status: AC
  Administered 2023-05-25: 1000 mg via ORAL
  Filled 2023-05-25: qty 2

## 2023-05-25 MED ORDER — ROCURONIUM BROMIDE 10 MG/ML (PF) SYRINGE
PREFILLED_SYRINGE | INTRAVENOUS | Status: DC | PRN
  Administered 2023-05-25: 10 mg via INTRAVENOUS
  Administered 2023-05-25: 60 mg via INTRAVENOUS

## 2023-05-25 MED ORDER — FINASTERIDE 5 MG PO TABS
5.0000 mg | ORAL_TABLET | Freq: Every day | ORAL | Status: DC
  Administered 2023-05-26: 5 mg via ORAL
  Filled 2023-05-25: qty 1

## 2023-05-25 MED ORDER — ONDANSETRON HCL 4 MG/2ML IJ SOLN
INTRAMUSCULAR | Status: AC
  Filled 2023-05-25: qty 2

## 2023-05-25 MED ORDER — PROPOFOL 10 MG/ML IV BOLUS
INTRAVENOUS | Status: DC | PRN
  Administered 2023-05-25: 50 mg via INTRAVENOUS
  Administered 2023-05-25: 100 mg via INTRAVENOUS

## 2023-05-25 MED ORDER — SODIUM CHLORIDE 0.9% FLUSH: 3.0000 mL | INTRAVENOUS | Status: DC | PRN

## 2023-05-25 MED ORDER — LACTATED RINGERS IV SOLN: INTRAVENOUS | Status: DC

## 2023-05-25 MED ORDER — DEXAMETHASONE SODIUM PHOSPHATE 10 MG/ML IJ SOLN
INTRAMUSCULAR | Status: DC | PRN
  Administered 2023-05-25: 4 mg via INTRAVENOUS

## 2023-05-25 MED ORDER — CHLORHEXIDINE GLUCONATE CLOTH 2 % EX PADS
6.0000 | MEDICATED_PAD | Freq: Every day | CUTANEOUS | Status: DC
  Administered 2023-05-26: 6 via TOPICAL

## 2023-05-25 MED ORDER — SUGAMMADEX SODIUM 200 MG/2ML IV SOLN
INTRAVENOUS | Status: AC
  Filled 2023-05-25: qty 2

## 2023-05-25 MED ORDER — DOCUSATE SODIUM 100 MG PO CAPS
100.0000 mg | ORAL_CAPSULE | Freq: Two times a day (BID) | ORAL | Status: DC
  Administered 2023-05-25 – 2023-05-26 (×2): 100 mg via ORAL
  Filled 2023-05-25 (×2): qty 1

## 2023-05-25 MED ORDER — LEVOTHYROXINE SODIUM 100 MCG PO TABS
100.0000 ug | ORAL_TABLET | Freq: Every day | ORAL | Status: DC
  Filled 2023-05-25: qty 1

## 2023-05-25 MED ORDER — LIDOCAINE HCL (PF) 2 % IJ SOLN
INTRAMUSCULAR | Status: AC
  Filled 2023-05-25: qty 5

## 2023-05-25 MED ORDER — OXYBUTYNIN CHLORIDE 5 MG PO TABS: 5.0000 mg | ORAL_TABLET | Freq: Three times a day (TID) | ORAL | Status: DC | PRN

## 2023-05-25 MED ORDER — CHLORHEXIDINE GLUCONATE 0.12 % MT SOLN
15.0000 mL | Freq: Once | OROMUCOSAL | Status: AC
  Administered 2023-05-25: 15 mL via OROMUCOSAL

## 2023-05-25 MED ORDER — AMISULPRIDE (ANTIEMETIC) 5 MG/2ML IV SOLN: 10.0000 mg | Freq: Once | INTRAVENOUS | Status: DC | PRN

## 2023-05-25 MED ORDER — FENTANYL CITRATE (PF) 100 MCG/2ML IJ SOLN
INTRAMUSCULAR | Status: AC
  Filled 2023-05-25: qty 2

## 2023-05-25 MED ORDER — SUGAMMADEX SODIUM 200 MG/2ML IV SOLN
INTRAVENOUS | Status: DC | PRN
  Administered 2023-05-25: 200 mg via INTRAVENOUS

## 2023-05-25 MED ORDER — SODIUM CHLORIDE 0.9 % IR SOLN
3000.0000 mL | Status: DC
  Administered 2023-05-25 – 2023-05-26 (×2): 3000 mL

## 2023-05-25 MED ORDER — ORAL CARE MOUTH RINSE: 15.0000 mL | Freq: Once | OROMUCOSAL | Status: AC

## 2023-05-25 MED ORDER — SODIUM CHLORIDE 0.9 % IV SOLN
2.0000 g | INTRAVENOUS | Status: AC
  Administered 2023-05-25: 2 g via INTRAVENOUS
  Filled 2023-05-25: qty 20

## 2023-05-25 MED ORDER — FENTANYL CITRATE (PF) 100 MCG/2ML IJ SOLN
INTRAMUSCULAR | Status: DC | PRN
Start: 2023-05-25 — End: 2023-05-25
  Administered 2023-05-25: 50 ug via INTRAVENOUS
  Administered 2023-05-25 (×2): 25 ug via INTRAVENOUS

## 2023-05-25 MED ORDER — ONDANSETRON HCL 4 MG/2ML IJ SOLN
INTRAMUSCULAR | Status: DC | PRN
  Administered 2023-05-25: 4 mg via INTRAVENOUS

## 2023-05-25 MED ORDER — DEXAMETHASONE SODIUM PHOSPHATE 10 MG/ML IJ SOLN
INTRAMUSCULAR | Status: AC
  Filled 2023-05-25: qty 1

## 2023-05-25 MED ORDER — SODIUM CHLORIDE 0.9 % IR SOLN
Status: DC | PRN
  Administered 2023-05-25: 6000 mL via INTRAVESICAL

## 2023-05-25 MED ORDER — HYDROCODONE-ACETAMINOPHEN 5-325 MG PO TABS: 1.0000 | ORAL_TABLET | ORAL | Status: DC | PRN

## 2023-05-25 MED ORDER — SODIUM CHLORIDE 0.9 % IV SOLN: 250.0000 mL | INTRAVENOUS | Status: DC | PRN

## 2023-05-25 SURGICAL SUPPLY — 28 items
BAG URINE DRAIN 2000ML AR STRL (UROLOGICAL SUPPLIES) ×1 IMPLANT
BAND RUBBER #18 3X1/16 STRL (MISCELLANEOUS) IMPLANT
CANISTER SUCT 3000ML PPV (MISCELLANEOUS) ×1 IMPLANT
CATH HEMA 3WAY 30CC 22FR COUDE (CATHETERS) IMPLANT
CATH HEMA 3WAY 30CC 24FR COUDE (CATHETERS) IMPLANT
COVER MAYO STAND STRL (DRAPES) ×1 IMPLANT
DRAPE FOOT SWITCH (DRAPES) ×1 IMPLANT
DRAPE SURG IRRIG POUCH 19X23 (DRAPES) IMPLANT
GEL ULTRASOUND 8.5O AQUASONIC (MISCELLANEOUS) ×1 IMPLANT
GLOVE SURG LX STRL 7.5 STRW (GLOVE) ×1 IMPLANT
GOWN STRL REUS W/ TWL XL LVL3 (GOWN DISPOSABLE) ×1 IMPLANT
HANDPIECE AQUABEAM (MISCELLANEOUS) ×1 IMPLANT
HOLDER FOLEY CATH W/STRAP (MISCELLANEOUS) IMPLANT
KIT TURNOVER KIT A (KITS) IMPLANT
LOOP CUT BIPOLAR 24F LRG (ELECTROSURGICAL) IMPLANT
MANIFOLD NEPTUNE II (INSTRUMENTS) ×1 IMPLANT
MAT ABSORB FLUID 56X50 GRAY (MISCELLANEOUS) ×1 IMPLANT
PACK CYSTO (CUSTOM PROCEDURE TRAY) ×1 IMPLANT
PACK DRAPE AQUABEAM (MISCELLANEOUS) ×1 IMPLANT
PAD PREP 24X48 CUFFED NSTRL (MISCELLANEOUS) ×1 IMPLANT
PIN SAFETY STERILE (MISCELLANEOUS) IMPLANT
SYR 30ML LL (SYRINGE) ×1 IMPLANT
SYR TOOMEY IRRIG 70ML (MISCELLANEOUS) ×2 IMPLANT
SYRINGE TOOMEY IRRIG 70ML (MISCELLANEOUS) ×2 IMPLANT
TOWEL OR 17X26 10 PK STRL BLUE (TOWEL DISPOSABLE) ×1 IMPLANT
TUBING CONNECTING 10 (TUBING) ×2 IMPLANT
TUBING UROLOGY SET (TUBING) ×1 IMPLANT
UNDERPAD 30X36 HEAVY ABSORB (UNDERPADS AND DIAPERS) ×1 IMPLANT

## 2023-05-25 NOTE — Interval H&P Note (Signed)
 History and Physical Interval Note:  05/25/2023 12:03 PM  Bryan Morgan  has presented today for surgery, with the diagnosis of BENIGN PROSTATIC HYPERPLASIA.  The various methods of treatment have been discussed with the patient and family. After consideration of risks, benefits and other options for treatment, the patient has consented to  Procedure(s) with comments: TRANSURETHRAL WATERJET ABLATION OF PROSTATE (N/A) - 90 MINUTE CASE as a surgical intervention.  The patient's history has been reviewed, patient examined, no change in status, stable for surgery.  I have reviewed the patient's chart and labs.  He is well today.  He started his preop antibiotics.  He has no bladder pain.  Urine clear.  No fever.  He and his wife asked about retreatment rates given that he had a TURP in 2023.  We discussed his prostate size, his increasing residuals and finally urinary retention.  We went over again the nature risk benefits and alternatives to water jet ablation and how that differs.  We discussed retreatment rates after water jet ablation.  Discussed to the catheter and postop care.  Questions were answered to the patient's satisfaction.  He elects to proceed.   Jerilee Field

## 2023-05-25 NOTE — Progress Notes (Signed)
 Nurse took Parth off traction and then off CBI but urine turned grade 4-5. Will hand irrigate PRN and continue CBI overnight.

## 2023-05-25 NOTE — Anesthesia Preprocedure Evaluation (Addendum)
 Anesthesia Evaluation  Patient identified by MRN, date of birth, ID band Patient awake    Reviewed: Allergy & Precautions, NPO status , Patient's Chart, lab work & pertinent test results  History of Anesthesia Complications Negative for: history of anesthetic complications  Airway Mallampati: II  TM Distance: >3 FB Neck ROM: Full   Comment: Previous grade II view with MAC 4, easy mask Dental  (+) Dental Advisory Given   Pulmonary neg pulmonary ROS   Pulmonary exam normal breath sounds clear to auscultation       Cardiovascular hypertension, (-) angina (-) Past MI, (-) Cardiac Stents and (-) CABG (-) dysrhythmias  Rhythm:Regular Rate:Normal  AAA s/p stent graft, HLD   Neuro/Psych negative neurological ROS     GI/Hepatic Neg liver ROS,GERD  ,,  Endo/Other  diabetes, Type 2Hypothyroidism    Renal/GU CRFRenal disease   BPH    Musculoskeletal   Abdominal   Peds  Hematology negative hematology ROS (+)   Anesthesia Other Findings   Reproductive/Obstetrics                             Anesthesia Physical Anesthesia Plan  ASA: 3  Anesthesia Plan: General   Post-op Pain Management: Tylenol PO (pre-op)*   Induction: Intravenous  PONV Risk Score and Plan: 2 and Ondansetron, Dexamethasone and Treatment may vary due to age or medical condition  Airway Management Planned: Oral ETT  Additional Equipment:   Intra-op Plan:   Post-operative Plan: Extubation in OR  Informed Consent: I have reviewed the patients History and Physical, chart, labs and discussed the procedure including the risks, benefits and alternatives for the proposed anesthesia with the patient or authorized representative who has indicated his/her understanding and acceptance.     Dental advisory given  Plan Discussed with: CRNA and Anesthesiologist  Anesthesia Plan Comments: (Risks of general anesthesia discussed  including, but not limited to, sore throat, hoarse voice, chipped/damaged teeth, injury to vocal cords, nausea and vomiting, allergic reactions, lung infection, heart attack, stroke, and death. All questions answered. )        Anesthesia Quick Evaluation

## 2023-05-25 NOTE — Anesthesia Procedure Notes (Addendum)
 Procedure Name: Intubation Date/Time: 05/25/2023 12:34 PM  Performed by: Sindy Guadeloupe, CRNAPre-anesthesia Checklist: Patient identified, Patient being monitored, Suction available and Emergency Drugs available Patient Re-evaluated:Patient Re-evaluated prior to induction Oxygen Delivery Method: Circle system utilized Preoxygenation: Pre-oxygenation with 100% oxygen Induction Type: IV induction Ventilation: Oral airway inserted - appropriate to patient size and Two handed mask ventilation required Laryngoscope Size: Mac and 4 Grade View: Grade I Tube type: Oral Tube size: 7.5 mm Number of attempts: 1 Airway Equipment and Method: Stylet Placement Confirmation: ETT inserted through vocal cords under direct vision, positive ETCO2 and breath sounds checked- equal and bilateral Secured at: 24 cm Tube secured with: Tape Dental Injury: Teeth and Oropharynx as per pre-operative assessment

## 2023-05-25 NOTE — Op Note (Signed)
 Preoperative diagnosis: BPH with lower urinary tract symptoms, weak stream, frequency  Postoperative diagnosis: Same   Procedure: Robotic water jet ablation of the prostate   Surgeon: Mena Goes   Anesthesia: General   Indication for procedure:   Findings:  EUA penis was circumcised without mass or lesion.  Glans and meatus appeared normal.  He was developing a coronal hypospadias.  Scrotum was normal.  On DRE prostate was about 80 g and smooth without hard area or nodule.  Cystoscopy revealed lateral lobe hypertrophy with obstruction and extension into the bladder several centimeters.  I thought I had to clear off some of the intravesical component to locate the ureteral orifices but then I located them further out.  He had a capacious bladder and the ureteral orifices were well away from the intravesical component.  Description of procedure:  He was brought to the operating room and placed supine on the operating table.  After adequate anesthesia he was placed lithotomy position. Timeout was performed to confirm the patient and procedure. The TRUS Stepper was mounted to the Articulating Arm and secured to OR bed. The ultrasound probe was attached to the stepper. Exam under anesthesia was performed and the TRUS was inserted per rectum.  There was no resistance. The ultrasound probe was aligned, and confirmation made that the prostate is centered and aligned using both transverse and sagittal views. The bladder neck, verumontanum and the central/transition zones were identified.  Genitalia were prepped and draped in the usual sterile fashion. The 58F AQUABEAM Handpiece is inserted into the prostatic urethra and a complete cystoscopic evaluation was performed by inspecting the prostate, bladder, and identifying the location of the verumontanum/external sphincter. The AQUABEAM Handpiece was secured to the Handpiece Articulating Arm. Confirmed alignment of AQUABEAM Handpiece and TRUS Probe to be parallel  and colinear. Confirmation that AQUABEAM nozzle is centered and anterior of the bladder neck or the median lobe. The cystoscope was then retracted to visualize the verumontanum and external sphincter and the cystoscope tip was positioned just proximal to the external sphincter. Reconfirmed alignment of the TRUS probe with the AQUABEAM Handpiece and compression applied with TRUS probe. Horizontal alignment of the Handpiece waterjet nozzle was performed. The Aquablation treatment zones were planned utilizing real-time TRUS to visualize the contour of the prostate and the depth and radial angles of resection were defined in the transverse view. In the sagittal view, the AQUABEAM nozzle is identified and position registered with software. The treatment contours were then adjusted to conform to the intended resection margins. The median lobe, bladder neck and verumontanum were marked and confirmed in the treatment contour. The Aquablation Treatment was then started following the resection contour confirmed under ultrasound guidance. TOTAL AQUABLATION RESECTION TIME: 3 passes: 40 minutes 33 seconds, 3 minutes 58 seconds, 4 minutes 45 seconds.  First 2 passes were under the same plan and then we backed out and replan for the third pass. Once Aquablation resection was complete the 24 French aqua beam handpiece was carefully removed.  The continuous-flow sheath with the visual obturator was passed and then the loop and handle.  There was some residual intravesical component extending into the bladder and distorted on the left side about 5:00 and cleared off some of the intravesical component to locate the bladder neck.  The bladder neck was located.  I continued to look for the ureteral orifices but then located them well further out into the bladder.  Resection of some of the residual median lobe and bladder neck tissue  was done.  The bladder neck was identified at 6:00 and this was taken up to 12:00 with fulguration of  the bladder neck and prostate for hemostasis.  Slight amount of anterior tissue was resected.  Similarly from 6:00 up to 12:00 on the right side of the bladder neck was identified by resecting some of the ablated tissue to identify the bladder neck and cauterize any bleeding.  Some anterior tissue on the left was resected.  This created excellent hemostasis.  All the chips were evacuated.  Ureteral orifices again identified and noted to be normal without injury.  The scope was backed out and a 22 [place] hematuria catheter was placed with 30 cc in the balloon.  The balloon was seated at the bladder neck and it was irrigated on light traction and noted to be clear to pink.  He was hooked up to CBI.  He was cleaned up and placed supine.  Catheter was placed on traction.  He was awakened and taken to the cover room in stable condition.  Complications: None  Blood loss: 100 mL  Specimens: None  Drains: 22 French three-way hematuria catheter with 30 cc in the balloon  Disposition: Patient stable to PACU

## 2023-05-25 NOTE — Transfer of Care (Signed)
 Immediate Anesthesia Transfer of Care Note  Patient: Bryan Morgan  Procedure(s) Performed: TRANSURETHRAL WATERJET ABLATION OF PROSTATE (Prostate)  Patient Location: PACU  Anesthesia Type:General  Level of Consciousness: awake and patient cooperative  Airway & Oxygen Therapy: Patient Spontanous Breathing and Patient connected to face mask  Post-op Assessment: Report given to RN and Post -op Vital signs reviewed and stable  Post vital signs: Reviewed and stable  Last Vitals:  Vitals Value Taken Time  BP 174/97 05/25/23 1410  Temp    Pulse 81 05/25/23 1413  Resp 13 05/25/23 1413  SpO2 100 % 05/25/23 1413  Vitals shown include unfiled device data.  Last Pain:  Vitals:   05/25/23 1028  TempSrc:   PainSc: 0-No pain         Complications: No notable events documented.

## 2023-05-25 NOTE — Discharge Instructions (Addendum)
Robotic water jet ablation of the Prostate, Care After The following information offers guidance on how to care for yourself after your procedure. Your health care provider may also give you more specific instructions. If you have problems or questions, contact your health care provider. What can I expect after the procedure? After the procedure, it is common to have: Mild pain in your lower abdomen. Soreness or mild discomfort in your penis or when you urinate. This is from having the catheter inserted during the procedure. A sudden urge to urinate (urgency). A need to urinate often. A small amount of blood in your urine. You may notice some small blood clots in your urine. These are normal. Follow these instructions at home: Medicines Take over-the-counter and prescription medicines only as told by your health care provider. If you were prescribed an antibiotic medicine, take it as told by your health care provider. Do not stop taking the antibiotic even if you start to feel better. Activity  Rest as told by your health care provider. Avoid sitting for a long time without moving. Get up to take short walks every 1-2 hours. This is important to improve blood flow and breathing. Ask for help if you feel weak or unsteady. You may increase your physical activity gradually as you start to feel better. Do not drive or operate machinery until your health care provider says that it is safe. Do not ride in a car for long periods of time, or as told by your health care provider. Avoid intense physical activity for as long as told by your health care provider. Do not lift anything that is heavier than 10 lb (4.5 kg), or the limit that you are told, until your health care provider says that it is safe. Do not have sex until your health care provider approves. Return to your normal activities as told by your health care provider. Ask your health care provider what activities are safe for you. Preventing  constipation  You may need to take these actions to prevent or treat constipation: Drink enough fluid to keep your urine pale yellow. Take over-the-counter or prescription medicines. Eat foods that are high in fiber, such as beans, whole grains, and fresh fruits and vegetables. Limit foods that are high in fat and processed sugars, such as fried or sweet foods.   General instructions Do not strain when you have a bowel movement. Straining may lead to bleeding from the prostate. This may cause blood clots and trouble urinating. Do not use any products that contain nicotine or tobacco. These products include cigarettes, chewing tobacco, and vaping devices, such as e-cigarettes. If you need help quitting, ask your health care provider. If you go home with a tube draining your urine (urinary catheter), care for the catheter as told by your health care provider. Wear compression stockings as told by your health care provider. These stockings help to prevent blood clots and reduce swelling in your legs. Keep all follow-up visits. This is important. Contact a health care provider if: You have signs of infection, such as: Fever or chills. Urine that smells very bad. Swelling around your urethra that is getting worse. Swelling in your penis or testicles. You have difficulty urinating. You have pain that gets worse or does not improve with medicine. You have blood in your urine that does not go away after 1 week of resting and drinking more fluids. You have trouble having a bowel movement. You have trouble having or keeping an erection. No  semen comes out during orgasm (dry ejaculation). You have a urinary catheter in place, and you have: Spasms or pain. Problems with your catheter or your catheter is blocked. Get help right away if: You are unable to urinate. You are having more blood clots in your urine instead of fewer. You have: Large blood clots. A lot of blood in your urine. Pain in  your back or lower abdomen. You have difficulty breathing or shortness of breath. You develop swelling or pain in your leg. These symptoms may be an emergency. Get help right away. Call 911. Do not wait to see if the symptoms will go away. Do not drive yourself to the hospital. Summary After the procedure, it is common to have a small amount of blood in your urine. Follow restrictions about lifting and sexual activity as told by your health care provider. Ask what activities are safe for you. Keep all follow-up visits. This is important. This information is not intended to replace advice given to you by your health care provider. Make sure you discuss any questions you have with your health care provider. Document Revised: 12/07/2020 Document Reviewed: 12/07/2020 Elsevier Patient Education  2024 [employer].

## 2023-05-26 DIAGNOSIS — N401 Enlarged prostate with lower urinary tract symptoms: Secondary | ICD-10-CM | POA: Diagnosis not present

## 2023-05-26 LAB — HEMOGLOBIN AND HEMATOCRIT, BLOOD
HCT: 38 % — ABNORMAL LOW (ref 39.0–52.0)
Hemoglobin: 12.3 g/dL — ABNORMAL LOW (ref 13.0–17.0)

## 2023-05-26 NOTE — Plan of Care (Signed)

## 2023-05-26 NOTE — Progress Notes (Signed)
 Foley teaching completed- Pt verbalized an understanding that he should switch over foley bag & not sleep w/ leg bag on. Pt switched over to leg bag at this time- extra leg bag and large foley bag. PIV removed as noted. No other questions at this time. Pt dressing for d/c - ride home is waiting at main entrance.

## 2023-05-26 NOTE — Discharge Summary (Signed)
 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   Abnormal TSH 10/02/2020   Mild hypertension 12/26/2019   History of adenomatous polyp of colon 11/04/2018   CKD stage 2 due to type 2 diabetes mellitus (HCC) 08/12/2018   Hyperlipidemia due to type 2 diabetes mellitus (HCC) 08/06/2018   Diabetes mellitus (HCC) 08/06/2018   Vitamin D deficiency 08/06/2018   Benign prostatic hyperplasia without lower urinary tract symptoms 08/05/2018   Elevated PSA 08/05/2018   Gastroesophageal reflux disease without esophagitis 08/05/2018   Overweight (BMI 25.0-29.9) 07/31/2018    History and Physical: For full details, please see admission history and physical. Briefly, Bryan Morgan is a 83 y.o. year old patient with admitted for CBI after aquablation.   Physical Exam Vitals reviewed.  Constitutional:      Appearance: Normal appearance.  Pulmonary:     Effort: Pulmonary effort is normal.  Abdominal:     General: Abdomen is flat.     Palpations: Abdomen is soft.  Genitourinary:    Comments: Foley draining light pink urine Skin:    General: Skin is warm.  Neurological:     General: No focal deficit present.     Mental Status: He is alert and oriented to person, place, and time.  Psychiatric:        Mood and Affect: Mood normal.      Hospital Course: Patient tolerated the procedure well.  He was then transferred to the floor after an uneventful PACU stay.  His hospital course was uncomplicated.  CBI was titrated off and urine remains very  light pink. On POD#1 he had met discharge criteria: was eating a regular diet, was up and ambulating independently,  pain was well controlled,and was ready to for discharge.  He was discharged with a foley in place.     Laboratory values:  Recent Labs    05/26/23 0650  HGB 12.3*  HCT 38.0*   No results for input(s): "NA", "K", "CL", "CO2", "GLUCOSE", "BUN", "CREATININE", "CALCIUM" in the last 72 hours. No results for input(s): "LABPT", "INR" in the last 72 hours. No results for input(s): "LABURIN" in the last 72 hours. Results for orders placed or performed in visit on 06/30/22  MICROSCOPIC MESSAGE     Status: None   Collection Time: 06/30/22 10:10 AM  Result Value Ref Range Status   Note   Final    Comment: This urine was analyzed for the presence of WBC,  RBC, bacteria, casts, and other formed elements.  Only those elements seen were reported. . .     Disposition: Home  Discharge instruction: Routine foley care  Discharge medications:  Allergies as of 05/26/2023   No Known Allergies      Medication List     TAKE these medications    amoxicillin 875 MG tablet Commonly known as: AMOXIL Take 875 mg by mouth 2 (two) times daily.   cephALEXin 500 MG capsule Commonly known as: KEFLEX Take 1 capsule (500 mg total) by mouth daily after supper.   finasteride 5 MG tablet Commonly known as: PROSCAR Take 5 mg by  mouth daily.   levothyroxine 100 MCG tablet Commonly known as: Synthroid Take  1 tablet  Daily  on an empty stomach with only water for 30 minutes & no Antacid meds, Calcium or Magnesium for 4 hours & avoid Biotin What changed:  how much to take how to take this when to take this additional instructions   magnesium citrate Soln Take 30 mLs by mouth at bedtime.   tamsulosin 0.4 MG Caps capsule Commonly known as: FLOMAX Take 0.4 mg by mouth at bedtime.   VITAMIN D-3 PO Take 1 capsule by mouth at bedtime.        Followup:   Follow-up Information      Jerilee Field, MD Follow up on 06/04/2023.   Specialty: Urology Why: at 8:30 AM with NP Derinda Late information: [address] [place] [address] [phone]

## 2023-05-27 NOTE — Anesthesia Postprocedure Evaluation (Signed)
 Anesthesia Post Note  Patient: Bryan Morgan  Procedure(s) Performed: TRANSURETHRAL WATERJET ABLATION OF PROSTATE (Prostate)     Patient location during evaluation: PACU Anesthesia Type: General Level of consciousness: awake Pain management: pain level controlled Vital Signs Assessment: post-procedure vital signs reviewed and stable Respiratory status: spontaneous breathing, nonlabored ventilation and respiratory function stable Cardiovascular status: blood pressure returned to baseline and stable Postop Assessment: no apparent nausea or vomiting Anesthetic complications: no   No notable events documented.  Last Vitals:  Vitals:   05/26/23 0441 05/26/23 0841  BP: 121/82 122/80  Pulse: 92 90  Resp: 20 20  Temp: 36.8 C 36.7 C  SpO2: 96% 97%    Last Pain:  Vitals:   05/26/23 0841  TempSrc: Oral  PainSc:                  Linton Rump

## 2023-05-28 LAB — SURGICAL PATHOLOGY

## 2023-05-31 ENCOUNTER — Encounter: Payer: Self-pay | Admitting: *Deleted

## 2023-07-04 ENCOUNTER — Ambulatory Visit: Payer: PPO | Admitting: Internal Medicine

## 2023-07-05 ENCOUNTER — Encounter: Payer: PPO | Admitting: Internal Medicine

## 2023-07-07 ENCOUNTER — Emergency Department (HOSPITAL_COMMUNITY)
Admission: EM | Admit: 2023-07-07 | Discharge: 2023-07-07 | Disposition: A | Discharge status: Home / Self Care | Attending: Emergency Medicine | Admitting: Emergency Medicine

## 2023-07-07 ENCOUNTER — Other Ambulatory Visit: Payer: Self-pay

## 2023-07-07 DIAGNOSIS — R319 Hematuria, unspecified: Secondary | ICD-10-CM | POA: Insufficient documentation

## 2023-07-07 DIAGNOSIS — T83098A Other mechanical complication of other indwelling urethral catheter, initial encounter: Secondary | ICD-10-CM | POA: Insufficient documentation

## 2023-07-07 DIAGNOSIS — T839XXA Unspecified complication of genitourinary prosthetic device, implant and graft, initial encounter: Secondary | ICD-10-CM

## 2023-07-07 LAB — URINALYSIS, W/ REFLEX TO CULTURE (INFECTION SUSPECTED)
Bilirubin Urine: NEGATIVE
Glucose, UA: NEGATIVE mg/dL
Ketones, ur: NEGATIVE mg/dL
Nitrite: POSITIVE — AB
Protein, ur: 30 mg/dL — AB
Specific Gravity, Urine: 1.002 — ABNORMAL LOW (ref 1.005–1.030)
pH: 8 (ref 5.0–8.0)

## 2023-07-07 NOTE — Consult Note (Signed)
 I have been asked to see the patient by Dr. Paris Bolds, for evaluation and management of gross hematuria.  History of present illness: 83 year old male with a history of aqua ablation on 05/25/2023.  Since that time he has been having gross hematuria.  He was last seen early last week with gross hematuria he was exchanged for a 20 [place] catheter.  He called me earlier today concerns of gross hematuria and catheter not draining so instructed to go to the ER.  In the ER the catheter was exchanged and some clot was removed.  On my inspection the urine was clear patient was not having any pain his abdomen was soft.   Review of systems: A 12 point comprehensive review of systems was obtained and is negative unless otherwise stated in the history of present illness.  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   Current Outpatient Medications on File Prior to Encounter  Medication Sig Dispense Refill   amoxicillin (AMOXIL) 875 MG tablet Take 875 mg by mouth 2 (two) times daily.     cephALEXin (KEFLEX) 500 MG capsule Take  1 capsule (500 mg total) by mouth daily after supper.     Cholecalciferol (VITAMIN D-3 PO) Take 1 capsule by mouth at bedtime.     finasteride (PROSCAR) 5 MG tablet Take 5 mg by mouth daily.     levothyroxine (SYNTHROID) 100 MCG tablet Take  1 tablet  Daily  on an empty stomach with only water for 30 minutes & no Antacid meds, Calcium or Magnesium for 4 hours & avoid Biotin (Patient taking differently: Take 100 mcg by mouth at bedtime.) 90 tablet 1   magnesium citrate SOLN Take 30 mLs by mouth at bedtime.     tamsulosin (FLOMAX) 0.4 MG CAPS capsule Take 0.4 mg by mouth at bedtime.      Past Medical History:  Diagnosis Date   AAA (abdominal aortic aneurysm) (HCC)    BPH (benign prostatic hyperplasia)    with retention, s/p foley catheter placement in [facility] ED 12/29/20   Enlarged prostate    History of kidney stones    History of needle biopsy of prostate with negative result    Legal blindness    Positive colorectal cancer screening using Cologuard test 08/20/2018   08/18/2018 - GI referral placed; tubular & tubulovillous adenomas 09/12/18 colonoscopy, 3 year f/u rec    Past Surgical History:  Procedure Laterality Date   ABDOMINAL AORTIC ENDOVASCULAR STENT GRAFT N/A 01/27/2021   Procedure: ABDOMINAL AORTIC ENDOVASCULAR STENT GRAFT REPAIR;  Surgeon: Carlene Che, MD;  Location: [facility] OR;  Service: Vascular;  Laterality: N/A;   CATARACT EXTRACTION, BILATERAL  2007  CYSTOSCOPY N/A 03/29/2021   Procedure: CYSTOSCOPY;  Surgeon: Sherlyn Ditto, MD;  Location: [facility] ORS;  Service: Urology;  Laterality: N/A;   IR RADIOLOGIST EVAL & MGMT  11/17/2022   TRANSURETHRAL RESECTION OF PROSTATE N/A 03/29/2021   Procedure: TRANSURETHRAL RESECTION OF THE PROSTATE (TURP);  Surgeon: Sherlyn Ditto, MD;  Location: [facility] ORS;  Service: Urology;  Laterality: N/A;   ULTRASOUND GUIDANCE FOR VASCULAR ACCESS Bilateral 01/27/2021   Procedure: ULTRASOUND GUIDANCE FOR VASCULAR ACCESS, BILATERAL FEMORAL ARTERIES;  Surgeon:  Carlene Che, MD;  Location: [facility] OR;  Service: Vascular;  Laterality: Bilateral;    Social History   Tobacco Use   Smoking status: Never    Passive exposure: Never   Smokeless tobacco: Never  Vaping Use   Vaping status: Never Used  Substance Use Topics   Alcohol use: Never   Drug use: No    Family History  Problem Relation Age of Onset   Diabetes Sister    COPD Brother    Diabetes Sister    Colon cancer Neg Hx    Esophageal cancer Neg Hx    Stomach cancer Neg Hx    Pancreatic cancer Neg Hx    Liver disease Neg Hx     PE: Vitals:   07/07/23 1028 07/07/23 1029  BP: (!) 165/78   Pulse: 95   Resp: 20   Temp: 98.9 F (37.2 C)   TempSrc: Oral   SpO2: 100%   Weight:  98.4 kg  Height:  6\' 2"  (1.88 m)   Patient appears to be in no acute distress  patient is alert and oriented x3 Abdomen: Soft nontender nondistended GU: Foley in place clear urine slightly orange tinge  No results for input(s): "WBC", "HGB", "HCT" in the last 72 hours. No results for input(s): "NA", "K", "CL", "CO2", "GLUCOSE", "BUN", "CREATININE", "CALCIUM" in the last 72 hours. No results for input(s): "LABPT", "INR" in the last 72 hours. No results for input(s): "LABURIN" in the last 72 hours. Results for orders placed or performed in visit on 06/30/22  MICROSCOPIC MESSAGE     Status: None   Collection Time: 06/30/22 10:10 AM  Result Value Ref Range Status   Note   Final    Comment: This urine was analyzed for the presence of WBC,  RBC, bacteria, casts, and other formed elements.  Only those elements seen were reported. . .     Imaging: none  Imp: 83 year old male status post aqua ablation on 05/25/2023 with Dr. Derrick Fling.  Presented the ER for gross hematuria upon catheter exchange urine was clear.  Patient will be discharged home and follow-up with urology. ER did get urine culture we will follow-up and treat if positive.  Recommendations:   Thank you for involving me in this  patient's care, I will continue to follow along.Please page with any further questions or concerns. Thelbert Finner

## 2023-07-07 NOTE — ED Triage Notes (Addendum)
 Patient to ED by POV with c/o catheter issue. He states catheter is not draining and he noticed blood from penis. He last had catheter changed on 07/05/23. He reports on 05/25/23 he had a procedure on his prostate. Has appointment on Monday with Urology.

## 2023-07-07 NOTE — Discharge Instructions (Signed)
 Your history and exam and workup today are consistent with resolved Foley catheter problem where it seemed clogged but is now flowing well after flushing.  Urology saw you and feel you are safe for discharge home to see them in clinic.  They did not feel you need antibiotics with the urine results as they are now although they do want a culture.  If it ends up growing concerning antibiotics, they report they will call in antibiotics for you.  They did not me to give antibiotics based on the urinalysis as it is now.  Please rest and stay hydrated and follow-up with them.  If any symptoms change or worsen acutely, please return to the nearest emergency department.

## 2023-07-07 NOTE — ED Provider Notes (Signed)
 [place] EMERGENCY DEPARTMENT AT [facility] Provider Note   CSN: [id number] Arrival date & time: 07/07/23  1024     History  Chief Complaint  Patient presents with   Catheter Issue    Bryan Morgan is a 83 y.o. male.  The history is provided by the patient and medical records. No language interpreter was used.  Hematuria This is a new problem. The current episode started more than 2 days ago. The problem occurs constantly. The problem has not changed since onset.Pertinent negatives include no chest pain, no abdominal pain, no headaches and no shortness of breath. Nothing aggravates the symptoms. Nothing relieves the symptoms. He has tried nothing for the symptoms. The treatment provided no relief.       Home Medications Prior to Admission medications   Medication Sig Start Date End Date Taking? Authorizing Provider  amoxicillin (AMOXIL) 875 MG tablet Take 875 mg by mouth 2 (two) times daily.    [provider]  cephALEXin (KEFLEX) 500 MG capsule Take 1 capsule (500 mg total) by mouth daily after supper. 05/25/23   Christina Coyer, MD  Cholecalciferol (VITAMIN D-3 PO) Take 1 capsule by mouth at bedtime.    [provider]  finasteride (PROSCAR) 5 MG tablet Take 5 mg by mouth daily.    [provider]  levothyroxine (SYNTHROID) 100 MCG tablet Take  1 tablet  Daily  on an empty stomach with only water for 30 minutes & no Antacid meds, Calcium or Magnesium for 4 hours & avoid Biotin Patient taking differently: Take 100 mcg by mouth at bedtime. 02/20/23   Vangie Genet, MD  magnesium citrate SOLN Take 30 mLs by mouth at bedtime.    [provider]  tamsulosin (FLOMAX) 0.4 MG CAPS capsule Take 0.4 mg by mouth at bedtime. 04/07/22   [provider]      Allergies    Patient has no known allergies.    Review of Systems   Review of Systems  Constitutional:  Negative for chills, fatigue and fever.  HENT:  Negative  for congestion.   Respiratory:  Negative for cough, chest tightness and shortness of breath.   Cardiovascular:  Negative for chest pain.  Gastrointestinal:  Negative for abdominal pain, constipation, diarrhea, nausea and vomiting.  Genitourinary:  Positive for difficulty urinating and hematuria. Negative for dysuria, flank pain, penile discharge, penile pain, penile swelling, scrotal swelling and testicular pain.  Musculoskeletal:  Negative for back pain, neck pain and neck stiffness.  Skin:  Negative for rash and wound.  Neurological:  Negative for light-headedness and headaches.  Psychiatric/Behavioral:  Negative for agitation.   All other systems reviewed and are negative.   Physical Exam Updated Vital Signs BP (!) 165/78 (BP Location: Left Arm)   Pulse 95   Temp 98.9 F (37.2 C) (Oral)   Resp 20   Ht 6\' 2"  (1.88 m)   Wt 98.4 kg   SpO2 100%   BMI 27.86 kg/m  Physical Exam Vitals and nursing note reviewed. Exam conducted with a chaperone present.  Constitutional:      General: He is not in acute distress.    Appearance: He is well-developed.  HENT:     Head: Normocephalic and atraumatic.  Eyes:     Conjunctiva/sclera: Conjunctivae normal.  Cardiovascular:     Rate and Rhythm: Normal rate and regular rhythm.  Pulmonary:     Effort: Pulmonary effort is normal. No respiratory distress.     Breath sounds: Normal  breath sounds.  Abdominal:     Palpations: Abdomen is soft.     Tenderness: There is no abdominal tenderness.     Hernia: There is no hernia in the left inguinal area or right inguinal area.  Genitourinary:    Penis: No tenderness.      Testes:        Right: Tenderness not present.        Left: Tenderness not present.     Epididymis:     Right: No tenderness.     Left: No tenderness.     Comments: Foley catheter is in place with dark urine coming from it.  No penile tenderness, groin tenderness, testicle tenderness, or crepitance.  No erythema seen.  No  drainage coming around the catheter site.  Patient otherwise well-appearing. Musculoskeletal:        General: No swelling.     Cervical back: Neck supple.  Skin:    General: Skin is warm and dry.  Neurological:     Mental Status: He is alert.  Psychiatric:        Mood and Affect: Mood normal.     ED Results / Procedures / Treatments   Labs (all labs ordered are listed, but only abnormal results are displayed) Labs Reviewed  URINALYSIS, W/ REFLEX TO CULTURE (INFECTION SUSPECTED) - Abnormal; Notable for the following components:      Result Value   Color, Urine AMBER (*)    Specific Gravity, Urine 1.002 (*)    Hgb urine dipstick MODERATE (*)    Protein, ur 30 (*)    Nitrite POSITIVE (*)    Leukocytes,Ua MODERATE (*)    Bacteria, UA FEW (*)    All other components within normal limits  URINE CULTURE    EKG None  Radiology No results found.  Procedures Procedures    Medications Ordered in ED Medications - No data to display  ED Course/ Medical Decision Making/ A&P                                 Medical Decision Making Amount and/or Complexity of Data Reviewed Labs: ordered.    Bryan Morgan is a 83 y.o. male with a past medical history significant for hyperlipidemia, hypertension, GERD, AAA, BPH status post transurethral resection of prostate with recent urinary retention and Foley catheter placement who presents with Foley catheter problem and darkened urine.  According to patient, he had a Foley catheter placed Avril days ago and has had dark urine coming from it.  He reports that intermittently it was having difficulty draining and was coming around the catheter from his penis.  He reports no burning or groin pain.  Denies testicle or scrotal pain.  Denies any abdominal pain nausea vomiting constipation or diarrhea.  Denies other complaints.  He called his urologist and came in for evaluation.  He reports that since arriving it is draining again but it is  darkened urine.  He denies any blood clot seen.  Denies any other rashes.  On exam, lungs clear.  Chest nontender.  Abdomen nontender.  With a chaperone, groin was examined and he had no penile or testicle tenderness.  No scrotal tenderness or swelling.  He has a catheter in place and there is urine coming from the catheter.  Given his otherwise well appearance and lack of complaints we agreed to hold on systemic lab workup but will get urinalysis and send  a culture.  While here, patient called urology who came to see the patient and they feel he is safe for discharge home.  His urinalysis did come back showing nitrates leukocytes and bacteria however the urology team did not feel that the patient needs antibiotics now but would rather him wait for the culture to return.  Based on their recommendations, will hold on treatment initially and wait for the culture.  He was post to see urology this week as well.  Patient agrees with plan and now that is draining well after flushing, he will be discharged for outpatient follow-up.         Final Clinical Impression(s) / ED Diagnoses Final diagnoses:  Problem with Foley catheter, initial encounter (HCC)    Rx / DC Orders ED Discharge Orders     None      Clinical Impression: 1. Problem with Foley catheter, initial encounter [facility])     Disposition: Discharge  Condition: Good  I have discussed the results, Dx and Tx plan with the pt(& family if present). He/she/they expressed understanding and agree(s) with the plan. Discharge instructions discussed at great length. Strict return precautions discussed and pt &/or family have verbalized understanding of the instructions. No further questions at time of discharge.    New Prescriptions   No medications on file    Follow Up: [facility] [address] [place]  [address] [phone]        Shawnise Peterkin, Marine Sia, MD 07/07/23 1300

## 2023-07-08 LAB — URINE CULTURE: Culture: NO GROWTH

## 2023-07-26 ENCOUNTER — Ambulatory Visit (INDEPENDENT_AMBULATORY_CARE_PROVIDER_SITE_OTHER): Payer: PPO | Admitting: Family Medicine

## 2023-07-26 VITALS — BP 121/73 | HR 94 | Ht 73.75 in | Wt 215.6 lb

## 2023-07-26 DIAGNOSIS — E559 Vitamin D deficiency, unspecified: Secondary | ICD-10-CM

## 2023-07-26 DIAGNOSIS — E785 Hyperlipidemia, unspecified: Secondary | ICD-10-CM

## 2023-07-26 DIAGNOSIS — E039 Hypothyroidism, unspecified: Secondary | ICD-10-CM

## 2023-07-26 DIAGNOSIS — N182 Chronic kidney disease, stage 2 (mild): Secondary | ICD-10-CM

## 2023-07-26 DIAGNOSIS — E1122 Type 2 diabetes mellitus with diabetic chronic kidney disease: Secondary | ICD-10-CM | POA: Diagnosis not present

## 2023-07-26 LAB — POCT GLYCOSYLATED HEMOGLOBIN (HGB A1C): HbA1c, POC (controlled diabetic range): 6.6 % (ref 0.0–7.0)

## 2023-07-26 NOTE — Progress Notes (Unsigned)
 New Patient Office Visit  Subjective    Patient ID: Bryan Morgan, male    DOB: 08/08/1940  Age: 83 y.o. MRN: [id number]  CC:  Chief Complaint  Patient presents with   New Patient (Initial Visit)   Annual Exam    HPI DALY EOFF presents to establish care and for review of chronic med issues including hypothyroidism and BPH. Patient is legally blind.    Outpatient Encounter Medications as of 07/26/2023  Medication Sig   Cholecalciferol (VITAMIN D -3 PO) Take 1 capsule by mouth at bedtime.   finasteride  (PROSCAR ) 5 MG tablet Take 5 mg by mouth daily.   levothyroxine  (SYNTHROID ) 100 MCG tablet Take  1 tablet  Daily  on an empty stomach with only water  for 30 minutes & no Antacid meds, Calcium  or Magnesium  for 4 hours & avoid Biotin (Patient taking differently: Take 100 mcg by mouth at bedtime.)   tamsulosin (FLOMAX) 0.4 MG CAPS capsule Take 0.4 mg by mouth at bedtime.   amoxicillin (AMOXIL) 875 MG tablet Take 875 mg by mouth 2 (two) times daily. (Patient not taking: Reported on 07/26/2023)   cephALEXin  (KEFLEX ) 500 MG capsule Take 1 capsule (500 mg total) by mouth daily after supper. (Patient not taking: Reported on 07/26/2023)   magnesium  citrate SOLN Take 30 mLs by mouth at bedtime. (Patient not taking: Reported on 07/26/2023)   No facility-administered encounter medications on file as of 07/26/2023.    Past Medical History:  Diagnosis Date   AAA (abdominal aortic aneurysm) (HCC)    BPH (benign prostatic hyperplasia)    with retention, s/p foley catheter placement in [facility] ED 12/29/20   Enlarged prostate    History of kidney stones    History of needle biopsy of prostate with negative result    Legal blindness    Positive colorectal cancer screening using Cologuard test 08/20/2018   08/18/2018 - GI referral placed; tubular & tubulovillous adenomas 09/12/18 colonoscopy, 3 year f/u rec    Past Surgical History:  Procedure Laterality Date   ABDOMINAL AORTIC ENDOVASCULAR  STENT GRAFT N/A 01/27/2021   Procedure: ABDOMINAL AORTIC ENDOVASCULAR STENT GRAFT REPAIR;  Surgeon: Carlene Che, MD;  Location: [facility] OR;  Service: Vascular;  Laterality: N/A;   CATARACT EXTRACTION, BILATERAL  2007   CYSTOSCOPY N/A 03/29/2021   Procedure: CYSTOSCOPY;  Surgeon: Sherlyn Ditto, MD;  Location: [facility];  Service: Urology;  Laterality: N/A;   IR RADIOLOGIST EVAL & MGMT  11/17/2022   TRANSURETHRAL RESECTION OF PROSTATE N/A 03/29/2021   Procedure: TRANSURETHRAL RESECTION OF THE PROSTATE (TURP);  Surgeon: Sherlyn Ditto, MD;  Location: [facility] ORS;  Service: Urology;  Laterality: N/A;   ULTRASOUND GUIDANCE FOR VASCULAR ACCESS Bilateral 01/27/2021   Procedure: ULTRASOUND GUIDANCE FOR VASCULAR ACCESS, BILATERAL FEMORAL ARTERIES;  Surgeon: Carlene Che, MD;  Location: [facility] OR;  Service: Vascular;  Laterality: Bilateral;    Family History  Problem Relation Age of Onset   Diabetes Sister    COPD Brother    Diabetes Sister    Colon cancer Neg Hx    Esophageal cancer Neg Hx    Stomach cancer Neg Hx    Pancreatic cancer Neg Hx    Liver disease Neg Hx     Social History   Socioeconomic History   Marital status: Married    Spouse name: Not on file   Number of children: 4   Years of education: Not on file   Highest education level: Not on file  Occupational History   Not on file  Tobacco Use   Smoking status: Never    Passive exposure: Never   Smokeless tobacco: Never  Vaping Use   Vaping status: Never Used  Substance and Sexual Activity   Alcohol use: Never   Drug use: No   Sexual activity: Yes    Partners: Female  Other Topics Concern   Not on file  Social History Narrative   Not on file   Social Drivers of Health   Financial Resource Strain: Not on file  Food Insecurity: No Food Insecurity (05/25/2023)   Hunger Vital Sign    Worried About Running Out of Food in the Last Year: Never true    Ran Out of Food in the Last Year: Never true  Transportation Needs: No  Transportation Needs (05/25/2023)   PRAPARE - Administrator, Civil Service (Medical): No    Lack of Transportation (Non-Medical): No  Physical Activity: Insufficiently Active (08/05/2018)   Exercise Vital Sign    Days of Exercise per Week: 1 day    Minutes of Exercise per Session: 120 min  Stress: No Stress Concern Present (08/05/2018)   [employer] of Occupational Health - Occupational Stress Questionnaire    Feeling of Stress : Only a little  Social Connections: Socially Integrated (05/25/2023)   Social Connection and Isolation Panel [NHANES]    Frequency of Communication with Friends and Family: More than three times a week    Frequency of Social Gatherings with Friends and Family: Once a week    Attends Religious Services: More than 4 times per year    Active Member of [employer] or Organizations: Yes    Attends Banker Meetings: 1 to 4 times per year    Marital Status: Married  Catering manager Violence: Not At Risk (05/25/2023)   Humiliation, Afraid, Rape, and Kick questionnaire    Fear of Current or Ex-Partner: No    Emotionally Abused: No    Physically Abused: No    Sexually Abused: No    Review of Systems  All other systems reviewed and are negative.       Objective   BP 121/73 (BP Location: Left Arm, Patient Position: Sitting, Cuff Size: Normal)   Pulse 94   Ht 6' 1.75" (1.873 m)   Wt 215 lb 9.6 oz (97.8 kg)   SpO2 94%   BMI 27.87 kg/m   Physical Exam Vitals and nursing note reviewed.  Constitutional:      General: He is not in acute distress. Cardiovascular:     Rate and Rhythm: Normal rate and regular rhythm.  Pulmonary:     Effort: Pulmonary effort is normal.     Breath sounds: Normal breath sounds.  Abdominal:     Palpations: Abdomen is soft.     Tenderness: There is no abdominal tenderness.  Musculoskeletal:     Cervical back: Normal range of motion and neck supple.  Neurological:     General: No focal deficit present.      Mental Status: He is alert and oriented to person, place, and time.         Assessment & Plan:   CKD stage 2 due to type 2 diabetes mellitus (HCC) -     POCT glycosylated hemoglobin (Hb A1C)  Hypothyroidism, unspecified type -     TSH + free T4 -     CMP14+EGFR  Hyperlipidemia, unspecified hyperlipidemia type -     Lipid panel  Vitamin D  deficiency -  VITAMIN D  25 Hydroxy (Vit-D Deficiency, Fractures)     Return in about 3 months (around 10/26/2023) for follow up, chronic med issues.   Arlo Lama, MD

## 2023-07-27 LAB — CMP14+EGFR
ALT: 17 IU/L (ref 0–44)
AST: 19 IU/L (ref 0–40)
Albumin: 4.2 g/dL (ref 3.7–4.7)
Alkaline Phosphatase: 64 IU/L (ref 44–121)
BUN/Creatinine Ratio: 15 (ref 10–24)
BUN: 19 mg/dL (ref 8–27)
Bilirubin Total: 0.3 mg/dL (ref 0.0–1.2)
CO2: 23 mmol/L (ref 20–29)
Calcium: 9.8 mg/dL (ref 8.6–10.2)
Chloride: 100 mmol/L (ref 96–106)
Creatinine, Ser: 1.3 mg/dL — ABNORMAL HIGH (ref 0.76–1.27)
Globulin, Total: 3.2 g/dL (ref 1.5–4.5)
Glucose: 195 mg/dL — ABNORMAL HIGH (ref 70–99)
Potassium: 4.2 mmol/L (ref 3.5–5.2)
Sodium: 138 mmol/L (ref 134–144)
Total Protein: 7.4 g/dL (ref 6.0–8.5)
eGFR: 55 mL/min/{1.73_m2} — ABNORMAL LOW (ref >59)

## 2023-07-27 LAB — LIPID PANEL
Chol/HDL Ratio: 5.5 ratio — ABNORMAL HIGH (ref 0.0–5.0)
Cholesterol, Total: 203 mg/dL — ABNORMAL HIGH (ref 100–199)
HDL: 37 mg/dL — ABNORMAL LOW (ref >39)
LDL Chol Calc (NIH): 126 mg/dL — ABNORMAL HIGH (ref 0–99)
Triglycerides: 223 mg/dL — ABNORMAL HIGH (ref 0–149)
VLDL Cholesterol Cal: 40 mg/dL (ref 5–40)

## 2023-07-27 LAB — VITAMIN D 25 HYDROXY (VIT D DEFICIENCY, FRACTURES): Vit D, 25-Hydroxy: 100 ng/mL (ref 30.0–100.0)

## 2023-07-27 LAB — TSH+FREE T4
Free T4: 1.27 ng/dL (ref 0.82–1.77)
TSH: 0.381 u[IU]/mL — ABNORMAL LOW (ref 0.450–4.500)

## 2023-07-31 ENCOUNTER — Encounter: Payer: Self-pay | Admitting: Family Medicine

## 2023-10-05 ENCOUNTER — Ambulatory Visit: Payer: PPO | Admitting: Nurse Practitioner

## 2023-11-01 ENCOUNTER — Ambulatory Visit: Payer: PPO | Admitting: Nurse Practitioner

## 2024-01-18 ENCOUNTER — Other Ambulatory Visit: Payer: Self-pay | Admitting: Interventional Radiology

## 2024-01-18 DIAGNOSIS — I9789 Other postprocedural complications and disorders of the circulatory system, not elsewhere classified: Secondary | ICD-10-CM

## 2024-01-18 DIAGNOSIS — Z9889 Other specified postprocedural states: Secondary | ICD-10-CM

## 2024-01-21 ENCOUNTER — Ambulatory Visit (INDEPENDENT_AMBULATORY_CARE_PROVIDER_SITE_OTHER)

## 2024-01-21 VITALS — Ht 73.75 in | Wt 215.0 lb

## 2024-01-21 DIAGNOSIS — Z0000 Encounter for general adult medical examination without abnormal findings: Secondary | ICD-10-CM

## 2024-01-21 NOTE — Progress Notes (Signed)
 Subjective:   Bryan Morgan is a 83 y.o. who presents for a Medicare Wellness preventive visit.  As a reminder, Annual Wellness Visits don't include a physical exam, and some assessments may be limited, especially if this visit is performed virtually. We may recommend an in-person follow-up visit with your provider if needed.  Visit Complete: Virtual I connected with  Bryan Morgan on 01/21/24 by a audio enabled telemedicine application and verified that I am speaking with the correct person using two identifiers.  Patient Location: Home  Provider Location: Home Office  I discussed the limitations of evaluation and management by telemedicine. The patient expressed understanding and agreed to proceed.  Vital Signs: Because this visit was a virtual/telehealth visit, some criteria may be missing or patient reported. Any vitals not documented were not able to be obtained and vitals that have been documented are patient reported.  VideoDeclined- This patient declined Librarian, academic. Therefore the visit was completed with audio only.  Persons Participating in Visit: Patient.  AWV Questionnaire: No: Patient Medicare AWV questionnaire was not completed prior to this visit.  Cardiac Risk Factors include: advanced age (>72men, >31 women);diabetes mellitus;dyslipidemia;male gender;Other (see comment), Risk factor comments: BPH     Objective:    Today's Vitals   01/21/24 0917  Weight: 215 lb (97.5 kg)  Height: 6' 1.75 (1.873 m)   Body mass index is 27.79 kg/m.     01/21/2024    9:42 AM 07/07/2023   10:29 AM 05/25/2023    8:00 PM 05/07/2023    9:06 AM 04/20/2023    8:38 PM 04/03/2023    9:16 AM 04/03/2023    8:11 AM  Advanced Directives  Does Patient Have a Medical Advance Directive? Yes No Yes Yes Yes Yes No  Type of Advance Directive Healthcare Power of Attorney  Living will;Healthcare Power of [employer] Power of [place];Living  will Living will Living will   Does patient want to make changes to medical advance directive?   No - Patient declined   No - Patient declined   Copy of Healthcare Power of Attorney in Chart? No - copy requested  No - copy requested      Would patient like information on creating a medical advance directive?  No - Patient declined         Current Medications (verified) Outpatient Encounter Medications as of 01/21/2024  Medication Sig   Cholecalciferol (VITAMIN D -3 PO) Take 1 capsule by mouth at bedtime.   levothyroxine  (SYNTHROID ) 100 MCG tablet Take  1 tablet  Daily  on an empty stomach with only water  for 30 minutes & no Antacid meds, Calcium  or Magnesium  for 4 hours & avoid Biotin   magnesium  citrate SOLN Take 30 mLs by mouth at bedtime.   amoxicillin (AMOXIL) 875 MG tablet Take 875 mg by mouth 2 (two) times daily. (Patient not taking: Reported on 01/21/2024)   cephALEXin  (KEFLEX ) 500 MG capsule Take 1 capsule (500 mg total) by mouth daily after supper. (Patient not taking: Reported on 01/21/2024)   finasteride  (PROSCAR ) 5 MG tablet Take 5 mg by mouth daily. (Patient not taking: Reported on 01/21/2024)   tamsulosin (FLOMAX) 0.4 MG CAPS capsule Take 0.4 mg by mouth at bedtime. (Patient not taking: Reported on 01/21/2024)   No facility-administered encounter medications on file as of 01/21/2024.    Allergies (verified) Patient has no known allergies.   History: Past Medical History:  Diagnosis Date   AAA (abdominal aortic aneurysm)  BPH (benign prostatic hyperplasia)    with retention, s/p foley catheter placement in [facility] 12/29/20   Enlarged prostate    History of kidney stones    History of needle biopsy of prostate with negative result    Legal blindness    Positive colorectal cancer screening using Cologuard test 08/20/2018   08/18/2018 - GI referral placed; tubular & tubulovillous adenomas 09/12/18 colonoscopy, 3 year f/u rec   Past Surgical History:  Procedure  Laterality Date   ABDOMINAL AORTIC ENDOVASCULAR STENT GRAFT N/A 01/27/2021   Procedure: ABDOMINAL AORTIC ENDOVASCULAR STENT GRAFT REPAIR;  Surgeon: Magda Debby SAILOR, MD;  Location: [facility] OR;  Service: Vascular;  Laterality: N/A;   CATARACT EXTRACTION, BILATERAL  2007   CYSTOSCOPY N/A 03/29/2021   Procedure: CYSTOSCOPY;  Surgeon: Rosalind Zachary NOVAK, MD;  Location: [facility] ORS;  Service: Urology;  Laterality: N/A;   IR RADIOLOGIST EVAL & MGMT  11/17/2022   TRANSURETHRAL RESECTION OF PROSTATE N/A 03/29/2021   Procedure: TRANSURETHRAL RESECTION OF THE PROSTATE (TURP);  Surgeon: Rosalind Zachary NOVAK, MD;  Location: [facility] ORS;  Service: Urology;  Laterality: N/A;   ULTRASOUND GUIDANCE FOR VASCULAR ACCESS Bilateral 01/27/2021   Procedure: ULTRASOUND GUIDANCE FOR VASCULAR ACCESS, BILATERAL FEMORAL ARTERIES;  Surgeon: Magda Debby SAILOR, MD;  Location: [facility] OR;  Service: Vascular;  Laterality: Bilateral;   Family History  Problem Relation Age of Onset   Diabetes Sister    COPD Brother    Diabetes Sister    Colon cancer Neg Hx    Esophageal cancer Neg Hx    Stomach cancer Neg Hx    Pancreatic cancer Neg Hx    Liver disease Neg Hx    Social History   Socioeconomic History   Marital status: Married    Spouse name: Bryan Morgan   Number of children: 4   Years of education: Not on file   Highest education level: Not on file  Occupational History   Occupation: RETIRED  Tobacco Use   Smoking status: Never    Passive exposure: Never   Smokeless tobacco: Never  Vaping Use   Vaping status: Never Used  Substance and Sexual Activity   Alcohol use: Never   Drug use: No   Sexual activity: Yes    Partners: Female  Other Topics Concern   Not on file  Social History Narrative   Lives with wife and granddaughter/2025   Social Drivers of Health   Financial Resource Strain: Low Risk  (01/21/2024)   Overall Financial Resource Strain (CARDIA)    Difficulty of Paying Living Expenses: Not hard at all  Food Insecurity: No Food  Insecurity (01/21/2024)   Hunger Vital Sign    Worried About Running Out of Food in the Last Year: Never true    Ran Out of Food in the Last Year: Never true  Transportation Needs: No Transportation Needs (01/21/2024)   PRAPARE - Administrator, Civil Service (Medical): No    Lack of Transportation (Non-Medical): No  Physical Activity: Sufficiently Active (01/21/2024)   Exercise Vital Sign    Days of Exercise per Week: 7 days    Minutes of Exercise per Session: 30 min  Stress: No Stress Concern Present (01/21/2024)   [employer] of Occupational Health - Occupational Stress Questionnaire    Feeling of Stress: Not at all  Social Connections: Moderately Integrated (01/21/2024)   Social Connection and Isolation Panel    Frequency of Communication with Friends and Family: More than three times a week  Frequency of Social Gatherings with Friends and Family: Once a week    Attends Religious Services: Never    Database Administrator or Organizations: Yes    Attends Banker Meetings: Never    Marital Status: Married    Tobacco Counseling Counseling given: Not Answered    Clinical Intake:  Pre-visit preparation completed: Yes  Pain : No/denies pain     BMI - recorded: 27.79 Nutritional Status: BMI 25 -29 Overweight Nutritional Risks: None Diabetes: Yes CBG done?: No Did pt. bring in CBG monitor from home?: No  Lab Results  Component Value Date   HGBA1C 6.6 07/26/2023   HGBA1C 6.9 (H) 02/19/2023   HGBA1C 6.9 (H) 11/01/2022     How often do you need to have someone help you when you read instructions, pamphlets, or other written materials from your doctor or pharmacy?: 1 - Never  Interpreter Needed?: No  Information entered by :: Kellyann Ordway, RMA   Activities of Daily Living     01/21/2024    9:34 AM 05/25/2023    8:00 PM  In your present state of health, do you have any difficulty performing the following activities:  Hearing? 1 0   Comment Wears hearing aides sometimes   Vision? 0 1  Difficulty concentrating or making decisions? 0 0  Walking or climbing stairs? 0   Dressing or bathing? 0   Doing errands, shopping? 0 1  Comment public transportation   Preparing Food and eating ? N   Using the Toilet? N   In the past six months, have you accidently leaked urine? N   Do you have problems with loss of bowel control? N   Managing your Medications? N   Managing your Finances? N   Housekeeping or managing your Housekeeping? N     Patient Care Team: Tanda Bleacher, MD as PCP - General (Family Medicine)  I have updated your Care Teams any recent Medical Services you may have received from other providers in the past year.     Assessment:   This is a routine wellness examination for Jaydin.  Hearing/Vision screen Hearing Screening - Comments:: Wears hearing aides sometimes Vision Screening - Comments:: Legally blind/wears eyeglasses/Dr. Street   Goals Addressed             This Visit's Progress    DIET - INCREASE WATER  INTAKE   On track    65-80+ fluid ounces of fluids        Depression Screen     01/21/2024    9:47 AM 07/26/2023    8:46 AM 02/18/2023    8:00 PM 11/01/2022   11:20 AM 07/01/2022    8:44 PM 10/03/2021   10:07 AM 06/16/2021   10:13 PM  PHQ 2/9 Scores  PHQ - 2 Score 0 0 0 0 0 0 0  PHQ- 9 Score 0          Fall Risk     01/21/2024    9:43 AM 07/26/2023    8:22 AM 02/18/2023    8:00 PM 11/01/2022   11:20 AM 07/01/2022    8:44 PM  Fall Risk   Falls in the past year? 0 0  0 0  Number falls in past yr: 0 0     Injury with Fall? 0 0     Risk for fall due to :  No Fall Risks No Fall Risks;Impaired vision  Impaired vision  Follow up Falls evaluation completed;Falls prevention discussed Falls evaluation  completed Falls prevention discussed;Education provided;Falls evaluation completed  Falls evaluation completed;Education provided;Falls prevention discussed    MEDICARE RISK AT HOME:   Medicare Risk at Home Any stairs in or around the home?: No If so, are there any without handrails?: No Home free of loose throw rugs in walkways, pet beds, electrical cords, etc?: Yes Adequate lighting in your home to reduce risk of falls?: Yes Life alert?: No Use of a cane, walker or w/c?: No Grab bars in the bathroom?: Yes Shower chair or bench in shower?: Yes Elevated toilet seat or a handicapped toilet?: No  TIMED UP AND GO:  Was the test performed?  No  Cognitive Function: 6CIT completed        01/21/2024    9:43 AM  6CIT Screen  What time? 0 points  Count back from 20 0 points  Months in reverse 0 points  Repeat phrase 2 points    Immunizations Immunization History  Administered Date(s) Administered   Janssen (J&J) SARS-COV-2 Vaccination 08/16/2019, 04/02/2020    Screening Tests Health Maintenance  Topic Date Due   OPHTHALMOLOGY EXAM  Never done   DTaP/Tdap/Td (1 - Tdap) Never done   Pneumococcal Vaccine: 50+ Years (1 of 2 - PCV) Never done   Zoster Vaccines- Shingrix (1 of 2) Never done   Colonoscopy  09/11/2021   FOOT EXAM  06/29/2023   Diabetic kidney evaluation - Urine ACR  06/30/2023   Influenza Vaccine  Never done   COVID-19 Vaccine (3 - 2025-26 season) 11/26/2023   HEMOGLOBIN A1C  01/26/2024   Diabetic kidney evaluation - eGFR measurement  07/25/2024   Medicare Annual Wellness (AWV)  01/20/2025   Meningococcal B Vaccine  Aged Out   Hepatitis C Screening  Discontinued    Health Maintenance Items Addressed: Diabetic Foot Exam recommended, Labs Due UACR, See Nurse Notes at the end of this note  Additional Screening:  Vision Screening: Recommended annual ophthalmology exams for early detection of glaucoma and other disorders of the eye. Is the patient up to date with their annual eye exam?  No  Who is the provider or what is the name of the office in which the patient attends annual eye exams? Dr. Katrine not up to date.   Dental  Screening: Recommended annual dental exams for proper oral hygiene  Community Resource Referral / Chronic Care Management: CRR required this visit?  No   CCM required this visit?  No   Plan:    I have personally reviewed and noted the following in the patient's chart:   Medical and social history Use of alcohol, tobacco or illicit drugs  Current medications and supplements including opioid prescriptions. Patient is not currently taking opioid prescriptions. Functional ability and status Nutritional status Physical activity Advanced directives List of other physicians Hospitalizations, surgeries, and ER visits in previous 12 months Vitals Screenings to include cognitive, depression, and falls Referrals and appointments  In addition, I have reviewed and discussed with patient certain preventive protocols, quality metrics, and best practice recommendations. A written personalized care plan for preventive services as well as general preventive health recommendations were provided to patient.   Shalaunda Weatherholtz L Oaklie Durrett, CMA   01/21/2024   After Visit Summary: (Mail) Due to this being a telephonic visit, the after visit summary with patients personalized plan was offered to patient via mail   Notes: Patient declines all due vaccines.  He also declines colonoscopy.  Patient is due for a diabetic eye exam.  He is also due for  a foot exam and a UACR, which can be done in the office during his next office visit.

## 2024-01-21 NOTE — Patient Instructions (Addendum)
 Bryan Morgan,  Thank you for taking the time for your Medicare Wellness Visit. I appreciate your continued commitment to your health goals. Please review the care plan we discussed, and feel free to reach out if I can assist you further.  Medicare recommends these wellness visits once per year to help you and your care team stay ahead of potential health issues. These visits are designed to focus on prevention, allowing your provider to concentrate on managing your acute and chronic conditions during your regular appointments.  Please note that Annual Wellness Visits do not include a physical exam. Some assessments may be limited, especially if the visit was conducted virtually. If needed, we may recommend a separate in-person follow-up with your provider.  Ongoing Care Seeing your primary care provider every 3 to 6 months helps us  monitor your health and provide consistent, personalized care. Next office visit on 01/28/2024.  You are due for a diabetic eye exam.  Please call your eye doctor and get scheduled.  You are also due for a foot exam and a kidney evaluation, which you will get during your next office visit.    Referrals If a referral was made during today's visit and you haven't received any updates within two weeks, please contact the referred provider directly to check on the status.  Recommended Screenings:  Health Maintenance  Topic Date Due   Eye exam for diabetics  Never done   DTaP/Tdap/Td vaccine (1 - Tdap) Never done   Pneumococcal Vaccine for age over 65 (1 of 2 - PCV) Never done   Zoster (Shingles) Vaccine (1 of 2) Never done   Colon Cancer Screening  09/11/2021   Complete foot exam   06/29/2023   Yearly kidney health urinalysis for diabetes  06/30/2023   Flu Shot  Never done   COVID-19 Vaccine (3 - 2025-26 season) 11/26/2023   Hemoglobin A1C  01/26/2024   Yearly kidney function blood test for diabetes  07/25/2024   Medicare Annual Wellness Visit  01/20/2025    Meningitis B Vaccine  Aged Out   Hepatitis C Screening  Discontinued       01/21/2024    9:42 AM  Advanced Directives  Does Patient Have a Medical Advance Directive? Yes  Type of Advance Directive Healthcare Power of Attorney  Copy of Healthcare Power of Attorney in Chart? No - copy requested   Advance Care Planning is important because it: Ensures you receive medical care that aligns with your values, goals, and preferences. Provides guidance to your family and loved ones, reducing the emotional burden of decision-making during critical moments.  Vision: Annual vision screenings are recommended for early detection of glaucoma, cataracts, and diabetic retinopathy. These exams can also reveal signs of chronic conditions such as diabetes and high blood pressure.  Dental: Annual dental screenings help detect early signs of oral cancer, gum disease, and other conditions linked to overall health, including heart disease and diabetes.  Please see the attached documents for additional preventive care recommendations.

## 2024-01-28 ENCOUNTER — Encounter: Payer: Self-pay | Admitting: Family Medicine

## 2024-01-28 ENCOUNTER — Ambulatory Visit (INDEPENDENT_AMBULATORY_CARE_PROVIDER_SITE_OTHER): Admitting: Family Medicine

## 2024-01-28 VITALS — BP 134/83 | HR 93 | Ht 73.75 in | Wt 219.0 lb

## 2024-01-28 DIAGNOSIS — N182 Chronic kidney disease, stage 2 (mild): Secondary | ICD-10-CM

## 2024-01-28 DIAGNOSIS — Z8639 Personal history of other endocrine, nutritional and metabolic disease: Secondary | ICD-10-CM

## 2024-01-28 DIAGNOSIS — E559 Vitamin D deficiency, unspecified: Secondary | ICD-10-CM | POA: Diagnosis not present

## 2024-01-28 DIAGNOSIS — E039 Hypothyroidism, unspecified: Secondary | ICD-10-CM

## 2024-01-28 DIAGNOSIS — E785 Hyperlipidemia, unspecified: Secondary | ICD-10-CM

## 2024-01-28 DIAGNOSIS — E1122 Type 2 diabetes mellitus with diabetic chronic kidney disease: Secondary | ICD-10-CM | POA: Diagnosis not present

## 2024-01-28 DIAGNOSIS — H548 Legal blindness, as defined in USA: Secondary | ICD-10-CM

## 2024-01-28 DIAGNOSIS — N400 Enlarged prostate without lower urinary tract symptoms: Secondary | ICD-10-CM

## 2024-01-28 NOTE — Progress Notes (Unsigned)
 Established Patient Office Visit  Subjective    Patient ID: NYAIR DEPAULO, male    DOB: 04-Jul-1940  Age: 83 y.o. MRN: [id number]  CC:  Chief Complaint  Patient presents with   Medical Management of Chronic Issues    HPI Bryan Morgan presents for routine follow up of chronic med issues including diabetes and hypothyroidism. Patient  reports med compliance and denies acute complaints.   Outpatient Encounter Medications as of 01/28/2024  Medication Sig   Cholecalciferol (VITAMIN D -3 PO) Take 1 capsule by mouth at bedtime.   levothyroxine  (SYNTHROID ) 100 MCG tablet Take  1 tablet  Daily  on an empty stomach with only water  for 30 minutes & no Antacid meds, Calcium  or Magnesium  for 4 hours & avoid Biotin (Patient not taking: Reported on 01/28/2024)   magnesium  citrate SOLN Take 30 mLs by mouth at bedtime.   [DISCONTINUED] amoxicillin (AMOXIL) 875 MG tablet Take 875 mg by mouth 2 (two) times daily. (Patient not taking: Reported on 01/21/2024)   [DISCONTINUED] cephALEXin  (KEFLEX ) 500 MG capsule Take 1 capsule (500 mg total) by mouth daily after supper. (Patient not taking: Reported on 01/21/2024)   [DISCONTINUED] finasteride  (PROSCAR ) 5 MG tablet Take 5 mg by mouth daily. (Patient not taking: Reported on 01/28/2024)   [DISCONTINUED] tamsulosin (FLOMAX) 0.4 MG CAPS capsule Take 0.4 mg by mouth at bedtime. (Patient not taking: Reported on 01/21/2024)   No facility-administered encounter medications on file as of 01/28/2024.    Past Medical History:  Diagnosis Date   AAA (abdominal aortic aneurysm)    BPH (benign prostatic hyperplasia)    with retention, s/p foley catheter placement in [facility] ED 12/29/20   Enlarged prostate    History of kidney stones    History of needle biopsy of prostate with negative result    Legal blindness    Positive colorectal cancer screening using Cologuard test 08/20/2018   08/18/2018 - GI referral placed; tubular & tubulovillous adenomas 09/12/18  colonoscopy, 3 year f/u rec    Past Surgical History:  Procedure Laterality Date   ABDOMINAL AORTIC ENDOVASCULAR STENT GRAFT N/A 01/27/2021   Procedure: ABDOMINAL AORTIC ENDOVASCULAR STENT GRAFT REPAIR;  Surgeon: Magda Debby SAILOR, MD;  Location: [facility] OR;  Service: Vascular;  Laterality: N/A;   CATARACT EXTRACTION, BILATERAL  2007   CYSTOSCOPY N/A 03/29/2021   Procedure: CYSTOSCOPY;  Surgeon: Rosalind Zachary NOVAK, MD;  Location: [facility];  Service: Urology;  Laterality: N/A;   IR RADIOLOGIST EVAL & MGMT  11/17/2022   TRANSURETHRAL RESECTION OF PROSTATE N/A 03/29/2021   Procedure: TRANSURETHRAL RESECTION OF THE PROSTATE (TURP);  Surgeon: Rosalind Zachary NOVAK, MD;  Location: [facility] ORS;  Service: Urology;  Laterality: N/A;   ULTRASOUND GUIDANCE FOR VASCULAR ACCESS Bilateral 01/27/2021   Procedure: ULTRASOUND GUIDANCE FOR VASCULAR ACCESS, BILATERAL FEMORAL ARTERIES;  Surgeon: Magda Debby SAILOR, MD;  Location: [facility] OR;  Service: Vascular;  Laterality: Bilateral;    Family History  Problem Relation Age of Onset   Diabetes Sister    COPD Brother    Diabetes Sister    Colon cancer Neg Hx    Esophageal cancer Neg Hx    Stomach cancer Neg Hx    Pancreatic cancer Neg Hx    Liver disease Neg Hx     Social History   Socioeconomic History   Marital status: Married    Spouse name: Donzell   Number of children: 4   Years of education: Not on file   Highest education level: Not on  file  Occupational History   Occupation: RETIRED  Tobacco Use   Smoking status: Never    Passive exposure: Never   Smokeless tobacco: Never  Vaping Use   Vaping status: Never Used  Substance and Sexual Activity   Alcohol use: Never   Drug use: No   Sexual activity: Yes    Partners: Female  Other Topics Concern   Not on file  Social History Narrative   Lives with wife and granddaughter/2025   Social Drivers of Health   Financial Resource Strain: Low Risk  (01/21/2024)   Overall Financial Resource Strain (CARDIA)     Difficulty of Paying Living Expenses: Not hard at all  Food Insecurity: No Food Insecurity (01/21/2024)   Hunger Vital Sign    Worried About Running Out of Food in the Last Year: Never true    Ran Out of Food in the Last Year: Never true  Transportation Needs: No Transportation Needs (01/21/2024)   PRAPARE - Administrator, Civil Service (Medical): No    Lack of Transportation (Non-Medical): No  Physical Activity: Sufficiently Active (01/21/2024)   Exercise Vital Sign    Days of Exercise per Week: 7 days    Minutes of Exercise per Session: 30 min  Stress: No Stress Concern Present (01/21/2024)   [employer] of Occupational Health - Occupational Stress Questionnaire    Feeling of Stress: Not at all  Social Connections: Moderately Integrated (01/21/2024)   Social Connection and Isolation Panel    Frequency of Communication with Friends and Family: More than three times a week    Frequency of Social Gatherings with Friends and Family: Once a week    Attends Religious Services: Never    Database Administrator or Organizations: Yes    Attends Banker Meetings: Never    Marital Status: Married  Catering Manager Violence: Not At Risk (01/21/2024)   Humiliation, Afraid, Rape, and Kick questionnaire    Fear of Current or Ex-Partner: No    Emotionally Abused: No    Physically Abused: No    Sexually Abused: No    Review of Systems  All other systems reviewed and are negative.       Objective    BP 134/83   Pulse 93   Ht 6' 1.75 (1.873 m)   Wt 219 lb (99.3 kg)   SpO2 93%   BMI 28.31 kg/m   Physical Exam Vitals and nursing note reviewed.  Constitutional:      General: He is not in acute distress. Cardiovascular:     Rate and Rhythm: Normal rate and regular rhythm.  Pulmonary:     Effort: Pulmonary effort is normal.     Breath sounds: Normal breath sounds.  Abdominal:     Palpations: Abdomen is soft.     Tenderness: There is no abdominal  tenderness.  Musculoskeletal:     Cervical back: Normal range of motion and neck supple.  Neurological:     General: No focal deficit present.     Mental Status: He is alert and oriented to person, place, and time.         Assessment & Plan:   History of diet-controlled diabetes -     Hemoglobin A1c -     Comprehensive metabolic panel with GFR -     Lipid panel  Hypothyroidism, unspecified type -     TSH + free T4  Vitamin D  deficiency -     VITAMIN D  25 Hydroxy (Vit-D  Deficiency, Fractures)  CKD stage 2 due to type 2 diabetes mellitus (HCC)  Hyperlipidemia, unspecified hyperlipidemia type  Benign prostatic hyperplasia without lower urinary tract symptoms  Legal blindness     Return in about 6 months (around 07/27/2024) for follow up.   Tanda Raguel SQUIBB, MD

## 2024-01-29 LAB — COMPREHENSIVE METABOLIC PANEL WITH GFR
ALT: 14 IU/L (ref 0–44)
AST: 15 IU/L (ref 0–40)
Albumin: 4.3 g/dL (ref 3.7–4.7)
Alkaline Phosphatase: 60 IU/L (ref 48–129)
BUN/Creatinine Ratio: 14 (ref 10–24)
BUN: 18 mg/dL (ref 8–27)
Bilirubin Total: 0.4 mg/dL (ref 0.0–1.2)
CO2: 22 mmol/L (ref 20–29)
Calcium: 10 mg/dL (ref 8.6–10.2)
Chloride: 98 mmol/L (ref 96–106)
Creatinine, Ser: 1.3 mg/dL — ABNORMAL HIGH (ref 0.76–1.27)
Globulin, Total: 2.9 g/dL (ref 1.5–4.5)
Glucose: 238 mg/dL — ABNORMAL HIGH (ref 70–99)
Potassium: 4.6 mmol/L (ref 3.5–5.2)
Sodium: 136 mmol/L (ref 134–144)
Total Protein: 7.2 g/dL (ref 6.0–8.5)
eGFR: 55 mL/min/1.73 — ABNORMAL LOW (ref >59)

## 2024-01-29 LAB — LIPID PANEL
Chol/HDL Ratio: 5.8 ratio — ABNORMAL HIGH (ref 0.0–5.0)
Cholesterol, Total: 203 mg/dL — ABNORMAL HIGH (ref 100–199)
HDL: 35 mg/dL — ABNORMAL LOW (ref >39)
LDL Chol Calc (NIH): 119 mg/dL — ABNORMAL HIGH (ref 0–99)
Triglycerides: 282 mg/dL — ABNORMAL HIGH (ref 0–149)
VLDL Cholesterol Cal: 49 mg/dL — ABNORMAL HIGH (ref 5–40)

## 2024-01-29 LAB — HEMOGLOBIN A1C
Est. average glucose Bld gHb Est-mCnc: 151 mg/dL
Hgb A1c MFr Bld: 6.9 % — ABNORMAL HIGH (ref 4.8–5.6)

## 2024-01-29 LAB — TSH+FREE T4
Free T4: 0.95 ng/dL (ref 0.82–1.77)
TSH: 4.8 u[IU]/mL — ABNORMAL HIGH (ref 0.450–4.500)

## 2024-01-29 LAB — VITAMIN D 25 HYDROXY (VIT D DEFICIENCY, FRACTURES): Vit D, 25-Hydroxy: 74.4 ng/mL (ref 30.0–100.0)

## 2024-01-30 ENCOUNTER — Encounter: Payer: Self-pay | Admitting: Family Medicine

## 2024-02-06 ENCOUNTER — Other Ambulatory Visit: Payer: Self-pay | Admitting: Family Medicine

## 2024-02-06 ENCOUNTER — Ambulatory Visit: Payer: Self-pay | Admitting: Family Medicine

## 2024-02-06 MED ORDER — LEVOTHYROXINE SODIUM 112 MCG PO TABS: 112.0000 ug | ORAL_TABLET | Freq: Every day | ORAL | 0 refills | Status: AC

## 2024-02-08 NOTE — Progress Notes (Signed)
 Spoke to [employer] over the phone and advised of lab results and recommendations. Pt expressed understanding. Stated they picked up new prescription of synthroid  this morning

## 2024-06-03 ENCOUNTER — Ambulatory Visit (HOSPITAL_COMMUNITY)

## 2024-06-03 ENCOUNTER — Ambulatory Visit

## 2024-07-28 ENCOUNTER — Ambulatory Visit: Admitting: Family Medicine
